# Patient Record
Sex: Female | Born: 1990 | Race: Black or African American | Hispanic: No | Marital: Married | Smoking: Never smoker
Health system: Southern US, Community
[De-identification: ages and names within clinical notes are randomized; demographics above are authoritative.]

## PROBLEM LIST (undated history)

## (undated) ENCOUNTER — Inpatient Hospital Stay (HOSPITAL_COMMUNITY): Payer: Self-pay

## (undated) DIAGNOSIS — K219 Gastro-esophageal reflux disease without esophagitis: Secondary | ICD-10-CM

## (undated) DIAGNOSIS — F419 Anxiety disorder, unspecified: Secondary | ICD-10-CM

## (undated) DIAGNOSIS — J45909 Unspecified asthma, uncomplicated: Secondary | ICD-10-CM

## (undated) DIAGNOSIS — F53 Postpartum depression: Secondary | ICD-10-CM

## (undated) HISTORY — PX: BREAST SURGERY: SHX581

---
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
## 2009-03-17 ENCOUNTER — Other Ambulatory Visit: Admission: RE | Admit: 2009-03-17 | Discharge: 2009-03-17 | Payer: Self-pay | Admitting: Obstetrics and Gynecology

## 2010-03-24 ENCOUNTER — Other Ambulatory Visit: Admission: RE | Admit: 2010-03-24 | Discharge: 2010-03-24 | Payer: Self-pay | Admitting: Obstetrics and Gynecology

## 2011-05-23 ENCOUNTER — Other Ambulatory Visit (HOSPITAL_COMMUNITY)
Admission: RE | Admit: 2011-05-23 | Discharge: 2011-05-23 | Disposition: A | Discharge status: Home / Self Care | Payer: 59 | Source: Ambulatory Visit | Attending: Obstetrics and Gynecology | Admitting: Obstetrics and Gynecology

## 2011-05-23 DIAGNOSIS — Z01419 Encounter for gynecological examination (general) (routine) without abnormal findings: Secondary | ICD-10-CM | POA: Insufficient documentation

## 2011-05-23 DIAGNOSIS — Z113 Encounter for screening for infections with a predominantly sexual mode of transmission: Secondary | ICD-10-CM | POA: Insufficient documentation

## 2012-07-30 ENCOUNTER — Other Ambulatory Visit (HOSPITAL_COMMUNITY)
Admission: RE | Admit: 2012-07-30 | Discharge: 2012-07-30 | Disposition: A | Discharge status: Home / Self Care | Payer: 59 | Source: Ambulatory Visit | Attending: Obstetrics and Gynecology | Admitting: Obstetrics and Gynecology

## 2012-07-30 DIAGNOSIS — Z01419 Encounter for gynecological examination (general) (routine) without abnormal findings: Secondary | ICD-10-CM | POA: Insufficient documentation

## 2014-08-10 ENCOUNTER — Encounter (HOSPITAL_COMMUNITY): Payer: Self-pay

## 2014-08-10 ENCOUNTER — Inpatient Hospital Stay (HOSPITAL_COMMUNITY)
Admission: AD | Admit: 2014-08-10 | Discharge: 2014-08-10 | Disposition: A | Discharge status: Home / Self Care | Payer: BC Managed Care – PPO | Source: Ambulatory Visit | Attending: Obstetrics and Gynecology | Admitting: Obstetrics and Gynecology

## 2014-08-10 DIAGNOSIS — K219 Gastro-esophageal reflux disease without esophagitis: Secondary | ICD-10-CM | POA: Insufficient documentation

## 2014-08-10 DIAGNOSIS — O210 Mild hyperemesis gravidarum: Secondary | ICD-10-CM | POA: Diagnosis not present

## 2014-08-10 DIAGNOSIS — O219 Vomiting of pregnancy, unspecified: Secondary | ICD-10-CM | POA: Diagnosis not present

## 2014-08-10 HISTORY — DX: Unspecified asthma, uncomplicated: J45.909

## 2014-08-10 HISTORY — DX: Anxiety disorder, unspecified: F41.9

## 2014-08-10 HISTORY — DX: Gastro-esophageal reflux disease without esophagitis: K21.9

## 2014-08-10 LAB — URINALYSIS, ROUTINE W REFLEX MICROSCOPIC
Glucose, UA: NEGATIVE mg/dL
Hgb urine dipstick: NEGATIVE
Ketones, ur: >80 mg/dL — AB
Leukocytes, UA: NEGATIVE
Nitrite: NEGATIVE
Protein, ur: NEGATIVE mg/dL
Specific Gravity, Urine: 1.015 (ref 1.005–1.030)
Urobilinogen, UA: 1 mg/dL (ref 0.0–1.0)
pH: 7 (ref 5.0–8.0)

## 2014-08-10 LAB — POCT PREGNANCY, URINE: Preg Test, Ur: POSITIVE — AB

## 2014-08-10 MED ORDER — PROMETHAZINE HCL 25 MG PO TABS: 12.5000 mg | ORAL_TABLET | Freq: Four times a day (QID) | ORAL | Status: DC | PRN

## 2014-08-10 MED ORDER — PROMETHAZINE HCL 25 MG/ML IJ SOLN
25.0000 mg | Freq: Once | INTRAVENOUS | Status: DC
  Filled 2014-08-10: qty 1

## 2014-08-10 NOTE — MAU Provider Note (Signed)
History     CSN: [id number]  Arrival date and time: 08/10/14 1658   First Provider Initiated Contact with Patient 08/10/14 2009      Chief Complaint  Patient presents with  . Possible Pregnancy  . Emesis   Possible Pregnancy Associated symptoms include vomiting.  Emesis     Deborah Juarez is a 23 y.o. G1P0 at [redacted]w[redacted]d who presents today with nausea and vomiting. She has been nauseous for about 5 days, but for the last two days she hasn't been able to "keep anything down". She denies any lower abdominal pain or vaginal bleeding. She has an appointment to start [facility] on Monday, and does not have any rx medications at this time for the nausea and vomiting.   Past Medical History  Diagnosis Date  . Asthma   . Anxiety   . GERD (gastroesophageal reflux disease)     Past Surgical History  Procedure Laterality Date  . Breast surgery      Family History  Problem Relation Age of Onset  . Heart disease Father   . Heart disease Maternal Grandfather   . Heart disease Paternal Grandfather   . Stroke Paternal Grandfather     History  Substance Use Topics  . Smoking status: Never Smoker   . Smokeless tobacco: Never Used  . Alcohol Use: 1.8 oz/week    3 Cans of beer per week    Allergies: No Known Allergies  Prescriptions prior to admission  Medication Sig Dispense Refill  . acetaminophen (TYLENOL) 500 MG tablet Take 500 mg by mouth every 6 (six) hours as needed for headache.      . calcium carbonate (TUMS - DOSED IN MG ELEMENTAL CALCIUM) 500 MG chewable tablet Chew 2 tablets by mouth as needed for indigestion or heartburn.      Marland Kitchen FLUoxetine (PROZAC) 20 MG capsule Take 20 mg by mouth daily.      . Prenatal Vit-Fe Fumarate-FA (PRENATAL MULTIVITAMIN) TABS tablet Take 1 tablet by mouth daily at 12 noon.        Review of Systems  Gastrointestinal: Positive for vomiting.   Physical Exam   Blood pressure 116/76, pulse 102, temperature 98.9 F (37.2 C), temperature source Oral,  resp. rate 16, height  (1.702 m), weight 62.234 kg (137 lb 3.2 oz), last menstrual period 06/15/2014, SpO2 99.00%.  Physical Exam  Nursing note and vitals reviewed. Constitutional: She is oriented to person, place, and time. She appears well-developed and well-nourished. No distress.  Cardiovascular: Normal rate.   Respiratory: Effort normal.  GI: Soft. There is no tenderness. There is no rebound.  Neurological: She is alert and oriented to person, place, and time.  Skin: Skin is warm and dry.  Psychiatric: She has a normal mood and affect.    MAU Course  Procedures Results for orders placed during the hospital encounter of 08/10/14 (from the past 24 hour(s))  URINALYSIS, ROUTINE W REFLEX MICROSCOPIC     Status: Abnormal   Collection Time    08/10/14  5:39 PM      Result Value Ref Range   Color, Urine AMBER (*) YELLOW   APPearance CLEAR  CLEAR   Specific Gravity, Urine 1.015  1.005 - 1.030   pH 7.0  5.0 - 8.0   Glucose, UA NEGATIVE  NEGATIVE mg/dL   Hgb urine dipstick NEGATIVE  NEGATIVE   Bilirubin Urine SMALL (*) NEGATIVE   Ketones, ur >80 (*) NEGATIVE mg/dL   Protein, ur NEGATIVE  NEGATIVE mg/dL  Urobilinogen, UA 1.0  0.0 - 1.0 mg/dL   Nitrite NEGATIVE  NEGATIVE   Leukocytes, UA NEGATIVE  NEGATIVE  POCT PREGNANCY, URINE     Status: Abnormal   Collection Time    08/10/14  5:45 PM      Result Value Ref Range   Preg Test, Ur POSITIVE (*) NEGATIVE   2225: Patient has had 1L D5LR and  phenergan. She reports feeling better. She has been able to eat some pistachios and ice chips. She has also been tolerating PO fluids.  2230: D/W Dr. Richardson Dopp, patient may have rx for dicelgis and phenergan PRN.  2235: Patient would like samples. Instructed to call the office in the morning to arrange for samples of Diclegis.   Assessment and Plan   1. Nausea/vomiting in pregnancy    First trimester precautions reviewed Return to MAU as needed  Follow-up Information   Call Jessee Avers., MD. (for samples in the morning )    Specialty:  Obstetrics and Gynecology   Contact information:   [address] [place] [address] [phone]        Tawnya Crook 08/10/2014, 8:10 PM

## 2014-08-10 NOTE — Discharge Instructions (Signed)

## 2014-08-10 NOTE — MAU Note (Signed)
Pt states she has been nauseated for about 5 days and has had a difficult time keeping anything down over the last couple of days.

## 2014-08-10 NOTE — MAU Note (Signed)
Patient states she has had a positive home pregnancy test. Has had nausea for about one week but the vomiting has been worse for the past 2 days. Denies bleeding or discharge. Has some epigastric pain with the vomiting.

## 2014-08-26 ENCOUNTER — Other Ambulatory Visit: Payer: Self-pay | Admitting: Obstetrics and Gynecology

## 2014-08-26 ENCOUNTER — Other Ambulatory Visit (HOSPITAL_COMMUNITY)
Admission: RE | Admit: 2014-08-26 | Discharge: 2014-08-26 | Disposition: A | Discharge status: Home / Self Care | Payer: BC Managed Care – PPO | Source: Ambulatory Visit | Attending: Obstetrics and Gynecology | Admitting: Obstetrics and Gynecology

## 2014-08-26 DIAGNOSIS — Z01419 Encounter for gynecological examination (general) (routine) without abnormal findings: Secondary | ICD-10-CM | POA: Diagnosis not present

## 2014-08-26 DIAGNOSIS — Z113 Encounter for screening for infections with a predominantly sexual mode of transmission: Secondary | ICD-10-CM | POA: Diagnosis present

## 2014-08-28 LAB — CYTOLOGY - PAP

## 2014-09-21 ENCOUNTER — Encounter (HOSPITAL_COMMUNITY): Payer: Self-pay

## 2015-03-03 ENCOUNTER — Inpatient Hospital Stay (HOSPITAL_COMMUNITY): Payer: Medicaid Other | Admitting: Certified Registered Nurse Anesthetist

## 2015-03-03 ENCOUNTER — Inpatient Hospital Stay (HOSPITAL_COMMUNITY)
Admission: AD | Admit: 2015-03-03 | Discharge: 2015-03-06 | DRG: 765 | Disposition: A | Discharge status: Home / Self Care | Payer: Medicaid Other | Source: Ambulatory Visit | Attending: Obstetrics & Gynecology | Admitting: Obstetrics & Gynecology

## 2015-03-03 ENCOUNTER — Encounter (HOSPITAL_COMMUNITY)
Admission: AD | Disposition: A | Discharge status: Home / Self Care | Payer: Self-pay | Source: Ambulatory Visit | Attending: Obstetrics & Gynecology

## 2015-03-03 ENCOUNTER — Encounter (HOSPITAL_COMMUNITY): Payer: Self-pay | Admitting: *Deleted

## 2015-03-03 DIAGNOSIS — Z3A36 36 weeks gestation of pregnancy: Secondary | ICD-10-CM | POA: Diagnosis present

## 2015-03-03 DIAGNOSIS — O321XX0 Maternal care for breech presentation, not applicable or unspecified: Secondary | ICD-10-CM | POA: Diagnosis present

## 2015-03-03 DIAGNOSIS — J45909 Unspecified asthma, uncomplicated: Secondary | ICD-10-CM | POA: Diagnosis not present

## 2015-03-03 DIAGNOSIS — Z3403 Encounter for supervision of normal first pregnancy, third trimester: Secondary | ICD-10-CM | POA: Diagnosis present

## 2015-03-03 DIAGNOSIS — Z98891 History of uterine scar from previous surgery: Secondary | ICD-10-CM

## 2015-03-03 DIAGNOSIS — F419 Anxiety disorder, unspecified: Secondary | ICD-10-CM | POA: Diagnosis not present

## 2015-03-03 LAB — CBC
HCT: 35.4 % — ABNORMAL LOW (ref 36.0–46.0)
Hemoglobin: 11.7 g/dL — ABNORMAL LOW (ref 12.0–15.0)
MCH: 28.1 pg (ref 26.0–34.0)
MCHC: 33.1 g/dL (ref 30.0–36.0)
MCV: 84.9 fL (ref 78.0–100.0)
Platelets: 215 10*3/uL (ref 150–400)
RBC: 4.17 MIL/uL (ref 3.87–5.11)
RDW: 13.1 % (ref 11.5–15.5)
WBC: 8.7 10*3/uL (ref 4.0–10.5)

## 2015-03-03 LAB — TYPE AND SCREEN
ABO/RH(D): B POS
Antibody Screen: NEGATIVE

## 2015-03-03 LAB — RPR: RPR Ser Ql: NONREACTIVE

## 2015-03-03 LAB — ABO/RH: ABO/RH(D): B POS

## 2015-03-03 SURGERY — Surgical Case
Anesthesia: Spinal

## 2015-03-03 MED ORDER — MORPHINE SULFATE 0.5 MG/ML IJ SOLN
INTRAMUSCULAR | Status: AC
  Filled 2015-03-03: qty 10

## 2015-03-03 MED ORDER — NALBUPHINE HCL 10 MG/ML IJ SOLN
5.0000 mg | Freq: Once | INTRAMUSCULAR | Status: AC | PRN
  Administered 2015-03-03: 5 mg via SUBCUTANEOUS

## 2015-03-03 MED ORDER — LACTATED RINGERS IV SOLN
INTRAVENOUS | Status: DC
  Administered 2015-03-03 (×3): via INTRAVENOUS

## 2015-03-03 MED ORDER — BUPIVACAINE IN DEXTROSE 0.75-8.25 % IT SOLN
INTRATHECAL | Status: DC | PRN
  Administered 2015-03-03: 1.7 mL via INTRATHECAL

## 2015-03-03 MED ORDER — MORPHINE SULFATE (PF) 0.5 MG/ML IJ SOLN
INTRAMUSCULAR | Status: DC | PRN
  Administered 2015-03-03: .15 mg via INTRATHECAL

## 2015-03-03 MED ORDER — ONDANSETRON HCL 4 MG/2ML IJ SOLN
INTRAMUSCULAR | Status: AC
  Filled 2015-03-03: qty 2

## 2015-03-03 MED ORDER — METOCLOPRAMIDE HCL 5 MG/ML IJ SOLN
INTRAMUSCULAR | Status: AC
  Administered 2015-03-03: 10 mg via INTRAVENOUS
  Filled 2015-03-03: qty 2

## 2015-03-03 MED ORDER — ONDANSETRON HCL 4 MG/2ML IJ SOLN
INTRAMUSCULAR | Status: DC | PRN
Start: 2015-03-03 — End: 2015-03-03
  Administered 2015-03-03: 4 mg via INTRAVENOUS

## 2015-03-03 MED ORDER — ONDANSETRON HCL 4 MG/2ML IJ SOLN
4.0000 mg | Freq: Three times a day (TID) | INTRAMUSCULAR | Status: DC | PRN
  Administered 2015-03-03: 4 mg via INTRAVENOUS
  Filled 2015-03-03: qty 2

## 2015-03-03 MED ORDER — MEPERIDINE HCL 25 MG/ML IJ SOLN: 6.2500 mg | INTRAMUSCULAR | Status: DC | PRN

## 2015-03-03 MED ORDER — PHENYLEPHRINE 8 MG IN D5W 100 ML (0.08MG/ML) PREMIX OPTIME
INJECTION | INTRAVENOUS | Status: AC
  Filled 2015-03-03: qty 100

## 2015-03-03 MED ORDER — LACTATED RINGERS IV SOLN
INTRAVENOUS | Status: DC | PRN
  Administered 2015-03-03: 09:00:00 via INTRAVENOUS

## 2015-03-03 MED ORDER — MENTHOL 3 MG MT LOZG
1.0000 | LOZENGE | OROMUCOSAL | Status: DC | PRN
Start: 2015-03-03 — End: 2015-03-06

## 2015-03-03 MED ORDER — FAMOTIDINE IN NACL 20-0.9 MG/50ML-% IV SOLN
20.0000 mg | Freq: Once | INTRAVENOUS | Status: AC
  Administered 2015-03-03: 20 mg via INTRAVENOUS
  Filled 2015-03-03: qty 50

## 2015-03-03 MED ORDER — OXYCODONE-ACETAMINOPHEN 5-325 MG PO TABS
1.0000 | ORAL_TABLET | ORAL | Status: DC | PRN
  Administered 2015-03-04 – 2015-03-06 (×5): 1 via ORAL
  Filled 2015-03-03 (×6): qty 1

## 2015-03-03 MED ORDER — NALBUPHINE HCL 10 MG/ML IJ SOLN: 5.0000 mg | INTRAMUSCULAR | Status: DC | PRN

## 2015-03-03 MED ORDER — ZOLPIDEM TARTRATE 5 MG PO TABS: 5.0000 mg | ORAL_TABLET | Freq: Every evening | ORAL | Status: DC | PRN

## 2015-03-03 MED ORDER — IBUPROFEN 600 MG PO TABS
600.0000 mg | ORAL_TABLET | Freq: Four times a day (QID) | ORAL | Status: DC
  Administered 2015-03-04 – 2015-03-06 (×11): 600 mg via ORAL
  Filled 2015-03-03 (×11): qty 1

## 2015-03-03 MED ORDER — CEFAZOLIN SODIUM-DEXTROSE 2-3 GM-% IV SOLR
2.0000 g | INTRAVENOUS | Status: AC
  Administered 2015-03-03: 2 g via INTRAVENOUS
  Filled 2015-03-03: qty 50

## 2015-03-03 MED ORDER — LANOLIN HYDROUS EX OINT
1.0000 | TOPICAL_OINTMENT | CUTANEOUS | Status: DC | PRN
Start: 2015-03-03 — End: 2015-03-06

## 2015-03-03 MED ORDER — DIPHENHYDRAMINE HCL 50 MG/ML IJ SOLN
12.5000 mg | INTRAMUSCULAR | Status: DC | PRN
Start: 2015-03-03 — End: 2015-03-06

## 2015-03-03 MED ORDER — SENNOSIDES-DOCUSATE SODIUM 8.6-50 MG PO TABS
2.0000 | ORAL_TABLET | ORAL | Status: DC
  Administered 2015-03-04 – 2015-03-05 (×3): 2 via ORAL
  Filled 2015-03-03 (×3): qty 2

## 2015-03-03 MED ORDER — SIMETHICONE 80 MG PO CHEW
80.0000 mg | CHEWABLE_TABLET | Freq: Three times a day (TID) | ORAL | Status: DC
  Administered 2015-03-04 – 2015-03-06 (×8): 80 mg via ORAL
  Filled 2015-03-03 (×9): qty 1

## 2015-03-03 MED ORDER — LACTATED RINGERS IV SOLN
INTRAVENOUS | Status: DC
  Administered 2015-03-03: 23:00:00 via INTRAVENOUS

## 2015-03-03 MED ORDER — NALOXONE HCL 1 MG/ML IJ SOLN
1.0000 ug/kg/h | INTRAVENOUS | Status: DC | PRN
  Filled 2015-03-03: qty 2

## 2015-03-03 MED ORDER — WITCH HAZEL-GLYCERIN EX PADS: 1.0000 "application " | MEDICATED_PAD | CUTANEOUS | Status: DC | PRN

## 2015-03-03 MED ORDER — FENTANYL CITRATE 0.05 MG/ML IJ SOLN
INTRAMUSCULAR | Status: DC | PRN
  Administered 2015-03-03: 25 ug via INTRATHECAL

## 2015-03-03 MED ORDER — SIMETHICONE 80 MG PO CHEW
80.0000 mg | CHEWABLE_TABLET | ORAL | Status: DC
  Administered 2015-03-04 – 2015-03-05 (×3): 80 mg via ORAL
  Filled 2015-03-03 (×3): qty 1

## 2015-03-03 MED ORDER — IBUPROFEN 600 MG PO TABS: 600.0000 mg | ORAL_TABLET | Freq: Four times a day (QID) | ORAL | Status: DC | PRN

## 2015-03-03 MED ORDER — DIPHENHYDRAMINE HCL 25 MG PO CAPS: 25.0000 mg | ORAL_CAPSULE | ORAL | Status: DC | PRN

## 2015-03-03 MED ORDER — DIPHENHYDRAMINE HCL 25 MG PO CAPS: 25.0000 mg | ORAL_CAPSULE | Freq: Four times a day (QID) | ORAL | Status: DC | PRN

## 2015-03-03 MED ORDER — MEPERIDINE HCL 25 MG/ML IJ SOLN
INTRAMUSCULAR | Status: DC | PRN
  Administered 2015-03-03: 12.5 mg via INTRAVENOUS

## 2015-03-03 MED ORDER — PHENYLEPHRINE HCL 10 MG/ML IJ SOLN
INTRAMUSCULAR | Status: DC | PRN
  Administered 2015-03-03: 80 ug via INTRAVENOUS

## 2015-03-03 MED ORDER — NALBUPHINE HCL 10 MG/ML IJ SOLN: 5.0000 mg | Freq: Once | INTRAMUSCULAR | Status: AC | PRN

## 2015-03-03 MED ORDER — PHENYLEPHRINE 40 MCG/ML (10ML) SYRINGE FOR IV PUSH (FOR BLOOD PRESSURE SUPPORT)
PREFILLED_SYRINGE | INTRAVENOUS | Status: AC
  Filled 2015-03-03: qty 10

## 2015-03-03 MED ORDER — KETOROLAC TROMETHAMINE 30 MG/ML IJ SOLN
30.0000 mg | Freq: Four times a day (QID) | INTRAMUSCULAR | Status: AC | PRN
  Administered 2015-03-03: 30 mg via INTRAMUSCULAR

## 2015-03-03 MED ORDER — DIBUCAINE 1 % RE OINT: 1.0000 "application " | TOPICAL_OINTMENT | RECTAL | Status: DC | PRN

## 2015-03-03 MED ORDER — OXYTOCIN 10 UNIT/ML IJ SOLN
INTRAMUSCULAR | Status: AC
  Filled 2015-03-03: qty 4

## 2015-03-03 MED ORDER — CITRIC ACID-SODIUM CITRATE 334-500 MG/5ML PO SOLN
30.0000 mL | Freq: Once | ORAL | Status: AC
  Administered 2015-03-03: 30 mL via ORAL
  Filled 2015-03-03: qty 15

## 2015-03-03 MED ORDER — SODIUM CHLORIDE 0.9 % IJ SOLN: 3.0000 mL | INTRAMUSCULAR | Status: DC | PRN

## 2015-03-03 MED ORDER — KETOROLAC TROMETHAMINE 30 MG/ML IJ SOLN: 30.0000 mg | Freq: Four times a day (QID) | INTRAMUSCULAR | Status: AC | PRN

## 2015-03-03 MED ORDER — PRENATAL MULTIVITAMIN CH
1.0000 | ORAL_TABLET | Freq: Every day | ORAL | Status: DC
  Administered 2015-03-04 – 2015-03-06 (×3): 1 via ORAL
  Filled 2015-03-03 (×3): qty 1

## 2015-03-03 MED ORDER — OXYTOCIN 40 UNITS IN LACTATED RINGERS INFUSION - SIMPLE MED: 62.5000 mL/h | INTRAVENOUS | Status: AC

## 2015-03-03 MED ORDER — FENTANYL CITRATE 0.05 MG/ML IJ SOLN
INTRAMUSCULAR | Status: AC
  Filled 2015-03-03: qty 2

## 2015-03-03 MED ORDER — PHENYLEPHRINE 8 MG IN D5W 100 ML (0.08MG/ML) PREMIX OPTIME
INJECTION | INTRAVENOUS | Status: DC | PRN
  Administered 2015-03-03: 60 ug/min via INTRAVENOUS

## 2015-03-03 MED ORDER — NALOXONE HCL 0.4 MG/ML IJ SOLN: 0.4000 mg | INTRAMUSCULAR | Status: DC | PRN

## 2015-03-03 MED ORDER — MEPERIDINE HCL 25 MG/ML IJ SOLN
INTRAMUSCULAR | Status: AC
  Filled 2015-03-03: qty 1

## 2015-03-03 MED ORDER — ACETAMINOPHEN 325 MG PO TABS: 650.0000 mg | ORAL_TABLET | ORAL | Status: DC | PRN

## 2015-03-03 MED ORDER — OXYCODONE-ACETAMINOPHEN 5-325 MG PO TABS: 2.0000 | ORAL_TABLET | ORAL | Status: DC | PRN

## 2015-03-03 MED ORDER — NALBUPHINE HCL 10 MG/ML IJ SOLN
INTRAMUSCULAR | Status: AC
  Administered 2015-03-03: 5 mg via SUBCUTANEOUS
  Filled 2015-03-03: qty 1

## 2015-03-03 MED ORDER — FENTANYL CITRATE 0.05 MG/ML IJ SOLN: 25.0000 ug | INTRAMUSCULAR | Status: DC | PRN

## 2015-03-03 MED ORDER — METOCLOPRAMIDE HCL 5 MG/ML IJ SOLN
10.0000 mg | Freq: Once | INTRAMUSCULAR | Status: AC
  Administered 2015-03-03: 10 mg via INTRAVENOUS

## 2015-03-03 MED ORDER — PROMETHAZINE HCL 25 MG/ML IJ SOLN
6.2500 mg | INTRAMUSCULAR | Status: DC | PRN
  Administered 2015-03-03: 6.25 mg via INTRAVENOUS
  Filled 2015-03-03: qty 1

## 2015-03-03 MED ORDER — SCOPOLAMINE 1 MG/3DAYS TD PT72
1.0000 | MEDICATED_PATCH | Freq: Once | TRANSDERMAL | Status: DC
  Filled 2015-03-03: qty 1

## 2015-03-03 MED ORDER — KETOROLAC TROMETHAMINE 30 MG/ML IJ SOLN
INTRAMUSCULAR | Status: AC
  Filled 2015-03-03: qty 1

## 2015-03-03 MED ORDER — TETANUS-DIPHTH-ACELL PERTUSSIS 5-2.5-18.5 LF-MCG/0.5 IM SUSP: 0.5000 mL | Freq: Once | INTRAMUSCULAR | Status: DC

## 2015-03-03 MED ORDER — LACTATED RINGERS IV BOLUS (SEPSIS)
1000.0000 mL | Freq: Once | INTRAVENOUS | Status: AC
  Administered 2015-03-03: 1000 mL via INTRAVENOUS

## 2015-03-03 MED ORDER — SIMETHICONE 80 MG PO CHEW: 80.0000 mg | CHEWABLE_TABLET | ORAL | Status: DC | PRN

## 2015-03-03 MED ORDER — OXYTOCIN 10 UNIT/ML IJ SOLN
40.0000 [IU] | INTRAVENOUS | Status: DC | PRN
  Administered 2015-03-03: 40 [IU] via INTRAVENOUS

## 2015-03-03 SURGICAL SUPPLY — 35 items
BENZOIN TINCTURE PRP APPL 2/3 (GAUZE/BANDAGES/DRESSINGS) ×3 IMPLANT
CLAMP CORD UMBIL (MISCELLANEOUS) IMPLANT
CLOSURE WOUND 1/2 X4 (GAUZE/BANDAGES/DRESSINGS) ×1
CLOTH BEACON ORANGE TIMEOUT ST (SAFETY) ×3 IMPLANT
DRAPE SHEET LG 3/4 BI-LAMINATE (DRAPES) IMPLANT
DRSG OPSITE POSTOP 4X10 (GAUZE/BANDAGES/DRESSINGS) ×3 IMPLANT
DURAPREP 26ML APPLICATOR (WOUND CARE) ×3 IMPLANT
ELECT REM PT RETURN 9FT ADLT (ELECTROSURGICAL) ×3
ELECTRODE REM PT RTRN 9FT ADLT (ELECTROSURGICAL) ×1 IMPLANT
EXTRACTOR VACUUM KIWI (MISCELLANEOUS) IMPLANT
GLOVE BIOGEL PI IND STRL 6.5 (GLOVE) ×1 IMPLANT
GLOVE BIOGEL PI INDICATOR 6.5 (GLOVE) ×2
GLOVE ECLIPSE 6.5 STRL STRAW (GLOVE) ×3 IMPLANT
GOWN STRL REUS W/TWL LRG LVL3 (GOWN DISPOSABLE) ×6 IMPLANT
HEMOSTAT SURGICEL 4X8 (HEMOSTASIS) ×3 IMPLANT
KIT ABG SYR 3ML LUER SLIP (SYRINGE) IMPLANT
LIQUID BAND (GAUZE/BANDAGES/DRESSINGS) IMPLANT
NEEDLE HYPO 25X5/8 SAFETYGLIDE (NEEDLE) IMPLANT
NS IRRIG 1000ML POUR BTL (IV SOLUTION) ×3 IMPLANT
PACK C SECTION WH (CUSTOM PROCEDURE TRAY) ×3 IMPLANT
PAD ABD 7.5X8 STRL (GAUZE/BANDAGES/DRESSINGS) ×3 IMPLANT
PAD OB MATERNITY 4.3X12.25 (PERSONAL CARE ITEMS) ×3 IMPLANT
RTRCTR C-SECT PINK 25CM LRG (MISCELLANEOUS) ×3 IMPLANT
STRIP CLOSURE SKIN 1/2X4 (GAUZE/BANDAGES/DRESSINGS) ×2 IMPLANT
SUT PLAIN 0 NONE (SUTURE) IMPLANT
SUT PLAIN 2 0 XLH (SUTURE) IMPLANT
SUT VIC AB 0 CT1 27 (SUTURE) ×4
SUT VIC AB 0 CT1 27XBRD ANBCTR (SUTURE) ×2 IMPLANT
SUT VIC AB 0 CTX 36 (SUTURE) ×8
SUT VIC AB 0 CTX36XBRD ANBCTRL (SUTURE) ×4 IMPLANT
SUT VIC AB 2-0 CT1 27 (SUTURE) ×2
SUT VIC AB 2-0 CT1 TAPERPNT 27 (SUTURE) ×1 IMPLANT
SUT VIC AB 4-0 KS 27 (SUTURE) ×3 IMPLANT
TOWEL OR 17X24 6PK STRL BLUE (TOWEL DISPOSABLE) ×3 IMPLANT
TRAY FOLEY CATH SILVER 14FR (SET/KITS/TRAYS/PACK) IMPLANT

## 2015-03-03 NOTE — MAU Note (Signed)
Dr. Dion BodyVarnado to call Dr. Charlotta Newtonzan who will either order u/s or come and scan patient.

## 2015-03-03 NOTE — Transfer of Care (Signed)
Immediate Anesthesia Transfer of Care Note  Patient: Deborah Juarez  Procedure(s) Performed: Procedure(s): CESAREAN SECTION (N/A)  Patient Location: PACU  Anesthesia Type:Spinal  Level of Consciousness: awake, alert , oriented and patient cooperative  Airway & Oxygen Therapy: Patient Spontanous Breathing  Post-op Assessment: Report given to RN and Post -op Vital signs reviewed and stable  Post vital signs: Reviewed and stable  Last Vitals:  Filed Vitals:   03/03/15 1015  BP: 103/70  Pulse: 108  Temp:   Resp: 22    Complications: No apparent anesthesia complications

## 2015-03-03 NOTE — Anesthesia Postprocedure Evaluation (Signed)
  Anesthesia Post-op Note  Patient: Deborah Juarez  Procedure(s) Performed: Procedure(s): CESAREAN SECTION (N/A)  Patient Location: Mother/Baby  Anesthesia Type:Spinal  Level of Consciousness: awake, alert , oriented and patient cooperative  Airway and Oxygen Therapy: Patient Spontanous Breathing  Post-op Pain: none  Post-op Assessment: Post-op Vital signs reviewed, Patient's Cardiovascular Status Stable, Respiratory Function Stable, Patent Airway, No signs of Nausea or vomiting, Adequate PO intake, Pain level controlled, No headache, No backache, No residual numbness and No residual motor weakness  Post-op Vital Signs: Reviewed and stable  Last Vitals:  Filed Vitals:   03/03/15 1245  BP: 137/92  Pulse: 82  Temp: 36.4 C  Resp: 20    Complications: No apparent anesthesia complications

## 2015-03-03 NOTE — MAU Note (Signed)
Pt states she has been having contractions since yesterday.

## 2015-03-03 NOTE — MAU Note (Signed)
Dr. Charlotta Newtonzan to come and scan patient.

## 2015-03-03 NOTE — Addendum Note (Signed)
Addendum  created 03/03/15 1351 by Suella Groveoderick C Tylyn Derwin, CRNA   Modules edited: Notes Section   Notes Section:  File: [id number]

## 2015-03-03 NOTE — Anesthesia Procedure Notes (Signed)
Spinal Patient location during procedure: OR Start time: 03/03/2015 8:45 AM Staffing Anesthesiologist: Mal AmabileFOSTER, Khori Rosevear Performed by: anesthesiologist  Preanesthetic Checklist Completed: patient identified, site marked, surgical consent, pre-op evaluation, timeout performed, IV checked, risks and benefits discussed and monitors and equipment checked Spinal Block Patient position: sitting Prep: site prepped and draped and DuraPrep Patient monitoring: heart rate, cardiac monitor, continuous pulse ox and blood pressure Approach: midline Location: L4-5 Injection technique: single-shot Needle Needle type: Sprotte  Needle gauge: 24 G Needle length: 9 cm Needle insertion depth: 5 cm Assessment Sensory level: T4 Additional Notes Patient tolerated procedure well. Adequate sensory level.

## 2015-03-03 NOTE — Anesthesia Postprocedure Evaluation (Signed)
  Anesthesia Post-op Note  Patient: Deborah Juarez  Procedure(s) Performed: Procedure(s): CESAREAN SECTION (N/A)  Patient Location: PACU  Anesthesia Type:Spinal  Level of Consciousness: awake, alert  and oriented  Airway and Oxygen Therapy: Patient Spontanous Breathing  Post-op Pain: none  Post-op Assessment: Post-op Vital signs reviewed, Patient's Cardiovascular Status Stable, Respiratory Function Stable, Patent Airway, No signs of Nausea or vomiting, Pain level controlled, No headache, No backache, No residual numbness and No residual motor weakness  Post-op Vital Signs: Reviewed and stable  Last Vitals:  Filed Vitals:   03/03/15 1100  BP: 149/115  Pulse: 68  Temp:   Resp: 24    Complications: No apparent anesthesia complications

## 2015-03-03 NOTE — Anesthesia Preprocedure Evaluation (Signed)
Anesthesia Evaluation  Patient identified by MRN, date of birth, ID band Patient awake    Reviewed: Allergy & Precautions, NPO status , Patient's Chart, lab work & pertinent test results  Airway Mallampati: II  TM Distance: >3 FB Neck ROM: Full    Dental no notable dental hx. (+) Teeth Intact   Pulmonary asthma ,  breath sounds clear to auscultation  Pulmonary exam normal       Cardiovascular negative cardio ROS  Rhythm:Regular Rate:Normal     Neuro/Psych Anxiety negative neurological ROS     GI/Hepatic Neg liver ROS, GERD-  Medicated and Controlled,  Endo/Other  negative endocrine ROS  Renal/GU negative Renal ROS  negative genitourinary   Musculoskeletal negative musculoskeletal ROS (+)   Abdominal Normal abdominal exam  (+)   Peds  Hematology negative hematology ROS (+)   Anesthesia Other Findings   Reproductive/Obstetrics (+) Pregnancy Breech presentation 37 weeks in labor                             Anesthesia Physical Anesthesia Plan  ASA: II and emergent  Anesthesia Plan: Spinal   Post-op Pain Management:    Induction:   Airway Management Planned: Natural Airway  Additional Equipment:   Intra-op Plan:   Post-operative Plan:   Informed Consent: I have reviewed the patients History and Physical, chart, labs and discussed the procedure including the risks, benefits and alternatives for the proposed anesthesia with the patient or authorized representative who has indicated his/her understanding and acceptance.     Plan Discussed with: Anesthesiologist, CRNA and Surgeon  Anesthesia Plan Comments:         Anesthesia Quick Evaluation

## 2015-03-03 NOTE — Op Note (Signed)
PreOp Diagnosis: Intrauterine pregnancy @ 9150w4d, Fetal malpresentation, Latent labor PostOp Diagnosis: same Procedure: Primary LTCS Surgeon: Dr. Myna HidalgoJennifer Kysean Juarez Anesthesia: spinal Complications: none EBL: 600cc UOP: 500cc Fluids: 2900  INDICATIONS: 23yo G1P0@ 5050w4d who presented to MAU in latent labor.  On examination, fetal parts were noted on the cervical check with dilation of 2cm and bedside ultrasound confirmed transverse presentation.  Due to fetal malpresentation and labor, the patient was taken for a primary C-section.  Findings: Breech presentation, normal uterus, tubes and ovaries bilaterally  PROCEDURE:  Informed consent was obtained from the patient with risks, benefits, complications, treatment options, and expected outcomes discussed with the patient.  The patient concurred with the proposed plan, giving informed consent with form signed.   The patient was taken to Operating Room, and identified with the procedure verified as C-Section Delivery with Time Out. With induction of anesthesia, the patient was prepped and draped in the usual sterile fashion. A Pfannenstiel incision was made and carried down through the subcutaneous tissue to the fascia. The fascia was incised in the midline and extended transversely. The superior aspect of the fascial incision was grasped with Kochers elevated and the underlying muscle dissected off. The inferior aspect of the facial incision was in similar fashion, grasped elevated and rectus muscles dissected off. The peritoneum was identified and entered. Peritoneal incision was extended longitudinally. The Alexis retractor was placed.  The utero-vesical peritoneal reflection was identified and incised transversely with the [employer] scissors, the incision extended laterally, the bladder flap created digitally.A low transverse uterine incision was made and the infants head delivered atraumatically. After the umbilical cord was clamped and cut cord blood was  obtained for evaluation.   The placenta was removed intact and appeared normal. The uterine outline, tubes and ovaries appeared normal. The uterine incision was closed with running locked sutures of 0 Vicryl and a second layer of the same stitch was used in an imbricating fashion.  Additional 0-vicryl interrupted stitches were placed for adequate hemostasis.  Surigcel was placed over the hysterotomy.  Excellent hemostasis was obtained.  The pericolic gutters were then cleared of all clots and debris.  The peritoneum was closed with 2-0 vicryl in a running fashion.  The fascia was then reapproximated with running sutures of 0 Vicryl. The skin was closed with 4-0 vicryl in a subcuticular fashion.  Instrument, sponge, and needle counts were correct prior the abdominal closure and at the conclusion of the case. The patient was taken to recovery in stable condition.  Myna HidalgoJennifer Graycie Halley, DO [phone] (pager) [phone] (office)

## 2015-03-03 NOTE — H&P (Signed)
Deborah Juarez is a 24 y.o. female 731P0@ 8316w4d who presents for latent labor.  Pt reports regular contractions that started lat evening yesterday.  No LOF, no vaginal bleeding, +fetal movement.  Pregnancy uncomplicated.    Maternal Medical History:  Reason for admission: Contractions.   Contractions: Onset was 6-12 hours ago.   Frequency: regular.   Perceived severity is moderate.    Fetal activity: Perceived fetal activity is normal.      OB History    Gravida Para Term Preterm AB TAB SAB Ectopic Multiple Living   1              Past Medical History  Diagnosis Date  . Asthma   . Anxiety   . GERD (gastroesophageal reflux disease)    Past Surgical History  Procedure Laterality Date  . Breast surgery     Family History: family history includes Heart disease in her father, maternal grandfather, and paternal grandfather; Stroke in her paternal grandfather. Social History:  reports that she has never smoked. She has never used smokeless tobacco. She reports that she does not drink alcohol or use illicit drugs.   Prenatal Transfer Tool  Maternal Diabetes: No Genetic Screening: Declined Maternal Ultrasounds/Referrals: Normal Fetal Ultrasounds or other Referrals:  None Maternal Substance Abuse:  No Significant Maternal Medications: none Significant Maternal Lab Results:  Lab values include: Group B Strep negative Other Comments:  None  Review of Systems  Constitutional: Negative.   HENT: Negative.   Eyes: Negative.   Skin: Negative.     Dilation: 2 Effacement (%): 70 Exam by:: Denise Collison RN Blood pressure 119/88, pulse 84, temperature 98 F (36.7 C), temperature source Oral, resp. rate 16, height 5' 6.75" (1.695 m), weight 77.111 kg (170 lb), last menstrual period 06/15/2014, SpO2 100 %. Exam Physical Exam  Gen: NAD CV: RRR Abd: soft, non-tender Ext: 1+ non-pitting edema, no calf tenderness bilaterally BSUS: Transverse lie  Prenatal labs: ABO, Rh:  B  positive Antibody:  negative Rubella:  immune RPR:   NR HBsAg:   negative HIV:   negative GBS:   negative  Assessment/Plan: 23yo G1P0@ 6416w4d who presents in latent labor with fetal malpresentation. -NPO -IV: LR @ 125cc/hr -SCDs to OR -Ancef to OR Risk benefits and alternatives of cesarean section were discussed with the patient including but not limited to infection, bleeding, damage to bowel , bladder and baby with the need for further surgery. Pt voiced understanding and desires to proceed.   Myna HidalgoZAN, Keshona Kartes, M 03/03/2015, 7:25 AM

## 2015-03-04 ENCOUNTER — Encounter (HOSPITAL_COMMUNITY): Payer: Self-pay | Admitting: Obstetrics & Gynecology

## 2015-03-04 LAB — CBC
HEMATOCRIT: 27.5 % — AB (ref 36.0–46.0)
Hemoglobin: 9.1 g/dL — ABNORMAL LOW (ref 12.0–15.0)
MCH: 28.1 pg (ref 26.0–34.0)
MCHC: 33.1 g/dL (ref 30.0–36.0)
MCV: 84.9 fL (ref 78.0–100.0)
Platelets: 180 10*3/uL (ref 150–400)
RBC: 3.24 MIL/uL — ABNORMAL LOW (ref 3.87–5.11)
RDW: 12.9 % (ref 11.5–15.5)
WBC: 10.8 10*3/uL — ABNORMAL HIGH (ref 4.0–10.5)

## 2015-03-04 NOTE — Lactation Note (Signed)
This note was copied from the chart of Deborah Holy Juarez. Lactation Consultation Note  Mom is putting baby to breast and pumping every 3 hours.  No milk obtained yet and I reassured mom this is normal.  Encouraged to continue pumping to assist with establishing milk supply.  Parents state they can't get the baby to take the supplement.  I worked with the baby for 30 minutes and he only took 7 mls.  Baby did very little sucking.  Baby was straining to stool during feeding and did then pass meconium stool and voided.  Baby starting to cue after diaper change and mom will put baby back to breast.  Encouraged mom to call for assist.  Patient Name: Deborah Juarez ZOXWR'UToday's Date: 03/04/2015 Reason for consult: Follow-up assessment;Infant < 6lbs;Late preterm infant   Maternal Data    Feeding Feeding Type: Breast Fed Nipple Type: Slow - flow Length of feed: 5 min  LATCH Score/Interventions                      Lactation Tools Discussed/Used     Consult Status Consult Status: Follow-up Date: 03/05/15 Follow-up type: In-patient    Huston FoleyMOULDEN, Ainslee Sou S 03/04/2015, 3:19 PM

## 2015-03-04 NOTE — Progress Notes (Signed)
Ur chart review completed.  

## 2015-03-04 NOTE — Clinical Social Work Psychosocial (Signed)
Clinical Social Work Department BRIEF PSYCHOSOCIAL ASSESSMENT 03/04/2015  Patient:  Deborah Juarez,Deborah Juarez     Account Number:  [id number]     Admit date:  03/03/2015  Clinical Social Worker:  Juelz Whittenberg, LCSWA  Date/Time:  03/04/2015 01:34 PM  Referred by:  Physician  Date Referred:  03/04/2015 Referred for  Other - See comment   Other Referral:   referred for history of anxiety/depression   Interview type:  Patient Other interview type:   FOB also at bedside    PSYCHOSOCIAL DATA Living Status:  FAMILY Admitted from facility:   Level of care:   Primary support name:  spouse Primary support relationship to patient:   Degree of support available:   good    CURRENT CONCERNS Current Concerns  None Noted   Other Concerns:    SOCIAL WORK ASSESSMENT / PLAN CSW met with MOB and FOB at bedside to discuss her history of anxiety/depression. MOB/patient reports she had significant depression and anxiety in the past but found when she changed jobs she became much better. 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 She reports taking prozac most recently but stopped that about 1.5 years ago. She and her spouse/FOB both acknowledged and articulated conversations they have already begun to have about this- they appear to have a good awareness of what to look for. CSW reminded them of signs/symptoms towatch for related to PPD and anxiety as well as her increased risk with history.  Both MOB and FOB were very receptive to conversation and appear to have a good awareness of what to watch for.They are excited to be new parents and are prepared wtih all supplies and baby items at home. "we just had our baby shower last weekend".  MOB reports massage school has been a positive change for her as well as taught her new ways to relax and cope with stress/anxiety.   Assessment/plan status:  No Further Intervention Required Other assessment/ plan:   Information/referral to community resources:     PATIENT'S/FAMILY'S RESPONSE TO PLAN OF CARE: CSW encouraged both MOB and FOB to be aware of signs or concerns related to increase stress, anxiety and depression/PPD. CSW also encouraged MOB to seek RX if needed via her PCP or Dr Kaur (Psychiatrist she has seen in past) if needs arise.    Juwuan Sedita, MSW, LCSWA     

## 2015-03-04 NOTE — Lactation Note (Signed)
This note was copied from the chart of Deborah Juarez. Lactation Consultation Note LPI 36 4/7 weeks. LPI information sheet given and reviewed. Baby has no interest in BF or bottle feeding at this time. Abd. Distended baby gaggy.  Jittery blood sugars good after birth. Instructed mom on monitoring. Reported to RN about jitteriness.  Hand expression taught, 2ml collected gave to baby via syring. Stressed importance of I&O. Mom encouraged to feed baby 8-12 times/24 hours and with feeding cues. Mom encouraged to waken baby for feeds. Mom reports + breast changes w/pregnancy. Educated about LPI newborn behavior. Referred to Baby and Me Book in Breastfeeding section Pg. 22-23 for position options and Proper latch demonstration. Mom encouraged to do skin-to-skin. Mom shown how to use DEBP & how to disassemble, clean, & reassemble parts. Mom knows to pump q3h for 15-20 min.  [facility]/LC brochure given w/resources, support groups and LC services.  Patient Name: Deborah Juarez ZOXWR'UToday's Date: 03/04/2015 Reason for consult: Initial assessment;Difficult latch   Maternal Data Has patient been taught Hand Expression?: Yes Does the patient have breastfeeding experience prior to this delivery?: No  Feeding Feeding Type: Breast Milk Nipple Type: Slow - flow Length of feed: 0 min  LATCH Score/Interventions Latch: Too sleepy or reluctant, no latch achieved, no sucking elicited. Intervention(s): Skin to skin;Teach feeding cues;Waking techniques Intervention(s): Adjust position;Assist with latch;Breast massage;Breast compression  Audible Swallowing: None Intervention(s): Skin to skin;Hand expression Intervention(s): Hand expression;Alternate breast massage  Type of Nipple: Everted at rest and after stimulation  Comfort (Breast/Nipple): Soft / non-tender     Hold (Positioning): Assistance needed to correctly position infant at breast and maintain latch. Intervention(s): Breastfeeding basics  reviewed;Support Pillows;Position options;Skin to skin  LATCH Score: 5  Lactation Tools Discussed/Used Tools: Pump Breast pump type: Double-Electric Breast Pump Pump Review: Setup, frequency, and cleaning;Milk Storage Initiated by:: RN Date initiated:: 03/03/15   Consult Status Consult Status: Follow-up Date: 03/04/15 (in pm) Follow-up type: In-patient    Cullan Launer, Diamond NickelLAURA G 03/04/2015, 5:03 AM

## 2015-03-04 NOTE — Progress Notes (Signed)
Subjective: Postop Day 1: Cesarean Delivery No complaints.  Pain controlled.  Lochia normal.  Breast feeding yes.  Desires outpatient circumcision.  Objective: Temp:  [97.3 F (36.3 C)-98.7 F (37.1 C)] 97.8 F (36.6 C) (04/14 1005) Pulse Rate:  [64-82] 74 (04/14 1005) Resp:  [18-20] 20 (04/14 1005) BP: (120-134)/(68-90) 120/88 mmHg (04/14 1005) SpO2:  [98 %-100 %] 98 % (04/14 1005)  Physical Exam: Gen: NAD, baby latched well Lochia: Not visualized Uterine Fundus: firm, appropriately tender Incision:  Pressure dressing clean DVT Evaluation:  Edema present, no calf tenderness bilaterally    Recent Labs  03/03/15 0745 03/04/15 0540  HGB 11.7* 9.1*  HCT 35.4* 27.5*    Assessment/Plan: Status post C-section due to breech presentation, labor-doing well postoperatively. Routine post op care. Lactation support. Encouraged ambulation in halls TID. Outpatient circumcision. Dr. Charlotta Newtonzan will see pt tomorrow morning.    Deborah Juarez, Deborah Juarez 03/04/2015, 1:03 PM

## 2015-03-05 DIAGNOSIS — F419 Anxiety disorder, unspecified: Secondary | ICD-10-CM | POA: Diagnosis not present

## 2015-03-05 DIAGNOSIS — J45909 Unspecified asthma, uncomplicated: Secondary | ICD-10-CM | POA: Diagnosis not present

## 2015-03-05 NOTE — Discharge Summary (Signed)
Cesarean Section Delivery Discharge Summary  Deborah Juarez  DOB:    [birth year] MRN:    [id number] CSN:    [id number]  Date of admission:                  03/03/15  Date of discharge:                   03/06/15 t Procedures this admission:  Primary LTCS due to breech/transverse lie, latent labor  Date of Delivery: 03/03/15  Newborn Data:  Live born female  Birth Weight: 5 lb 10 oz (2551 g) APGAR: 3, 9  Home with mother.  Circumcision Plan: Outpatient  History of Present Illness:  Ms. Deborah Juarez is a 24 y.o. female, G1P0101, who presents at 6253w4d weeks gestation. The patient has been [facility] at [facility] by Dr. Charlotta Newtonzan.   Her pregnancy has been complicated by:  Patient Active Problem List   Diagnosis Date Noted  . Asthma 03/05/2015  . Anxiety 03/05/2015  . Status post primary low transverse cesarean section--malpresentation 03/03/2015     Hospital Course--Unscheduled Cesarean:  Admitted 03/03/15 in latent labor with fetus in transverse position.  She was consented for cesarean, with Dr. Charlotta Newtonzan performing a primary LTCS under spinal anesthesia, with delivery of a viable female, with weight and Apgars as listed below. Infant was in good condition and remained at the patient's bedside.  The patient was taken to recovery in good condition.  Patient planned to breast feed.  On post-op day 1, patient was doing well, tolerating a regular diet, with Hgb of 9.1, down from 11.7, with no evidence of syncope or dizziness.  Throughout her stay, her physical exam was WNL, her incision was CDI, and her vital signs remained stable.  By post-op day 3, she was up ad lib, tolerating a regular diet, with good pain control with po med.  She was deemed to have received the full benefit of her hospital stay, and was discharged home in stable condition.  Contraceptive choice--plans IUD.    Double layer closer noted on the uterus.  SW saw the patient for hx of anxiety/depression, with no issues  identified.  Feeding:  breast  Contraception:  IUD  Hemoglobin Results:  CBC Latest Ref Rng 03/04/2015 03/03/2015  WBC 4.0 - 10.5 K/uL 10.8(H) 8.7  Hemoglobin 12.0 - 15.0 g/dL 7.8(G9.1(L) 11.7(L)  Hematocrit 36.0 - 46.0 % 27.5(L) 35.4(L)  Platelets 150 - 400 K/uL 180 215     Discharge Physical Exam:   General: alert Lochia: appropriate Uterine Fundus: firm Abdomen:  + bowel sounds Incision: Honeycomb dressing CDI DVT Evaluation: No evidence of DVT seen on physical exam. Negative Homan's sign.  Intrapartum Procedures: cesarean: low cervical, transverse Postpartum Procedures: none Complications-Operative and Postpartum: none  Discharge Diagnoses: IUP at 36 5/7 weeks, latent labor, malpresentation, anemia without hemodynamic instability  Discharge Information:  Activity:           pelvic rest Diet:                routine Medications: Ibuprofen, Iron and Percocet Condition:      stable Instructions:  Routine pp  Discharge to: home  Follow-up Information    [facility]. Schedule an appointment as soon as possible for a visit in 2 weeks.   Specialty:  Obstetrics and Gynecology   Why:  Call for any questions or concerns.  Contact [facility] to schedule 2 week appt.   Contact information:  [address] [place] [address] [phone]        Nigel Bridgeman CNM 03/06/2015 8:55 AM

## 2015-03-05 NOTE — Progress Notes (Signed)
Subjective: Postop Day 2: Cesarean Delivery No complaints.  Pain controlled.  Lochia normal.  Breast feeding yes.  Desires outpatient circumcision.  Objective: Temp:  [97.8 F (36.6 C)-98.8 F (37.1 C)] 98.8 F (37.1 C) (04/15 0602) Pulse Rate:  [74-95] 76 (04/15 0602) Resp:  [20] 20 (04/15 0602) BP: (106-126)/(62-88) 106/62 mmHg (04/15 0602) SpO2:  [98 %] 98 % (04/14 1005)  Physical Exam: Gen: NAD, baby latched well CV: RRR Lungs: CTAB Abd: soft, non-tender, uterus firm, below umbilicus Incision:  Honeycomb in place, incision C/D/I DVT Evaluation:  Minimal 1+ non-pitting edema R>L, no calf tenderness bilaterally    Recent Labs  03/03/15 0745 03/04/15 0540  HGB 11.7* 9.1*  HCT 35.4* 27.5*    Assessment/Plan:  23yo G1P0101 s/p primary C-section due to breech presentation, labor-POD #2 Lactation support. Encouraged ambulation in halls TID. Outpatient circumcision. Continue with routine post op care Plan for discharge home tomorrow.  Myna HidalgoJennifer Alitza Cowman, DO [phone] (pager) [phone] (office)

## 2015-03-05 NOTE — Lactation Note (Signed)
This note was copied from the chart of Deborah Juarez. Lactation Consultation Note BF baby in football position. States baby is doing much better. Mom showed me her milk  Was coming in, noted transitional milk. Mom squeezed her breast and squirted milk. She was so excited! Baby had good latch and and was gulping at the breast. Encouraged mom that she didn't need to supplement w/formula now that she had all of that milk. Patient Name: Deborah [name] EAVWU'JToday's Date: 03/05/2015 Reason for consult: Follow-up assessment   Maternal Data    Feeding Feeding Type: Breast Fed Length of feed: 15 min (still BF)  LATCH Score/Interventions Latch: Grasps breast easily, tongue down, lips flanged, rhythmical sucking. Intervention(s): Skin to skin;Teach feeding cues;Waking techniques Intervention(s): Breast massage;Breast compression  Audible Swallowing: Spontaneous and intermittent Intervention(s): Hand expression;Alternate breast massage  Type of Nipple: Everted at rest and after stimulation  Comfort (Breast/Nipple): Soft / non-tender     Hold (Positioning): Assistance needed to correctly position infant at breast and maintain latch. Intervention(s): Breastfeeding basics reviewed;Support Pillows;Position options;Skin to skin  LATCH Score: 9  Lactation Tools Discussed/Used Breast pump type: Double-Electric Breast Pump   Consult Status Consult Status: Follow-up Date: 03/06/15 Follow-up type: In-patient    Charyl DancerCARVER, Ovie Cornelio G 03/05/2015, 2:37 PM

## 2015-03-06 MED ORDER — IBUPROFEN 600 MG PO TABS: 600.0000 mg | ORAL_TABLET | Freq: Four times a day (QID) | ORAL | Status: DC | PRN

## 2015-03-06 MED ORDER — FERROUS SULFATE 325 (65 FE) MG PO TABS: 325.0000 mg | ORAL_TABLET | Freq: Every day | ORAL | Status: DC

## 2015-03-06 MED ORDER — OXYCODONE-ACETAMINOPHEN 5-325 MG PO TABS: 1.0000 | ORAL_TABLET | ORAL | Status: DC | PRN

## 2015-03-06 NOTE — Lactation Note (Signed)
This note was copied from the chart of Deborah Ninfa Juarez. Lactation Consultation Note  Patient Name: Deborah Juarez KGMWN'UToday's Date: 03/06/2015 Reason for consult: Follow-up assessment    With this mom and now 37 week CGa baby, at 9% weight loss. Mom's milk is in, but she has not been supplementing the baby. I observed her pumping this morning, and decreased her to 24 flanges, for a better fit. I told mom to pump in standard setting now, and nhopefully with these changes she will express more milk. Mom does have a DEP at home. Mom was getting ready to eat breakfast, so I told her to call for me when ready for teaching, or I would check in on her later. I told her with the baby at 9% weight loss, he may not go home today. I also told her to supplement the baby with any EBM she expresses today.    Maternal Data    Feeding    LATCH Score/Interventions                      Lactation Tools Discussed/Used Tools: Flanges Flange Size: 24 (decreased mom to 24 flanges)   Consult Status Consult Status: Follow-up Date: 03/06/15 Follow-up type: In-patient    Deborah Juarez, Deborah Juarez Anne 03/06/2015, 8:49 AM

## 2015-03-06 NOTE — Discharge Instructions (Signed)

## 2015-03-06 NOTE — Lactation Note (Signed)
This note was copied from the chart of Deborah Juarez. Lactation Consultation Note  Patient Name: Deborah [name] ZOXWR'UToday's Date: 03/06/2015 Reason for consult: Follow-up assessment with this mom and baby. I observed mom latching baby - baby latched easily and deeply with strong suckles and visible swallows. Mom was able to pump 2-3 ml's of EBM, which she will bottle feed to the baby after breast feeding.    Maternal Data    Feeding Feeding Type: Breast Fed Length of feed: 25 min  LATCH Score/Interventions Latch: Grasps breast easily, tongue down, lips flanged, rhythmical sucking. Intervention(s): Skin to skin;Teach feeding cues;Waking techniques  Audible Swallowing: A few with stimulation  Type of Nipple: Everted at rest and after stimulation  Comfort (Breast/Nipple): Soft / non-tender     Hold (Positioning): No assistance needed to correctly position infant at breast. Intervention(s): Breastfeeding basics reviewed;Support Pillows;Position options;Skin to skin  LATCH Score: 9  Lactation Tools Discussed/Used Tools: Flanges Flange Size: 24 (decreased mom to 24 flanges)   Consult Status Consult Status: Complete Date: 03/06/15 Follow-up type: Call as needed    Alfred LevinsLee, Marionna Gonia Anne 03/06/2015, 9:25 AM

## 2015-03-07 ENCOUNTER — Ambulatory Visit: Payer: Self-pay

## 2015-03-07 NOTE — Lactation Note (Signed)
This note was copied from the chart of Deborah Secret Juarez. Lactation Consultation Note   Follow up[ consult wt, baby transferred 15 mls at breast, and fed 10 mls by bottle after breast feeding. Mom and baby being discharged to home. Baby has gained another ounce since last evening weight. Mom knows to cal lactation for questions/concerns /o/p appointment, and was encouraged to go to the Lactation support group  Patient Name: Deborah Juarez NWGNF'AToday's Date: 03/07/2015     Maternal Data    Feeding    LATCH Score/Interventions                      Lactation Tools Discussed/Used     Consult Status      Deborah Juarez, Deborah Juarez 03/07/2015, 4:54 PM

## 2015-03-07 NOTE — Lactation Note (Signed)
This note was copied from the chart of Deborah Juarez. Lactation Consultation Note  Patient Name: Deborah [name] XBMWU'XToday's Date: 03/07/2015 Reason for consult: Follow-up assessment   With this mom of a LPI now 214 days old, and weight 5 lbs 2.2 oz, at 8.7% weight loss, but gained 10 grams from yesterday. Mom has not been pumping with every feeding. i explained to mom that due to his gestation and size, he alone could not support her milk supply, so pumping every 3 hours until she stops dripping will increase her supply and provide more EBm for the baby.  The plan is to reweigh the baby before the next feeding, and to do a pre and post weight, to see how much he can transfer at the breast. Mom will call before the next feeding.   Maternal Data    Feeding Feeding Type: Breast Fed  LATCH Score/Interventions Latch: Grasps breast easily, tongue down, lips flanged, rhythmical sucking. Intervention(s): Skin to skin;Teach feeding cues;Waking techniques Intervention(s): Assist with latch  Audible Swallowing: A few with stimulation Intervention(s): Hand expression  Type of Nipple: Everted at rest and after stimulation  Comfort (Breast/Nipple): Soft / non-tender     Hold (Positioning): Assistance needed to correctly position infant at breast and maintain latch. Intervention(s): Breastfeeding basics reviewed;Support Pillows;Position options;Skin to skin  LATCH Score: 8  Lactation Tools Discussed/Used Pump Review: Setup, frequency, and cleaning   Consult Status Consult Status: Follow-up Date: 03/07/15 Follow-up type: In-patient    Alfred LevinsLee, Shameca Landen Anne 03/07/2015, 9:24 AM

## 2015-09-08 ENCOUNTER — Other Ambulatory Visit: Payer: Self-pay | Admitting: Obstetrics and Gynecology

## 2015-09-08 ENCOUNTER — Other Ambulatory Visit (HOSPITAL_COMMUNITY)
Admission: RE | Admit: 2015-09-08 | Discharge: 2015-09-08 | Disposition: A | Discharge status: Home / Self Care | Payer: Medicaid Other | Source: Ambulatory Visit | Attending: Obstetrics and Gynecology | Admitting: Obstetrics and Gynecology

## 2015-09-08 DIAGNOSIS — Z01419 Encounter for gynecological examination (general) (routine) without abnormal findings: Secondary | ICD-10-CM | POA: Insufficient documentation

## 2015-09-09 LAB — CYTOLOGY - PAP

## 2016-02-11 ENCOUNTER — Encounter (HOSPITAL_COMMUNITY): Payer: Self-pay | Admitting: Emergency Medicine

## 2016-02-11 ENCOUNTER — Emergency Department (INDEPENDENT_AMBULATORY_CARE_PROVIDER_SITE_OTHER)
Admission: EM | Admit: 2016-02-11 | Discharge: 2016-02-11 | Disposition: A | Discharge status: Home / Self Care | Payer: Self-pay | Source: Home / Self Care | Attending: Family Medicine | Admitting: Family Medicine

## 2016-02-11 DIAGNOSIS — J069 Acute upper respiratory infection, unspecified: Secondary | ICD-10-CM

## 2016-02-11 DIAGNOSIS — J301 Allergic rhinitis due to pollen: Secondary | ICD-10-CM

## 2016-02-11 MED ORDER — CETIRIZINE HCL 10 MG PO TABS: 10.0000 mg | ORAL_TABLET | Freq: Every day | ORAL | Status: DC

## 2016-02-11 NOTE — ED Provider Notes (Signed)
CSN: [id number]     Arrival date & time 02/11/16  1436 History   First MD Initiated Contact with Patient 02/11/16 1651     Chief Complaint  Patient presents with  . Influenza   (Consider location/radiation/quality/duration/timing/severity/associated sxs/prior Treatment) Patient is a 25 y.o. female presenting with flu symptoms. The history is provided by the patient. No language interpreter was used.  Influenza Presenting symptoms: cough, rhinorrhea and sore throat   Presenting symptoms: no diarrhea, no fatigue, no fever, no nausea and no vomiting   Associated symptoms: no chills    Patient presents with complaint of cough since 03/21, recently started with watery rhinorrhea and sore throat. Husband ill with similar sxs, nephew with recent flu diagnosis.  She had Tmax at home 50F. Cough with light yellow sputum, watery rhinorrhea.  No nausea/vomiting, no diarrhea.  LMP started 02/04/16, not lactating.  Past Medical History  Diagnosis Date  . Asthma   . Anxiety   . GERD (gastroesophageal reflux disease)    Past Surgical History  Procedure Laterality Date  . Breast surgery    . Cesarean section N/A 03/03/2015    Procedure: CESAREAN SECTION;  Surgeon: Myna Hidalgo, DO;  Location: [facility];  Service: Obstetrics;  Laterality: N/A;   Family History  Problem Relation Age of Onset  . Heart disease Father   . Heart disease Maternal Grandfather   . Heart disease Paternal Grandfather   . Stroke Paternal Grandfather    Social History  Substance Use Topics  . Smoking status: Never Smoker   . Smokeless tobacco: Never Used  . Alcohol Use: No   OB History    Gravida Para Term Preterm AB TAB SAB Ectopic Multiple Living   0 1     Review of Systems  Constitutional: Negative for fever, chills, diaphoresis and fatigue.  HENT: Positive for rhinorrhea and sore throat.   Respiratory: Positive for cough.   Gastrointestinal: Negative for nausea, vomiting, abdominal pain, diarrhea and  constipation.  Genitourinary: Negative for dysuria.    Allergies  Review of patient's allergies indicates no known allergies.  Home Medications   Prior to Admission medications   Medication Sig Start Date End Date Taking? Authorizing Provider  ALPRAZolam Prudy Feeler) 0.5 MG tablet Take 0.5 mg by mouth at bedtime as needed for anxiety.   Yes Historical Provider, MD  acetaminophen (TYLENOL) 500 MG tablet Take 500 mg by mouth every 6 (six) hours as needed for headache.    Historical Provider, MD  calcium carbonate (TUMS - DOSED IN MG ELEMENTAL CALCIUM) 500 MG chewable tablet Chew 2 tablets by mouth daily as needed for indigestion or heartburn.     Historical Provider, MD  cetirizine (ZYRTEC) 10 MG tablet Take 1 tablet (10 mg total) by mouth daily. 02/11/16   Barbaraann Barthel, MD  Doxylamine-Pyridoxine 10-10 MG TBEC Take 1-2 tablets by mouth 3 (three) times daily as needed (nausea).    Historical Provider, MD  ferrous sulfate 325 (65 FE) MG tablet Take 1 tablet (325 mg total) by mouth daily with breakfast. 03/06/15   Nigel Bridgeman, CNM  ibuprofen (ADVIL,MOTRIN) 600 MG tablet Take 1 tablet (600 mg total) by mouth every 6 (six) hours as needed. 03/06/15   Nigel Bridgeman, CNM  oxyCODONE-acetaminophen (PERCOCET/ROXICET) 5-325 MG per tablet Take 1 tablet by mouth every 4 (four) hours as needed (for pain scale 4-7). 03/06/15   Nigel Bridgeman, CNM  Prenatal Vit-Fe Fumarate-FA (PRENATAL MULTIVITAMIN) TABS tablet Take 1  tablet by mouth daily at 12 noon.    Historical Provider, MD   Meds Ordered and Administered this Visit  Medications - No data to display  BP 113/82 mmHg  Pulse 89  Temp(Src) 99.1 F (37.3 C) (Oral)  Resp 14  SpO2 100%  LMP 02/10/2016 No data found.   Physical Exam  Constitutional: She appears well-developed and well-nourished. No distress.  HENT:  Head: Normocephalic and atraumatic.  Left Ear: External ear normal.  Mouth/Throat: Oropharynx is clear and moist. No oropharyngeal exudate.   Boggy nasal mucosa, with thin watery secretion.  No frontal or maxillary sinus tenderness.   Eyes: Conjunctivae are normal. Right eye exhibits no discharge. Left eye exhibits no discharge.  Neck: Normal range of motion. Neck supple. No thyromegaly present.  Neck supple. Cobblestoning oropharynx. Watery nasal discharge.   Cardiovascular: Normal rate, regular rhythm and normal heart sounds.   No murmur heard. Pulmonary/Chest: Effort normal and breath sounds normal. No respiratory distress. She has no wheezes. She has no rales. She exhibits no tenderness.  Abdominal: Soft. She exhibits no distension. There is no tenderness.  Lymphadenopathy:    She has no cervical adenopathy.  Skin: She is not diaphoretic.    ED Course  Procedures (including critical care time)  Labs Review Labs Reviewed - No data to display  Imaging Review No results found.   Visual Acuity Review  Right Eye Distance:   Left Eye Distance:   Bilateral Distance:    Right Eye Near:   Left Eye Near:    Bilateral Near:         MDM   1. Upper respiratory infection   2. Allergic rhinitis due to pollen    Patient with sxs most likely attributable to minor URI, versus onset of seasonal allergies which she has had in the past. She has [facility] benefit [facility] and still has at home.  Is not breastfeeding nor pregnant.  She has primary doctor at [facility], for follow up if not improving.   Paula ComptonJames Ikechukwu Cerny, MD    Barbaraann BarthelJames O Marlowe Cinquemani, MD 02/11/16 [phone]

## 2016-02-11 NOTE — ED Notes (Signed)
Patient c/o flu like symptoms including cough, congestion, sob, chills, and fatigue onset Tuesday. Patient reports a family member has recently been sick also. Patient is in NAD. Denies fever.

## 2016-02-11 NOTE — Discharge Instructions (Signed)
It is a pleasure to see you today.  I believe your symptoms may be due to a mild upper respiratory illness and/or seasonal allergies.   I recommend returning to use of FLONASE nasal spray, 2 sprays/nostril once daily in the morning> Gargle with water after using.   You may benefit from use of CETIRIZINE 10mg  tablets, 1 tablet by mouth daily, for allergic symptoms.   [facility] if worsening.

## 2018-06-26 ENCOUNTER — Other Ambulatory Visit: Payer: Self-pay | Admitting: Family Medicine

## 2018-06-26 DIAGNOSIS — M5410 Radiculopathy, site unspecified: Secondary | ICD-10-CM

## 2018-07-02 ENCOUNTER — Ambulatory Visit
Admission: RE | Admit: 2018-07-02 | Discharge: 2018-07-02 | Disposition: A | Discharge status: Home / Self Care | Payer: BLUE CROSS/BLUE SHIELD | Source: Ambulatory Visit | Attending: Family Medicine | Admitting: Family Medicine

## 2018-07-02 DIAGNOSIS — M5410 Radiculopathy, site unspecified: Secondary | ICD-10-CM

## 2019-04-21 ENCOUNTER — Inpatient Hospital Stay (HOSPITAL_COMMUNITY)
Admission: AD | Admit: 2019-04-21 | Discharge: 2019-04-21 | Disposition: A | Discharge status: Home / Self Care | Payer: BLUE CROSS/BLUE SHIELD | Attending: Obstetrics and Gynecology | Admitting: Obstetrics and Gynecology

## 2019-04-21 ENCOUNTER — Other Ambulatory Visit: Payer: Self-pay

## 2019-04-21 ENCOUNTER — Encounter (HOSPITAL_COMMUNITY): Payer: Self-pay | Admitting: *Deleted

## 2019-04-21 DIAGNOSIS — O26899 Other specified pregnancy related conditions, unspecified trimester: Secondary | ICD-10-CM

## 2019-04-21 DIAGNOSIS — O9989 Other specified diseases and conditions complicating pregnancy, childbirth and the puerperium: Secondary | ICD-10-CM | POA: Insufficient documentation

## 2019-04-21 DIAGNOSIS — Z3A01 Less than 8 weeks gestation of pregnancy: Secondary | ICD-10-CM | POA: Insufficient documentation

## 2019-04-21 DIAGNOSIS — R102 Pelvic and perineal pain: Secondary | ICD-10-CM | POA: Insufficient documentation

## 2019-04-21 DIAGNOSIS — Z803 Family history of malignant neoplasm of breast: Secondary | ICD-10-CM | POA: Insufficient documentation

## 2019-04-21 DIAGNOSIS — O009 Unspecified ectopic pregnancy without intrauterine pregnancy: Secondary | ICD-10-CM | POA: Insufficient documentation

## 2019-04-21 DIAGNOSIS — O26851 Spotting complicating pregnancy, first trimester: Secondary | ICD-10-CM | POA: Insufficient documentation

## 2019-04-21 DIAGNOSIS — Z833 Family history of diabetes mellitus: Secondary | ICD-10-CM | POA: Insufficient documentation

## 2019-04-21 LAB — CBC
HCT: 37.7 % (ref 36.0–46.0)
Hemoglobin: 12.1 g/dL (ref 12.0–15.0)
MCH: 29 pg (ref 26.0–34.0)
MCHC: 32.1 g/dL (ref 30.0–36.0)
MCV: 90.4 fL (ref 80.0–100.0)
Platelets: 227 10*3/uL (ref 150–400)
RBC: 4.17 MIL/uL (ref 3.87–5.11)
RDW: 12.4 % (ref 11.5–15.5)
WBC: 5.7 10*3/uL (ref 4.0–10.5)
nRBC: 0 % (ref 0.0–0.2)

## 2019-04-21 LAB — HCG, QUANTITATIVE, PREGNANCY: hCG, Beta Chain, Quant, S: 4624 m[IU]/mL — ABNORMAL HIGH (ref <5)

## 2019-04-21 LAB — URINALYSIS, ROUTINE W REFLEX MICROSCOPIC
Bilirubin Urine: NEGATIVE
Glucose, UA: NEGATIVE mg/dL
Ketones, ur: NEGATIVE mg/dL
Leukocytes,Ua: NEGATIVE
Nitrite: NEGATIVE
Protein, ur: 30 mg/dL — AB
RBC / HPF: >50 RBC/hpf — ABNORMAL HIGH (ref 0–5)
Specific Gravity, Urine: 1.024 (ref 1.005–1.030)
pH: 5 (ref 5.0–8.0)

## 2019-04-21 LAB — COMPREHENSIVE METABOLIC PANEL
ALT: 12 U/L (ref 0–44)
AST: 20 U/L (ref 15–41)
Albumin: 4.6 g/dL (ref 3.5–5.0)
Alkaline Phosphatase: 43 U/L (ref 38–126)
Anion gap: 10 (ref 5–15)
BUN: 6 mg/dL (ref 6–20)
CO2: 25 mmol/L (ref 22–32)
Calcium: 9.5 mg/dL (ref 8.9–10.3)
Chloride: 103 mmol/L (ref 98–111)
Creatinine, Ser: 0.85 mg/dL (ref 0.44–1.00)
GFR calc Af Amer: >60 mL/min (ref >60)
GFR calc non Af Amer: >60 mL/min (ref >60)
Glucose, Bld: 100 mg/dL — ABNORMAL HIGH (ref 70–99)
Potassium: 3.6 mmol/L (ref 3.5–5.1)
Sodium: 138 mmol/L (ref 135–145)
Total Bilirubin: 0.6 mg/dL (ref 0.3–1.2)
Total Protein: 7.3 g/dL (ref 6.5–8.1)

## 2019-04-21 MED ORDER — METHOTREXATE FOR ECTOPIC PREGNANCY
50.0000 mg/m2 | Freq: Once | INTRAMUSCULAR | Status: AC
  Administered 2019-04-21: 85 mg via INTRAMUSCULAR
  Filled 2019-04-21: qty 1

## 2019-04-21 NOTE — H&P (Signed)
Chief Complaint(s):   spotting and cramping   HPI:  General 28 y/o G2 p1,0,0,1 presents complaining of spotting and pelvic pain Estimated gestation 24wks 3d with an EDD is 12/11/2019. u/s today showed no endometrial mass The endometrium measured 2.78mm, no blood flow noted. Right ovary appeared normal. Left ovary appeared normal. In the left adnexal there is hypoechoic mass measuring 2.7 x 2.1 x 1.6cm. it is avascular. Small amount of fluid seen in the culdesac. Reported by the ultrasonagpher there is a small amount of pain when palpated upon u/s. and abdominal cramping.  Pt. states mild cramping started 03/31/2019 and she was experiencing spotting around that time. She states that this stopped. The spotting started again on 04/14/2019 with mild cramping. She states last night the pain was severe, which she rates as a 7/10. She describes that the pain comes in waves.  Pt. describes the spotting as a light menses. Discussed with pt. that this is most likely an ectopic pregnancy. Upon pelvic examination tenderness was noted. Current Medication: Taking Prenatal 1 Plus 1. Not-Taking Skyla IUD 13.5 MG IUD Insert within 7 days of menses onset Intrauterine replace every 3 years. Methocarbamol 500 MG Tablet 1-2 tablets as needed for muscle pain Orally at bedtime. Meloxicam 15 MG Tablet 1/2 to 1 tablet Orally Once a day. Medication List reviewed and reconciled with the patient. Medical History:  migraine headaches - not currently     ADD     anxiety/panic attacks, history of counseling in College     buldging disc     Pregnancy: yes      Allergies/Intolerance:  Vyvanse: Side Effects - "too strong"     Strattera: Side Effects - sleepy, nausea   Gyn History:  Sexual activity yes- same partner. LMP 03/07/2019 . Last pap smear date 09/08/15-neg. Last mammogram date N/A. Denies Abnormal pap smear no. Denies STD none. Menarche age 9.   OB History:  Number of pregnancies 1. Pregnancy # 1 live  birth, C-section delivery, boy, "Marley".   Surgical History:  R breast mass removed - benign 04/2010     c section 02/2015   Hospitalization:  childbirth 02/2015   Family History:  Father: alive 39 yrs, heart attack, diagnosed with Coronary artery disease, Lung Cancer    Mother: alive 83 yrs, anxiety    Maternal Grand Father: diabetes, Diabetes    Maternal Grand Mother: diabetes, hypertension    Paternal aunt: Breast cancer    2 brother(s) , 1 sister(s) . 1 son(s) .    No family history of colon, ovarian or cancer.  Social History: General no EXPOSURE TO PASSIVE SMOKE.  Alcohol: no.  Children: 1, son (s) Alric Quan 06/01/2015 10:56:51 AM >4/1 01/2015.  Caffeine: yes, coffee, occasionally, green tea, soda, occasionally.  EDUCATION: yes, [employer] grad.  Tobacco use cigarettes: Never smoked, Tobacco history last updated 04/21/2019.  Marital Status: married.  no Recreational drug use.  OCCUPATION: employed, Government social research officer at [employer].  Engaged to be married 5/15.  ROS: CONSTITUTIONAL No" options="no,yes" propid="91" itemid="193425" categoryid="10464" encounterid="[id number]"Chills No. No" options="no,yes" propid="91" itemid="172899" categoryid="10464" encounterid="[id number]"Fatigue No. No" options="no,yes" propid="91" itemid="10467" categoryid="10464" encounterid="[id number]"Fever No. No" options="no,yes" propid="91" itemid="193426" categoryid="10464" encounterid="[id number]"Night sweats No. No" options="no,yes" propid="91" itemid="444261" categoryid="10464" encounterid="[id number]"Recent travel outside [place] No. No" options="no,yes" propid="91" itemid="193427" categoryid="10464" encounterid="[id number]"Sweats No. No" options="no,yes" propid="91" itemid="194825" categoryid="10464" encounterid="[id number]"Weight change No.  OPHTHALMOLOGY no" options="no,yes" propid="91" itemid="12520" categoryid="12516" encounterid="[id number]"Blurring of vision no. no" options="no,yes" propid="91"  itemid="193469" categoryid="12516" encounterid="[id number]"Change in vision no. no" options="no,yes" propid="91" itemid="194379" categoryid="12516" encounterid="[id number]"Double vision no.  ENT  no" options="no,yes" propid="91" itemid="193612" categoryid="10481" encounterid="[id number]"Dizziness no. Nose bleeds no. Sore throat no. Teeth pain no.  ALLERGY no" options="no,yes" propid="91" itemid="202589" categoryid="138152" encounterid="[id number]"Hives no.  CARDIOLOGY no" options="no,yes" propid="91" itemid="193603" categoryid="10488" encounterid="[id number]"Chest pain no. no" options="no,yes" propid="91" itemid="199089" categoryid="10488" encounterid="[id number]"High blood pressure no. no" options="no,yes" propid="91" itemid="202598" categoryid="10488" encounterid="[id number]"Irregular heart beat no. no" options="no,yes" propid="91" itemid="10491" categoryid="10488" encounterid="[id number]"Leg edema no. no" options="no,yes" propid="91" itemid="10490" categoryid="10488" encounterid="[id number]"Palpitations no.  RESPIRATORY no" options="no" propid="91" itemid="270013" categoryid="138132" encounterid="[id number]"Shortness of breath no. no" options="no,yes" propid="91" itemid="172745" categoryid="138132" encounterid="[id number]"Cough no. no" options="no,yes" propid="91" itemid="193621" categoryid="138132" encounterid="[id number]"Wheezing no.  UROLOGY no" options="no,yes" propid="91" itemid="194377" categoryid="138166" encounterid="[id number]"Pain with urination no. no" options="no,yes" propid="91" itemid="193493" categoryid="138166" encounterid="[id number]"Urinary urgency no. no" options="no,yes" propid="91" itemid="193492" categoryid="138166" encounterid="[id number]"Urinary frequency no. no" options="no,yes" propid="91" itemid="138171" categoryid="138166" encounterid="[id number]"Urinary incontinence no. No" options="no,yes" propid="91" itemid="138167" categoryid="138166" encounterid="[id number]"Difficulty urinating No. No" options="no,yes"  propid="91" itemid="138168" categoryid="138166" encounterid="[id number]"Blood in urine No.  GASTROENTEROLOGY no" options="no,yes" propid="91" itemid="10496" categoryid="10494" encounterid="[id number]"Abdominal pain no. no" options="no,yes" propid="91" itemid="193447" categoryid="10494" encounterid="[id number]"Appetite change no. no" options="no,yes" propid="91" itemid="193448" categoryid="10494" encounterid="[id number]"Bloating/belching no. no" options="no,yes" propid="91" itemid="10503" categoryid="10494" encounterid="[id number]"Blood in stool or on toilet paper no. no" options="no,yes" propid="91" itemid="199106" categoryid="10494" encounterid="[id number]"Change in bowel movements no. no" options="no,yes" propid="91" itemid="10501" categoryid="10494" encounterid="[id number]"Constipation no. no" options="no,yes" propid="91" itemid="10502" categoryid="10494" encounterid="[id number]"Diarrhea no. no" options="no,yes" propid="91" itemid="199104" categoryid="10494" encounterid="[id number]"Difficulty swallowing no. no" options="no,yes" propid="91" itemid="10499" categoryid="10494" encounterid="[id number]"Nausea no.  FEMALE REPRODUCTIVE no" options="no,yes" propid="91" itemid="453725" categoryid="10525" encounterid="[id number]"Vulvar pain no. no" options="no,yes" propid="91" itemid="453726" categoryid="10525" encounterid="[id number]"Vulvar rash no. yes" options="no, yes" propid="91" itemid="444315" categoryid="10525" encounterid="[id number]"Abnormal vaginal bleeding yes. no" options="no,yes" propid="91" itemid="186083" categoryid="10525" encounterid="[id number]"Breast pain no. no" options="no,yes" propid="91" itemid="186084" categoryid="10525" encounterid="[id number]"Nipple discharge no. no" options="no,yes" propid="91" itemid="275823" categoryid="10525" encounterid="[id number]"Pain with intercourse no. yes, left side" options="no,yes" propid="91" itemid="186082" categoryid="10525" encounterid="[id number]"Pelvic pain yes, left side. no"  options="no,yes" propid="91" itemid="278230" categoryid="10525" encounterid="[id number]"Unusual vaginal discharge no. no" options="no,yes" propid="91" itemid="278942" categoryid="10525" encounterid="[id number]"Vaginal itching no.  MUSCULOSKELETAL no" options="no,yes" propid="91" itemid="193461" categoryid="10514" encounterid="[id number]"Muscle aches no.  NEUROLOGY no" options="no,yes" propid="91" itemid="12513" categoryid="12512" encounterid="[id number]"Headache no. no" options="no,yes" propid="91" itemid="12514" categoryid="12512" encounterid="[id number]"Tingling/numbness no. no" options="no,yes" propid="91" itemid="193468" categoryid="12512" encounterid="[id number]"Weakness no.  PSYCHOLOGY no" options="" propid="91" itemid="275919" categoryid="10520" encounterid="[id number]"Depression no. no" options="no,yes" propid="91" itemid="172748" categoryid="10520" encounterid="[id number]"Anxiety no. no" options="no,yes" propid="91" itemid="199158" categoryid="10520" encounterid="[id number]"Nervousness no. no" options="no,yes" propid="91" itemid="12502" categoryid="10520" encounterid="[id number]"Sleep disturbances no. no " options="no,yes" propid="91" itemid="72718" categoryid="10520" encounterid="[id number]"Suicidal ideation no .  ENDOCRINOLOGY no" options="no,yes" propid="91" itemid="194628" categoryid="12508" encounterid="[id number]"Excessive thirst no. no" options="no,yes" propid="91" itemid="196285" categoryid="12508" encounterid="[id number]"Excessive urination no. no" options="no, yes" propid="91" itemid="444314" categoryid="12508" encounterid="[id number]"Hair loss no. no" options="" propid="91" itemid="447284" categoryid="12508" encounterid="[id number]"Heat or cold intolerance no.  HEMATOLOGY/LYMPH no" options="no,yes" propid="91" itemid="199152" categoryid="138157" encounterid="[id number]"Abnormal bleeding no. no" options="no,yes" propid="91" itemid="170653" categoryid="138157" encounterid="[id number]"Easy bruising no. no" options="no,yes"  propid="91" itemid="138158" categoryid="138157" encounterid="[id number]"Swollen glands no.  DERMATOLOGY no" options="no,yes" propid="91" itemid="199126" categoryid="12503" encounterid="[id number]"New/changing skin lesion no. no" options="no,yes" propid="91" itemid="12504" categoryid="12503" encounterid="[id number]"Rash no. no" options="" propid="91" itemid="444313" categoryid="12503" encounterid="[id number]"Sores no.   Negative except as stated in HPI.  Objective: Vitals: Wt 140, Temp 96.8  Past Results: Examination:  General Examination alert, oriented, NAD " categoryPropId="10089" examid="193638"CONSTITUTIONAL: alert, oriented, NAD .  moist, warm" categoryPropId="10109" examid="193638"SKIN: moist, warm.  Conjunctiva clear" categoryPropId="21468" examid="193638"EYES: Conjunctiva clear.  good I:E efffort noted" categoryPropId="87" examid="193638"LUNGS: good I:E efffort noted.  soft, non-tender/non-distended, bowel sounds present " categoryPropId="88" examid="193638"ABDOMEN: soft, non-tender/non-distended, bowel sounds present .  normal external genitalia,labia - unremarkable,vagina - pink moist mucosa, no lesions or abnormal discharge.. small amount of blood in the vaginal vault,cervix - no discharge or lesions or CMT,adnexa - left adnexal tendernss and fullness,uterus - nontender and normal size on palpation " categoryPropId="13414" examid="193638"FEMALE GENITOURINARY: normal external genitalia,labia - unremarkable,vagina - pink moist mucosa, no lesions  or abnormal discharge.. small amount of blood in the vaginal vault,cervix - no discharge or lesions or CMT,adnexa - left adnexal tendernss and fullness,uterus - nontender and normal size on palpation .  affect normal, good eye contact" categoryPropId="16316" examid="193638"PSYCH: affect normal, good eye contact.  Physical Examination:    Assessment: Assessment:  Spotting in first trimester - O26.851 (Primary)     Abdominal cramping affecting  pregnancy, antepartum - O26.899     Pelvic pain - R10.2     Ectopic pregnancy - O00.90     Plan: Treatment: Spotting in first trimester Imaging:US PREG UTERUS TRANSVAG Pelvic pain Notes: This is most likely caused by an ectopic pregnany. Pt. [facility] for further evaluation and treatment. Ectopic pregnancy Notes: Pt. advised [facility]. She is advised that quantitative hcg and complete metabolic panel labs will be drawn upon admission. Discussed treatment being either Methotrexate or laparoscopic surgery to remove mass. Plan to wait for lab results to determine futher management. Pt. advised no eating or drinking until plan of care is determined. [facility] her quantitative hcg levels were 4,624. Discussed options availble being methotrexate shot or laparoscopic surgery for removal of mass with possible left salpingectomy Pt. has opted for treatment with the Methotrexate shot. She was given instructions to return on day 4 for quantitative HCG labs and again on day 7 for quantitative HCG labs. She has been given precautions if she is experiencing severe pelvic pain to return to the ER immediately. She has B postivie blood type and wont need rhogam shot. Her starting hemoglobin levels are 12.0.

## 2019-04-21 NOTE — Progress Notes (Signed)
Orders entered per Dr. Dawayne Patricia request. RN to call Dr. Richardson Dopp with results.

## 2019-04-21 NOTE — MAU Note (Addendum)
Was having pain on left side (stabbing pressure, hurts when she sits down), went in for [place], told she has an ectopic. Started bleeding on the 25th.  Pressure and pain when she urinates, kind of like a UTI

## 2019-04-21 NOTE — Discharge Instructions (Signed)
Methotrexate injection What is this medicine? METHOTREXATE (METH oh TREX ate) is a chemotherapy drug used to treat cancer including breast cancer, leukemia, and lymphoma. This medicine can also be used to treat psoriasis and certain kinds of arthritis. This medicine may be used for other purposes; ask your health care provider or pharmacist if you have questions. What should I tell my health care provider before I take this medicine? They need to know if you have any of these conditions: -fluid in the stomach area or lungs -if you often drink alcohol -infection or immune system problems -kidney disease -liver disease -low blood counts, like low white cell, platelet, or red cell counts -lung disease -radiation therapy -stomach ulcers -ulcerative colitis -an unusual or allergic reaction to methotrexate, other medicines, foods, dyes, or preservatives -pregnant or trying to get pregnant -breast-feeding How should I use this medicine? This medicine is for infusion into a vein or for injection into muscle or into the spinal fluid (whichever applies). It is usually given by a health care professional in a hospital or clinic setting. In rare cases, you might get this medicine at home. You will be taught how to give this medicine. Use exactly as directed. Take your medicine at regular intervals. Do not take your medicine more often than directed. If this medicine is used for arthritis or psoriasis, it should be taken weekly, NOT daily. It is important that you put your used needles and syringes in a special sharps container. Do not put them in a trash can. If you do not have a sharps container, call your pharmacist or healthcare provider to get one. Talk to your pediatrician regarding the use of this medicine in children. While this drug may be prescribed for children as young as 2 years for selected conditions, precautions do apply. Overdosage: If you think you have taken too much of this medicine  contact a poison control center or emergency room at once. NOTE: This medicine is only for you. Do not share this medicine with others. What if I miss a dose? It is important not to miss your dose. Call your doctor or health care professional if you are unable to keep an appointment. If you give yourself the medicine and you miss a dose, talk with your doctor or health care professional. Do not take double or extra doses. What may interact with this medicine? This medicine may interact with the following medications: -acitretin -aspirin or aspirin-like medicines including salicylates -azathioprine -certain antibiotics like chloramphenicol, penicillin, tetracycline -certain medicines for stomach problems like esomeprazole, omeprazole, pantoprazole -cyclosporine -gold -hydroxychloroquine -live virus vaccines -mercaptopurine -NSAIDs, medicines for pain and inflammation, like ibuprofen or naproxen -other cytotoxic agents -penicillamine -phenylbutazone -phenytoin -probenacid -retinoids such as isotretinoin and tretinoin -steroid medicines like prednisone or cortisone -sulfonamides like sulfasalazine and trimethoprim/sulfamethoxazole -theophylline This list may not describe all possible interactions. Give your health care provider a list of all the medicines, herbs, non-prescription drugs, or dietary supplements you use. Also tell them if you smoke, drink alcohol, or use illegal drugs. Some items may interact with your medicine. What should I watch for while using this medicine? Avoid alcoholic drinks. In some cases, you may be given additional medicines to help with side effects. Follow all directions for their use. This medicine can make you more sensitive to the sun. Keep out of the sun. If you cannot avoid being in the sun, wear protective clothing and use sunscreen. Do not use sun lamps or tanning beds/booths. You may get drowsy  or dizzy. Do not drive, use machinery, or do anything that  needs mental alertness until you know how this medicine affects you. Do not stand or sit up quickly, especially if you are an older patient. This reduces the risk of dizzy or fainting spells. You may need blood work done while you are taking this medicine. Call your doctor or health care professional for advice if you get a fever, chills or sore throat, or other symptoms of a cold or flu. Do not treat yourself. This drug decreases your body's ability to fight infections. Try to avoid being around people who are sick. This medicine may increase your risk to bruise or bleed. Call your doctor or health care professional if you notice any unusual bleeding. Check with your doctor or health care professional if you get an attack of severe diarrhea, nausea and vomiting, or if you sweat a lot. The loss of too much body fluid can make it dangerous for you to take this medicine. Talk to your doctor about your risk of cancer. You may be more at risk for certain types of cancers if you take this medicine. Both men and women must use effective birth control with this medicine. Do not become pregnant while taking this medicine or until at least 1 normal menstrual cycle has occurred after stopping it. Women should inform their doctor if they wish to become pregnant or think they might be pregnant. Men should not father a child while taking this medicine and for 3 months after stopping it. There is a potential for serious side effects to an unborn child. Talk to your health care professional or pharmacist for more information. Do not breast-feed an infant while taking this medicine. What side effects may I notice from receiving this medicine? Side effects that you should report to your doctor or health care professional as soon as possible: -allergic reactions like skin rash, itching or hives, swelling of the face, lips, or tongue -back pain -breathing problems or shortness of breath -confusion -diarrhea -dry,  nonproductive cough -low blood counts - this medicine may decrease the number of white blood cells, red blood cells and platelets. You may be at increased risk of infections and bleeding -mouth sores -redness, blistering, peeling or loosening of the skin, including inside the mouth -seizures -severe headaches -signs of infection - fever or chills, cough, sore throat, pain or difficulty passing urine -signs and symptoms of bleeding such as bloody or black, tarry stools; red or dark-brown urine; spitting up blood or brown material that looks like coffee grounds; red spots on the skin; unusual bruising or bleeding from the eye, gums, or nose -signs and symptoms of kidney injury like trouble passing urine or change in the amount of urine -signs and symptoms of liver injury like dark yellow or brown urine; general ill feeling or flu-like symptoms; light-colored stools; loss of appetite; nausea; right upper belly pain; unusually weak or tired; yellowing of the eyes or skin -stiff neck -vomiting Side effects that usually do not require medical attention (report to your doctor or health care professional if they continue or are bothersome): -dizziness -hair loss -headache -stomach pain -upset stomach This list may not describe all possible side effects. Call your doctor for medical advice about side effects. You may report side effects to FDA at 1-800-FDA-1088. Where should I keep my medicine? If you are using this medicine at home, you will be instructed on how to store this medicine. Throw away any unused medicine after  the expiration date on the label. NOTE: This sheet is a summary. It may not cover all possible information. If you have questions about this medicine, talk to your doctor, pharmacist, or health care provider.  2019 Elsevier/Gold Standard (2017-06-28 13:31:42) Ectopic Pregnancy  An ectopic pregnancy happens when a fertilized egg grows outside the womb (uterus). The fertilized egg  cannot stay alive outside of the womb. This problem often happens in a fallopian tube. It is often caused by damage to the tube. If this problem is found early, you may be treated with medicine that stops the egg from growing. If your tube tears or bursts open (ruptures), you will bleed inside. Often, there is very bad pain in the lower belly. This is an emergency. You will need surgery. Get help right away. Follow these instructions at home: After being treated with medicine or surgery:  Rest and limit your activity for as long as told by your doctor.  Until your doctor says that it is safe: ? Do not lift anything that is heavier than 10 lb (4.5 kg) or the limit that your doctor tells you. ? Avoid exercise and any movement that takes a lot of effort.  To prevent problems when pooping (constipation): ? Eat a healthy diet. This includes:  Fruits.  Vegetables.  Whole grains. ? Drink 6-8 glasses of water a day. Contact a doctor if: Get help right away if:  You have sudden and very bad pain in your belly.  You have very bad pain in your shoulders or neck.  You have pain that gets worse and is not helped by medicine.  You have: ? A fever or chills. ? Vaginal bleeding. ? Redness or swelling at the site of a surgical cut (incision).  You feel sick to your stomach (nauseous) or you throw up (vomit).  You feel dizzy or weak.  You feel light-headed or you pass out (faint). Summary  An ectopic pregnancy happens when a fertilized egg grows outside the womb (uterus).  If this problem is found early, you may be treated with medicine that stops the egg from growing.  If your tube tears or bursts open (ruptures), you will need surgery. This is an emergency. Get help right away. This information is not intended to replace advice given to you by your health care provider. Make sure you discuss any questions you have with your health care provider. Document Released: 02/02/2009 Document  Revised: 11/30/2016 Document Reviewed: 11/30/2016 Elsevier Interactive Patient Education  2019 [employer].

## 2020-02-12 ENCOUNTER — Ambulatory Visit: Payer: Medicaid Other | Attending: Internal Medicine

## 2020-02-12 DIAGNOSIS — Z23 Encounter for immunization: Secondary | ICD-10-CM

## 2020-02-12 NOTE — Progress Notes (Signed)
   Covid-19 Vaccination Clinic  Name:  Deborah Juarez    MRN: [id number] DOB: 1991/08/10  02/12/2020  Ms. Deborah Juarez was observed post Covid-19 immunization for 15 minutes without incident. She was provided with Vaccine Information Sheet and instruction to access the V-Safe system.   Ms. Deborah Juarez was instructed to call 911 with any severe reactions post vaccine: Marland Kitchen Difficulty breathing  . Swelling of face and throat  . A fast heartbeat  . A bad rash all over body  . Dizziness and weakness   Immunizations Administered    Name Date Dose VIS Date Route   Pfizer COVID-19 Vaccine 02/12/2020  9:38 AM 0.3 mL 10/31/2019 Intramuscular   Manufacturer: [employer], [employer]   Lot: [id number]   NDC: [address]-9

## 2020-03-09 ENCOUNTER — Ambulatory Visit: Payer: Medicaid Other | Attending: Internal Medicine

## 2020-03-09 DIAGNOSIS — Z23 Encounter for immunization: Secondary | ICD-10-CM

## 2020-03-09 NOTE — Progress Notes (Signed)
   Covid-19 Vaccination Clinic  Name:  Deborah Juarez    MRN: [id number] DOB: [birth year]  03/09/2020  [name] was observed post Covid-19 immunization for 15 minutes without incident. She was provided with Vaccine Information Sheet and instruction to access the V-Safe system.   [name] was instructed to call 911 with any severe reactions post vaccine: Marland Kitchen Difficulty breathing  . Swelling of face and throat  . A fast heartbeat  . A bad rash all over body  . Dizziness and weakness   Immunizations Administered    Name Date Dose VIS Date Route   Pfizer COVID-19 Vaccine 03/09/2020  9:24 AM 0.3 mL 01/14/2019 Intramuscular   Manufacturer: [employer], [employer]   Lot: [id number]   NDC: [address]-7

## 2021-01-12 ENCOUNTER — Other Ambulatory Visit: Payer: Self-pay

## 2021-01-12 ENCOUNTER — Encounter (HOSPITAL_COMMUNITY): Payer: Self-pay

## 2021-01-12 ENCOUNTER — Emergency Department (HOSPITAL_COMMUNITY)
Admission: EM | Admit: 2021-01-12 | Discharge: 2021-01-13 | Disposition: A | Discharge status: Home / Self Care | Payer: Medicaid Other | Attending: Emergency Medicine | Admitting: Emergency Medicine

## 2021-01-12 DIAGNOSIS — F1020 Alcohol dependence, uncomplicated: Secondary | ICD-10-CM

## 2021-01-12 DIAGNOSIS — F332 Major depressive disorder, recurrent severe without psychotic features: Secondary | ICD-10-CM

## 2021-01-12 DIAGNOSIS — F322 Major depressive disorder, single episode, severe without psychotic features: Secondary | ICD-10-CM | POA: Insufficient documentation

## 2021-01-12 DIAGNOSIS — J45909 Unspecified asthma, uncomplicated: Secondary | ICD-10-CM | POA: Insufficient documentation

## 2021-01-12 LAB — CBG MONITORING, ED
Glucose-Capillary: 87 mg/dL (ref 70–99)
Glucose-Capillary: 91 mg/dL (ref 70–99)

## 2021-01-12 MED ORDER — SODIUM CHLORIDE 0.9 % IV BOLUS
1000.0000 mL | Freq: Once | INTRAVENOUS | Status: AC
  Administered 2021-01-13: 1000 mL via INTRAVENOUS

## 2021-01-12 NOTE — ED Triage Notes (Signed)
Pt bib [facility] from home. Pt woke up in a sweat and was not making sense. EMS states cbg on their arrival was 77. Pt was given 64mL of D10.  Pt did have an episode of nausea and vomiting after attempting to eat. Pt states she has not felt like eating for past few days and has felt depressed for past two weeks.  Pt denies SI/HI at this time.

## 2021-01-12 NOTE — ED Provider Notes (Signed)
[facility] EMERGENCY DEPARTMENT Provider Note  CSN: [id number] Arrival date & time: 01/12/21 2250  Chief Complaint(s) Hypoglycemia  HPI Deborah Juarez is a 30 y.o. female here for hypoglycemia. Patient was at home, taking a nap. She awoke and felt warm, sweaty, clammy, and lightheaded. She laid on the cold floor which made her feel better while waiting for her husband to return home. When her husband found her, she was reportedly not making sense while speaking. EMS was called and noted her hypoglycemia of 51. She was given D10 en route. She is not a diabetic and does not take any meds. She denies any recent fevers, chills, infections, chest pain, shortness of breath, abd pain, N/V. She did report diarrhea with anxiety. She also admitted to being depressed over the passed few weeks and that she has not been eating for the last few days. She attributes this to the anniversary of the death of a family member from COVID, at which time she and her family were diagnosed with COVID. She also reported that following that period she had an ectopic pregnancy. She admitted to daily ETOH. Drinks approx 1 bottle of wine daily. She denies other illicit drug use. She denies SI/HI or AVH. Reports that her and her husband have discussed seeking therapy now that they have insurance.  HPI  Past Medical History Past Medical History:  Diagnosis Date  . Anxiety   . Asthma   . GERD (gastroesophageal reflux disease)    Patient Active Problem List   Diagnosis Date Noted  . Asthma 03/05/2015  . Anxiety 03/05/2015  . Status post primary low transverse cesarean section--malpresentation 03/03/2015   Home Medication(s) Prior to Admission medications   Medication Sig Start Date End Date Taking? Authorizing Provider  acetaminophen (TYLENOL) 500 MG tablet Take 500 mg by mouth every 6 (six) hours as needed for headache.   Yes [provider]                                                                                                                                     Past Surgical History Past Surgical History:  Procedure Laterality Date  . BREAST SURGERY    . CESAREAN SECTION N/A 03/03/2015   Procedure: CESAREAN SECTION;  Surgeon: Myna Hidalgo, DO;  Location: [facility];  Service: Obstetrics;  Laterality: N/A;   Family History Family History  Problem Relation Age of Onset  . Heart disease Father   . Heart disease Maternal Grandfather   . Heart disease Paternal Grandfather   . Stroke Paternal Grandfather     Social History Social History   Tobacco Use  . Smoking status: Never Smoker  . Smokeless tobacco: Never Used  Substance Use Topics  . Alcohol use: No    Alcohol/week: 3.0 standard drinks    Types: 3 Cans of beer per week  . Drug use: No   Allergies Patient has no known  allergies.  Review of Systems Review of Systems All other systems are reviewed and are negative for acute change except as noted in the HPI  Physical Exam Vital Signs  I have reviewed the triage vital signs BP 128/86   Pulse 89   Temp 98.3 F (36.8 C) (Oral)   Resp 15   Ht 5\' 7"  (1.702 m)   Wt 59 kg   SpO2 100%   BMI 20.36 kg/m   Physical Exam Vitals reviewed.  Constitutional:      General: She is not in acute distress.    Appearance: She is well-developed and well-nourished. She is not diaphoretic.  HENT:     Head: Normocephalic and atraumatic.     Right Ear: External ear normal.     Left Ear: External ear normal.     Nose: Nose normal.  Eyes:     General: No scleral icterus.    Extraocular Movements: EOM normal.     Conjunctiva/sclera: Conjunctivae normal.  Neck:     Trachea: Phonation normal.  Cardiovascular:     Rate and Rhythm: Normal rate and regular rhythm.  Pulmonary:     Effort: Pulmonary effort is normal. No respiratory distress.     Breath sounds: No stridor.  Abdominal:     General: There is no distension.     Tenderness: There is no abdominal  tenderness.  Musculoskeletal:        General: No edema. Normal range of motion.     Cervical back: Normal range of motion.     Right lower leg: No edema.     Left lower leg: No edema.  Neurological:     Mental Status: She is alert and oriented to person, place, and time.  Psychiatric:        Mood and Affect: Mood and affect normal.        Behavior: Behavior normal.     ED Results and Treatments Labs (all labs ordered are listed, but only abnormal results are displayed) Labs Reviewed  CBC WITH DIFFERENTIAL/PLATELET - Abnormal; Notable for the following components:      Result Value   WBC 11.1 (*)    Neutro Abs 8.8 (*)    All other components within normal limits  COMPREHENSIVE METABOLIC PANEL - Abnormal; Notable for the following components:   Potassium 3.4 (*)    CO2 16 (*)    AST 80 (*)    ALT 55 (*)    Anion gap 21 (*)    All other components within normal limits  URINALYSIS, ROUTINE W REFLEX MICROSCOPIC - Abnormal; Notable for the following components:   APPearance HAZY (*)    Ketones, ur 80 (*)    All other components within normal limits  CBG MONITORING, ED - Abnormal; Notable for the following components:   Glucose-Capillary 111 (*)    All other components within normal limits  CBG MONITORING, ED - Abnormal; Notable for the following components:   Glucose-Capillary 147 (*)    All other components within normal limits  LIPASE, BLOOD  CBG MONITORING, ED  CBG MONITORING, ED  I-STAT BETA HCG BLOOD, ED ([facility], [facility], [facility] ONLY)  EKG  EKG Interpretation  Date/Time:    Ventricular Rate:    PR Interval:    QRS Duration:   QT Interval:    QTC Calculation:   R Axis:     Text Interpretation:        Radiology No results found.  Pertinent labs & imaging results that were available during my care of the patient were reviewed by me and considered in my  medical decision making (see chart for details).  Medications Ordered in ED Medications  sodium chloride 0.9 % bolus 1,000 mL (0 mLs Intravenous Stopped 01/13/21 0226)  ondansetron (ZOFRAN) injection 4 mg (4 mg Intravenous Given 01/13/21 0048)                                                                                                                                    Procedures Procedures  (including critical care time)  Medical Decision Making / ED Course I have reviewed the nursing notes for this encounter and the patient's prior records (if available in EHR or on provided paperwork).   Deborah Juarez was evaluated in Emergency Department on 01/13/2021 for the symptoms described in the history of present illness. She was evaluated in the context of the global COVID-19 pandemic, which necessitated consideration that the patient might be at risk for infection with the SARS-CoV-2 virus that causes COVID-19. Institutional protocols and algorithms that pertain to the evaluation of patients at risk for COVID-19 are in a state of rapid change based on information released by regulatory bodies including the CDC and federal and state organizations. These policies and algorithms were followed during the patient's care in the ED.  Hypoglycemia in the setting of decreased food intake and increased ETOH use from depression.  CBG here 91. Appears to be depressed and agreed to speak with [facility]. She exhibits forward thinking and desire to seek help. Does not appear to be a threat to herself at this time. Will let [facility] eval.  Will obtain screening labs, provide food and juice, and IVF. Will reassess.  It took some encouragement the patient has been able to eat.  Labs are notable for evidence of likely starvation ketoacidosis.  No significant electrolyte derangements or renal sufficiency.  Rest of the labs reassuring.  Behavioral health evaluated the patient and reportedly recommended inpatient therapy.   On review of their note no disposition was noted.  I tried contacting them but was unable to.  Regardless patient did not want to be admitted for treatment.  [facility]. She is able to contract for safety and is forward thinking.  She did state that if she feels worse that she would return.  I informed her that behavioral health urgent care is always open.      Final Clinical Impression(s) / ED Diagnoses Final diagnoses:  Severe episode of recurrent major depressive disorder, without psychotic features (HCC)  Alcohol use  disorder, moderate, in controlled environment [facility])   The patient appears reasonably screened and/or stabilized for discharge and I doubt any other medical condition or other [facility] requiring further screening, evaluation, or treatment in the ED at this time prior to discharge. Safe for discharge with strict return precautions.  Disposition: Discharge  Condition: Good  I have discussed the results, Dx and Tx plan with the patient/family who expressed understanding and agree(s) with the plan. Discharge instructions discussed at length. The patient/family was given strict return precautions who verbalized understanding of the instructions. No further questions at time of discharge.    ED Discharge Orders    None      Follow Up: [facility] Address: [address] [place], [place] [address] Hours:  Open 24 hours Phone: [phone] Go to  if symptoms do not improve or  worsen      This chart was dictated using voice recognition software.  Despite best efforts to proofread,  errors can occur which can change the documentation meaning.   Nira Conn, MD 01/13/21 [phone]

## 2021-01-13 ENCOUNTER — Inpatient Hospital Stay (HOSPITAL_COMMUNITY): Admission: AD | Admit: 2021-01-13 | Payer: No Payment, Other | Source: Ambulatory Visit | Admitting: Psychiatry

## 2021-01-13 LAB — COMPREHENSIVE METABOLIC PANEL
ALT: 55 U/L — ABNORMAL HIGH (ref 0–44)
AST: 80 U/L — ABNORMAL HIGH (ref 15–41)
Albumin: 4.3 g/dL (ref 3.5–5.0)
Alkaline Phosphatase: 50 U/L (ref 38–126)
Anion gap: 21 — ABNORMAL HIGH (ref 5–15)
BUN: 7 mg/dL (ref 6–20)
CO2: 16 mmol/L — ABNORMAL LOW (ref 22–32)
Calcium: 9 mg/dL (ref 8.9–10.3)
Chloride: 101 mmol/L (ref 98–111)
Creatinine, Ser: 0.88 mg/dL (ref 0.44–1.00)
GFR, Estimated: >60 mL/min (ref >60)
Glucose, Bld: 99 mg/dL (ref 70–99)
Potassium: 3.4 mmol/L — ABNORMAL LOW (ref 3.5–5.1)
Sodium: 138 mmol/L (ref 135–145)
Total Bilirubin: 0.7 mg/dL (ref 0.3–1.2)
Total Protein: 7.4 g/dL (ref 6.5–8.1)

## 2021-01-13 LAB — CBC WITH DIFFERENTIAL/PLATELET
Abs Immature Granulocytes: 0.06 10*3/uL (ref 0.00–0.07)
Basophils Absolute: 0 10*3/uL (ref 0.0–0.1)
Basophils Relative: 0 %
Eosinophils Absolute: 0 10*3/uL (ref 0.0–0.5)
Eosinophils Relative: 0 %
HCT: 40.4 % (ref 36.0–46.0)
Hemoglobin: 12.3 g/dL (ref 12.0–15.0)
Immature Granulocytes: 1 %
Lymphocytes Relative: 14 %
Lymphs Abs: 1.6 10*3/uL (ref 0.7–4.0)
MCH: 30.2 pg (ref 26.0–34.0)
MCHC: 30.4 g/dL (ref 30.0–36.0)
MCV: 99.3 fL (ref 80.0–100.0)
Monocytes Absolute: 0.7 10*3/uL (ref 0.1–1.0)
Monocytes Relative: 6 %
Neutro Abs: 8.8 10*3/uL — ABNORMAL HIGH (ref 1.7–7.7)
Neutrophils Relative %: 79 %
Platelets: 264 10*3/uL (ref 150–400)
RBC: 4.07 MIL/uL (ref 3.87–5.11)
RDW: 13.4 % (ref 11.5–15.5)
WBC: 11.1 10*3/uL — ABNORMAL HIGH (ref 4.0–10.5)
nRBC: 0 % (ref 0.0–0.2)

## 2021-01-13 LAB — URINALYSIS, ROUTINE W REFLEX MICROSCOPIC
Bilirubin Urine: NEGATIVE
Glucose, UA: NEGATIVE mg/dL
Hgb urine dipstick: NEGATIVE
Ketones, ur: 80 mg/dL — AB
Leukocytes,Ua: NEGATIVE
Nitrite: NEGATIVE
Protein, ur: NEGATIVE mg/dL
Specific Gravity, Urine: 1.016 (ref 1.005–1.030)
pH: 5 (ref 5.0–8.0)

## 2021-01-13 LAB — CBG MONITORING, ED
Glucose-Capillary: 111 mg/dL — ABNORMAL HIGH (ref 70–99)
Glucose-Capillary: 147 mg/dL — ABNORMAL HIGH (ref 70–99)

## 2021-01-13 LAB — LIPASE, BLOOD: Lipase: 43 U/L (ref 11–51)

## 2021-01-13 LAB — I-STAT BETA HCG BLOOD, ED (MC, WL, AP ONLY): I-stat hCG, quantitative: <5 m[IU]/mL (ref <5)

## 2021-01-13 MED ORDER — ONDANSETRON HCL 4 MG/2ML IJ SOLN
4.0000 mg | Freq: Once | INTRAMUSCULAR | Status: AC
  Administered 2021-01-13: 4 mg via INTRAVENOUS
  Filled 2021-01-13: qty 2
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
## 2021-01-13 NOTE — ED Notes (Signed)
Pt resting, denies any needs or concerns at this time.

## 2021-01-13 NOTE — ED Notes (Signed)
Pt talking via TTS.

## 2021-01-13 NOTE — ED Notes (Signed)
Pt was able to eat some crackers and applesauce.

## 2021-01-13 NOTE — ED Notes (Signed)
Speaking with [facility] to arrange TTS for patient.

## 2021-04-12 ENCOUNTER — Other Ambulatory Visit: Payer: Self-pay

## 2021-04-12 ENCOUNTER — Emergency Department (HOSPITAL_COMMUNITY)
Admission: EM | Admit: 2021-04-12 | Discharge: 2021-04-12 | Disposition: A | Discharge status: Home / Self Care | Payer: No Typology Code available for payment source | Attending: Emergency Medicine | Admitting: Emergency Medicine

## 2021-04-12 ENCOUNTER — Encounter (HOSPITAL_COMMUNITY): Payer: Self-pay

## 2021-04-12 ENCOUNTER — Emergency Department (HOSPITAL_COMMUNITY): Payer: No Typology Code available for payment source

## 2021-04-12 DIAGNOSIS — F419 Anxiety disorder, unspecified: Secondary | ICD-10-CM | POA: Insufficient documentation

## 2021-04-12 DIAGNOSIS — J45909 Unspecified asthma, uncomplicated: Secondary | ICD-10-CM | POA: Diagnosis not present

## 2021-04-12 DIAGNOSIS — R079 Chest pain, unspecified: Secondary | ICD-10-CM | POA: Diagnosis present

## 2021-04-12 LAB — CBC
HCT: 37.7 % (ref 36.0–46.0)
Hemoglobin: 12 g/dL (ref 12.0–15.0)
MCH: 31 pg (ref 26.0–34.0)
MCHC: 31.8 g/dL (ref 30.0–36.0)
MCV: 97.4 fL (ref 80.0–100.0)
Platelets: 222 10*3/uL (ref 150–400)
RBC: 3.87 MIL/uL (ref 3.87–5.11)
RDW: 12.9 % (ref 11.5–15.5)
WBC: 5.5 10*3/uL (ref 4.0–10.5)
nRBC: 0 % (ref 0.0–0.2)

## 2021-04-12 LAB — BASIC METABOLIC PANEL
Anion gap: 8 (ref 5–15)
BUN: 7 mg/dL (ref 6–20)
CO2: 28 mmol/L (ref 22–32)
Calcium: 9.2 mg/dL (ref 8.9–10.3)
Chloride: 103 mmol/L (ref 98–111)
Creatinine, Ser: 0.85 mg/dL (ref 0.44–1.00)
GFR, Estimated: >60 mL/min (ref >60)
Glucose, Bld: 85 mg/dL (ref 70–99)
Potassium: 4.2 mmol/L (ref 3.5–5.1)
Sodium: 139 mmol/L (ref 135–145)

## 2021-04-12 LAB — I-STAT BETA HCG BLOOD, ED (MC, WL, AP ONLY): I-stat hCG, quantitative: <5 m[IU]/mL (ref <5)

## 2021-04-12 LAB — TROPONIN I (HIGH SENSITIVITY): Troponin I (High Sensitivity): <2 ng/L (ref <18)

## 2021-04-12 MED ORDER — HYDROXYZINE HCL 25 MG PO TABS: 25.0000 mg | ORAL_TABLET | Freq: Three times a day (TID) | ORAL | 0 refills | Status: DC | PRN

## 2021-04-12 NOTE — ED Triage Notes (Signed)
Patient reports that she began having intermittent chest pain since last night. Patient reports that she has been under a lot of stress, but this pain feels different than pain that she normally has with her anxiety.

## 2021-04-12 NOTE — ED Provider Notes (Signed)
[facility]-EMERGENCY DEPT Provider Note   CSN: [id number] Arrival date & time: 04/12/21  1004     History Chief Complaint  Patient presents with  . Chest Pain    Deborah Juarez is a 30 y.o. female.  HPI Patient with chest pain.  Comes and goes.  Last few seconds.  Sharp in the mid chest.  States she has a history of anxiety but she is anxious but states this feels different.  No shortness of breath.  No cough.  States she is under a lot of stress.  No swelling her legs.  Father did have a heart attack but was greater than 67 years old.  Patient does not smoke.  No known cardiac history.  No recent travel.    Past Medical History:  Diagnosis Date  . Anxiety   . Asthma   . GERD (gastroesophageal reflux disease)     Patient Active Problem List   Diagnosis Date Noted  . Asthma 03/05/2015  . Anxiety 03/05/2015  . Status post primary low transverse cesarean section--malpresentation 03/03/2015    Past Surgical History:  Procedure Laterality Date  . BREAST SURGERY    . CESAREAN SECTION N/A 03/03/2015   Procedure: CESAREAN SECTION;  Surgeon: Myna Hidalgo, DO;  Location: [facility];  Service: Obstetrics;  Laterality: N/A;     OB History    Gravida  2   Para  1   Term      Preterm  1   AB      Living  1     SAB      IAB      Ectopic      Multiple  0   Live Births  1           Family History  Problem Relation Age of Onset  . Heart disease Father   . Heart disease Maternal Grandfather   . Heart disease Paternal Grandfather   . Stroke Paternal Grandfather     Social History   Tobacco Use  . Smoking status: Never Smoker  . Smokeless tobacco: Never Used  Vaping Use  . Vaping Use: Never used  Substance Use Topics  . Alcohol use: No    Alcohol/week: 3.0 standard drinks    Types: 3 Cans of beer per week  . Drug use: No    Home Medications Prior to Admission medications   Medication Sig Start Date End Date Taking? Authorizing  Provider  hydrOXYzine (ATARAX/VISTARIL) 25 MG tablet Take 1 tablet (25 mg total) by mouth every 8 (eight) hours as needed. 04/12/21  Yes Benjiman Core, MD  acetaminophen (TYLENOL) 500 MG tablet Take 500 mg by mouth every 6 (six) hours as needed for headache.    [provider]    Allergies    Patient has no known allergies.  Review of Systems   Review of Systems  Constitutional: Negative for appetite change and fever.  HENT: Negative for congestion.   Respiratory: Negative for shortness of breath.   Cardiovascular: Positive for chest pain. Negative for palpitations.  Gastrointestinal: Negative for abdominal pain.  Genitourinary: Negative for flank pain.  Musculoskeletal: Negative for back pain.  Skin: Negative for rash.  Neurological: Negative for weakness.  Psychiatric/Behavioral: The patient is nervous/anxious.     Physical Exam Updated Vital Signs BP 116/86   Pulse 81   Temp 98 F (36.7 C) (Oral)   Resp 18   Ht 5\' 7"  (1.702 m)   Wt 57.6 kg  LMP 04/04/2021   SpO2 100%   BMI 19.89 kg/m   Physical Exam Vitals and nursing note reviewed.  HENT:     Head: Normocephalic.  Cardiovascular:     Rate and Rhythm: Normal rate and regular rhythm.     Heart sounds: No murmur heard.   Pulmonary:     Breath sounds: No wheezing, rhonchi or rales.  Chest:     Chest wall: No tenderness.  Abdominal:     Tenderness: There is no abdominal tenderness.  Musculoskeletal:     Right lower leg: No edema.     Left lower leg: No edema.  Skin:    General: Skin is warm.     Capillary Refill: Capillary refill takes less than 2 seconds.  Neurological:     Mental Status: She is alert and oriented to person, place, and time.     ED Results / Procedures / Treatments   Labs (all labs ordered are listed, but only abnormal results are displayed) Labs Reviewed  BASIC METABOLIC PANEL  CBC  I-STAT BETA HCG BLOOD, ED ([facility], [facility], [facility] ONLY)  TROPONIN I (HIGH SENSITIVITY)     EKG EKG Interpretation  Date/Time:  Tuesday Apr 12 2021 10:59:51 EDT Ventricular Rate:  86 PR Interval:  135 QRS Duration: 83 QT Interval:  369 QTC Calculation: 442 R Axis:   97 Text Interpretation: Sinus rhythm Borderline right axis deviation Borderline T abnormalities, anterior leads Baseline wander in lead(s) II aVR 12 Lead; Mason-Likar Confirmed by Benjiman Core [id number]) on 04/12/2021 11:03:47 AM   Radiology DG Chest 2 View  Result Date: 04/12/2021 CLINICAL DATA:  Chest pain EXAM: CHEST - 2 VIEW COMPARISON:  None. FINDINGS: The heart size and mediastinal contours are within normal limits. Both lungs are clear. The visualized skeletal structures are unremarkable. IMPRESSION: No active cardiopulmonary disease. Electronically Signed   By: Marnee Spring M.D.   On: 04/12/2021 11:05    Procedures Procedures   Medications Ordered in ED Medications - No data to display  ED Course  I have reviewed the triage vital signs and the nursing notes.  Pertinent labs & imaging results that were available during my care of the patient were reviewed by me and considered in my medical decision making (see chart for details).    MDM Rules/Calculators/A&P                          Patient presents with chest pain.  Comes and goes.  Anterior chest.  Last few seconds and goes away.  Patient is worried that it may be related to anxiety.  States she is under a lot of stress at this time.  EKG reassuring.  Troponin negative.  Chest x-ray reassuring.  Do not think that we need further work-up this time.  Doubt pulmonary embolism.  I think there likely is a anxiety component.  Discharge home with some Atarax.  Outpatient follow-up as needed peer Final Clinical Impression(s) / ED Diagnoses Final diagnoses:  Nonspecific chest pain  Anxiety    Rx / DC Orders ED Discharge Orders         Ordered    hydrOXYzine (ATARAX/VISTARIL) 25 MG tablet  Every 8 hours PRN        04/12/21 1341            Benjiman Core, MD 04/12/21 1654

## 2021-04-12 NOTE — ED Triage Notes (Signed)
Patient

## 2021-11-20 NOTE — L&D Delivery Note (Signed)
Delivery Note At 1:21 PM a viable female was delivered via Vaginal, Spontaneous (Presentation:   Occiput Anterior).  APGAR: 9, 9; weight 6 lb 6.3 oz (2900 g).   Placenta status: Spontaneous, Intact.  Cord:   with the following complications: None.  Cord pH: NA  Anesthesia: Epidural Episiotomy: None Lacerations: 2nd degree;Perineal Suture Repair: 3.0 vicryl Est. Blood Loss (mL): 275  Mom to postpartum.  Baby to Couplet care / Skin to Skin.  Christophe Louis 09/05/2022, 5:30 PM

## 2022-04-06 LAB — OB RESULTS CONSOLE HIV ANTIBODY (ROUTINE TESTING): HIV: NONREACTIVE

## 2022-04-06 LAB — OB RESULTS CONSOLE RPR: RPR: NONREACTIVE

## 2022-04-06 LAB — OB RESULTS CONSOLE RUBELLA ANTIBODY, IGM: Rubella: IMMUNE

## 2022-04-06 LAB — OB RESULTS CONSOLE HEPATITIS B SURFACE ANTIGEN: Hepatitis B Surface Ag: NEGATIVE

## 2022-04-12 ENCOUNTER — Other Ambulatory Visit: Payer: Self-pay | Admitting: Obstetrics and Gynecology

## 2022-04-12 ENCOUNTER — Other Ambulatory Visit (HOSPITAL_COMMUNITY)
Admission: RE | Admit: 2022-04-12 | Discharge: 2022-04-12 | Disposition: A | Discharge status: Home / Self Care | Payer: Medicaid Other | Source: Ambulatory Visit | Attending: Obstetrics and Gynecology | Admitting: Obstetrics and Gynecology

## 2022-04-12 DIAGNOSIS — Z3490 Encounter for supervision of normal pregnancy, unspecified, unspecified trimester: Secondary | ICD-10-CM | POA: Diagnosis present

## 2022-04-12 LAB — OB RESULTS CONSOLE GC/CHLAMYDIA
Chlamydia: NEGATIVE
Neisseria Gonorrhea: NEGATIVE

## 2022-04-14 LAB — CYTOLOGY - PAP
Comment: NEGATIVE
Diagnosis: NEGATIVE
High risk HPV: NEGATIVE

## 2022-04-18 ENCOUNTER — Encounter (HOSPITAL_COMMUNITY): Payer: Self-pay | Admitting: Obstetrics and Gynecology

## 2022-04-18 ENCOUNTER — Inpatient Hospital Stay (HOSPITAL_COMMUNITY)
Admission: AD | Admit: 2022-04-18 | Discharge: 2022-04-19 | Disposition: A | Discharge status: Home / Self Care | Payer: No Typology Code available for payment source | Attending: Obstetrics and Gynecology | Admitting: Obstetrics and Gynecology

## 2022-04-18 DIAGNOSIS — Z3A19 19 weeks gestation of pregnancy: Secondary | ICD-10-CM | POA: Insufficient documentation

## 2022-04-18 DIAGNOSIS — O219 Vomiting of pregnancy, unspecified: Secondary | ICD-10-CM | POA: Diagnosis present

## 2022-04-18 DIAGNOSIS — E876 Hypokalemia: Secondary | ICD-10-CM | POA: Diagnosis not present

## 2022-04-18 DIAGNOSIS — O99012 Anemia complicating pregnancy, second trimester: Secondary | ICD-10-CM | POA: Diagnosis not present

## 2022-04-18 DIAGNOSIS — R197 Diarrhea, unspecified: Secondary | ICD-10-CM | POA: Diagnosis not present

## 2022-04-18 DIAGNOSIS — O99282 Endocrine, nutritional and metabolic diseases complicating pregnancy, second trimester: Secondary | ICD-10-CM | POA: Insufficient documentation

## 2022-04-18 DIAGNOSIS — O26892 Other specified pregnancy related conditions, second trimester: Secondary | ICD-10-CM | POA: Insufficient documentation

## 2022-04-18 LAB — BASIC METABOLIC PANEL
Anion gap: 9 (ref 5–15)
BUN: <5 mg/dL — ABNORMAL LOW (ref 6–20)
CO2: 23 mmol/L (ref 22–32)
Calcium: 8.8 mg/dL — ABNORMAL LOW (ref 8.9–10.3)
Chloride: 103 mmol/L (ref 98–111)
Creatinine, Ser: 0.58 mg/dL (ref 0.44–1.00)
GFR, Estimated: >60 mL/min (ref >60)
Glucose, Bld: 89 mg/dL (ref 70–99)
Potassium: 3.2 mmol/L — ABNORMAL LOW (ref 3.5–5.1)
Sodium: 135 mmol/L (ref 135–145)

## 2022-04-18 LAB — CBC WITH DIFFERENTIAL/PLATELET
Abs Immature Granulocytes: 0.03 10*3/uL (ref 0.00–0.07)
Basophils Absolute: 0 10*3/uL (ref 0.0–0.1)
Basophils Relative: 0 %
Eosinophils Absolute: 0 10*3/uL (ref 0.0–0.5)
Eosinophils Relative: 0 %
HCT: 28.3 % — ABNORMAL LOW (ref 36.0–46.0)
Hemoglobin: 9.4 g/dL — ABNORMAL LOW (ref 12.0–15.0)
Immature Granulocytes: 0 %
Lymphocytes Relative: 29 %
Lymphs Abs: 2 10*3/uL (ref 0.7–4.0)
MCH: 29.7 pg (ref 26.0–34.0)
MCHC: 33.2 g/dL (ref 30.0–36.0)
MCV: 89.3 fL (ref 80.0–100.0)
Monocytes Absolute: 0.6 10*3/uL (ref 0.1–1.0)
Monocytes Relative: 9 %
Neutro Abs: 4.2 10*3/uL (ref 1.7–7.7)
Neutrophils Relative %: 62 %
Platelets: 204 10*3/uL (ref 150–400)
RBC: 3.17 MIL/uL — ABNORMAL LOW (ref 3.87–5.11)
RDW: 12.5 % (ref 11.5–15.5)
WBC: 6.8 10*3/uL (ref 4.0–10.5)
nRBC: 0 % (ref 0.0–0.2)

## 2022-04-18 LAB — URINALYSIS, ROUTINE W REFLEX MICROSCOPIC
Bilirubin Urine: NEGATIVE
Glucose, UA: NEGATIVE mg/dL
Hgb urine dipstick: NEGATIVE
Ketones, ur: 80 mg/dL — AB
Leukocytes,Ua: NEGATIVE
Nitrite: NEGATIVE
Protein, ur: 100 mg/dL — AB
Specific Gravity, Urine: 1.026 (ref 1.005–1.030)
pH: 7 (ref 5.0–8.0)

## 2022-04-18 MED ORDER — SODIUM CHLORIDE 0.9 % IV SOLN
8.0000 mg | Freq: Once | INTRAVENOUS | Status: AC
  Administered 2022-04-18: 8 mg via INTRAVENOUS
  Filled 2022-04-18: qty 4

## 2022-04-18 MED ORDER — FAMOTIDINE IN NACL 20-0.9 MG/50ML-% IV SOLN
20.0000 mg | Freq: Once | INTRAVENOUS | Status: AC
  Administered 2022-04-18: 20 mg via INTRAVENOUS
  Filled 2022-04-18: qty 50

## 2022-04-18 MED ORDER — METOCLOPRAMIDE HCL 5 MG/ML IJ SOLN: 10.0000 mg | Freq: Once | INTRAMUSCULAR | Status: DC

## 2022-04-18 MED ORDER — ONDANSETRON 4 MG PO TBDP: 4.0000 mg | ORAL_TABLET | Freq: Four times a day (QID) | ORAL | 0 refills | Status: DC | PRN

## 2022-04-18 MED ORDER — LACTATED RINGERS IV BOLUS
1000.0000 mL | Freq: Once | INTRAVENOUS | Status: AC
  Administered 2022-04-18: 1000 mL via INTRAVENOUS

## 2022-04-18 NOTE — MAU Note (Signed)
.  Deborah Juarez is a 31 y.o. at [redacted]w[redacted]d here in MAU reporting n/v since 0200. Has had n/v this pregnancy but last few days have been worse and today unable to keep down anything. Denis VB or  LOF. No pain. No nausea meds at home. Had 3 loose stools today but not watery  Onset of complaint: last few days Pain score: 0 Vitals:   04/18/22 2127 04/18/22 2129  BP:  98/66  Pulse: 98   Resp: 16   Temp: 98.5 F (36.9 C)   SpO2: 100%      FHT:140 Lab orders placed from triage: u/a

## 2022-04-18 NOTE — MAU Provider Note (Signed)
History     CSN: [id number]  Arrival date and time: 04/18/22 2118   Event Date/Time   First Provider Initiated Contact with Patient 04/18/22 2156      Chief Complaint  Patient presents with   Emesis During Pregnancy   Deborah Juarez is a 31 y.o. [id number] at [redacted]w[redacted]d who receives care at [facility]. She reports her last appt was on Tuesday Apr 11, 2022.  She presents today for Emesis During Pregnancy.  She states she has been experiencing her symptoms since 0200 and has had > 6 incidents.  She reports that she has attempted to eat, but vomits shortly after.  She also reports she has been unable to keep down water. She endorses fetal movement and denies abdominal cramping or vaginal concerns.  She denies issues with urination or constipation, but reports loose stools that started around the same time.  She denies sick individuals within the home and does not work outside the home.   OB History     Gravida  3   Para  1   Term      Preterm  1   AB  1   Living  1      SAB      IAB      Ectopic  1   Multiple  0   Live Births  1           Past Medical History:  Diagnosis Date   Anxiety    Asthma    GERD (gastroesophageal reflux disease)     Past Surgical History:  Procedure Laterality Date   BREAST SURGERY     CESAREAN SECTION N/A 03/03/2015   Procedure: CESAREAN SECTION;  Surgeon: Janyth Pupa, DO;  Location: [facility];  Service: Obstetrics;  Laterality: N/A;    Family History  Problem Relation Age of Onset   Heart disease Father    Heart disease Maternal Grandfather    Heart disease Paternal Grandfather    Stroke Paternal Grandfather     Social History   Tobacco Use   Smoking status: Never   Smokeless tobacco: Never  Vaping Use   Vaping Use: Never used  Substance Use Topics   Alcohol use: No    Alcohol/week: 3.0 standard drinks    Types: 3 Cans of beer per week   Drug use: No    Allergies: No Known Allergies  Medications Prior to Admission   Medication Sig Dispense Refill Last Dose   acetaminophen (TYLENOL) 500 MG tablet Take 500 mg by mouth every 6 (six) hours as needed for headache.      hydrOXYzine (ATARAX/VISTARIL) 25 MG tablet Take 1 tablet (25 mg total) by mouth every 8 (eight) hours as needed. 8 tablet 0     Review of Systems  Constitutional:  Positive for chills (After vomiting). Negative for fever.  Gastrointestinal:  Positive for diarrhea (Loose Stools), nausea and vomiting. Negative for abdominal pain and constipation.  Genitourinary:  Negative for difficulty urinating, dysuria, vaginal bleeding and vaginal discharge.  Neurological:  Positive for dizziness and headaches (Temporal-tight. Occurs after vomiting and currently subtle. 2/10.). Negative for light-headedness.  Physical Exam   Blood pressure 98/66, pulse 98, temperature 98.5 F (36.9 C), resp. rate 16, height 5\' 7"  (1.702 m), weight 62.6 kg, SpO2 100 %, unknown if currently breastfeeding.  Physical Exam Vitals reviewed.  Constitutional:      Appearance: Normal appearance.  HENT:     Head: Normocephalic and atraumatic.  Eyes:  Conjunctiva/sclera: Conjunctivae normal.  Cardiovascular:     Rate and Rhythm: Normal rate.  Pulmonary:     Effort: Pulmonary effort is normal. No respiratory distress.  Neurological:     Mental Status: She is alert and oriented to person, place, and time.  Psychiatric:        Mood and Affect: Mood normal.        Behavior: Behavior normal.    MAU Course  Procedures Results for orders placed or performed during the hospital encounter of 04/18/22 (from the past 24 hour(s))  Urinalysis, Routine w reflex microscopic Urine, Clean Catch     Status: Abnormal   Collection Time: 04/18/22  9:40 PM  Result Value Ref Range   Color, Urine YELLOW YELLOW   APPearance HAZY (A) CLEAR   Specific Gravity, Urine 1.026 1.005 - 1.030   pH 7.0 5.0 - 8.0   Glucose, UA NEGATIVE NEGATIVE mg/dL   Hgb urine dipstick NEGATIVE NEGATIVE    Bilirubin Urine NEGATIVE NEGATIVE   Ketones, ur 80 (A) NEGATIVE mg/dL   Protein, ur 100 (A) NEGATIVE mg/dL   Nitrite NEGATIVE NEGATIVE   Leukocytes,Ua NEGATIVE NEGATIVE   RBC / HPF 0-5 0 - 5 RBC/hpf   WBC, UA 0-5 0 - 5 WBC/hpf   Bacteria, UA RARE (A) NONE SEEN   Squamous Epithelial / LPF 6-10 0 - 5   Mucus PRESENT   CBC with Differential/Platelet     Status: Abnormal   Collection Time: 04/18/22 10:36 PM  Result Value Ref Range   WBC 6.8 4.0 - 10.5 K/uL   RBC 3.17 (L) 3.87 - 5.11 MIL/uL   Hemoglobin 9.4 (L) 12.0 - 15.0 g/dL   HCT 28.3 (L) 36.0 - 46.0 %   MCV 89.3 80.0 - 100.0 fL   MCH 29.7 26.0 - 34.0 pg   MCHC 33.2 30.0 - 36.0 g/dL   RDW 12.5 11.5 - 15.5 %   Platelets 204 150 - 400 K/uL   nRBC 0.0 0.0 - 0.2 %   Neutrophils Relative % 62 %   Neutro Abs 4.2 1.7 - 7.7 K/uL   Lymphocytes Relative 29 %   Lymphs Abs 2.0 0.7 - 4.0 K/uL   Monocytes Relative 9 %   Monocytes Absolute 0.6 0.1 - 1.0 K/uL   Eosinophils Relative 0 %   Eosinophils Absolute 0.0 0.0 - 0.5 K/uL   Basophils Relative 0 %   Basophils Absolute 0.0 0.0 - 0.1 K/uL   Immature Granulocytes 0 %   Abs Immature Granulocytes 0.03 0.00 - 0.07 K/uL  Basic metabolic panel     Status: Abnormal   Collection Time: 04/18/22 10:36 PM  Result Value Ref Range   Sodium 135 135 - 145 mmol/L   Potassium 3.2 (L) 3.5 - 5.1 mmol/L   Chloride 103 98 - 111 mmol/L   CO2 23 22 - 32 mmol/L   Glucose, Bld 89 70 - 99 mg/dL   BUN <5 (L) 6 - 20 mg/dL   Creatinine, Ser 0.58 0.44 - 1.00 mg/dL   Calcium 8.8 (L) 8.9 - 10.3 mg/dL   GFR, Estimated >60 >60 mL/min   Anion gap 9 5 - 15    MDM Start IV LR Bolus Antiemetic PPI Prescription Assessment and Plan  31 year old, [id number]  SIUP at 19.2 weeks Nausea/Vomiting Diarrhea  -Reviewed POC with patient. -Collect labs as ordered. -Start IV with LR Bolus -Give Zofran IV. -Give Pepcid. -No questions or concerns. -Will observe and reassess for readiness of PO challenge.  Maryann Conners 04/18/2022, 9:56 PM   Reassessment (11:58 PM)  -Nurse reports patient able to tolerate PO challenge and requests discharge. -Labs notable for anemia and mild hypokalemia.  -Iron supplement sent to pharmacy on file. -Rx for Zofran also sent to pharmacy on file.  -Information for potassium rich food provided in AVS. -Patient to follow up as scheduled. -Encouraged to call primary office or return to MAU if symptoms worsen or with the onset of new symptoms. -Discharged to home in improved condition.  Maryann Conners MSN, CNM Advanced Practice Provider, [facility] for [employer]

## 2022-04-19 MED ORDER — FERROUS SULFATE 325 (65 FE) MG PO TBEC: 325.0000 mg | DELAYED_RELEASE_TABLET | ORAL | 1 refills | Status: DC

## 2022-05-13 ENCOUNTER — Inpatient Hospital Stay (HOSPITAL_COMMUNITY)
Admission: AD | Admit: 2022-05-13 | Discharge: 2022-05-13 | Disposition: A | Discharge status: Home / Self Care | Payer: No Typology Code available for payment source | Attending: Obstetrics and Gynecology | Admitting: Obstetrics and Gynecology

## 2022-05-13 ENCOUNTER — Encounter (HOSPITAL_COMMUNITY): Payer: Self-pay | Admitting: Obstetrics and Gynecology

## 2022-05-13 DIAGNOSIS — J310 Chronic rhinitis: Secondary | ICD-10-CM | POA: Diagnosis not present

## 2022-05-13 DIAGNOSIS — O99612 Diseases of the digestive system complicating pregnancy, second trimester: Secondary | ICD-10-CM | POA: Insufficient documentation

## 2022-05-13 DIAGNOSIS — Z3A22 22 weeks gestation of pregnancy: Secondary | ICD-10-CM | POA: Insufficient documentation

## 2022-05-13 DIAGNOSIS — O99512 Diseases of the respiratory system complicating pregnancy, second trimester: Secondary | ICD-10-CM | POA: Insufficient documentation

## 2022-05-13 DIAGNOSIS — O99012 Anemia complicating pregnancy, second trimester: Secondary | ICD-10-CM | POA: Diagnosis not present

## 2022-05-13 DIAGNOSIS — J45909 Unspecified asthma, uncomplicated: Secondary | ICD-10-CM | POA: Diagnosis not present

## 2022-05-13 DIAGNOSIS — J309 Allergic rhinitis, unspecified: Secondary | ICD-10-CM | POA: Diagnosis not present

## 2022-05-13 DIAGNOSIS — R0982 Postnasal drip: Secondary | ICD-10-CM | POA: Insufficient documentation

## 2022-05-13 DIAGNOSIS — O26892 Other specified pregnancy related conditions, second trimester: Secondary | ICD-10-CM | POA: Diagnosis present

## 2022-05-13 LAB — URINALYSIS, ROUTINE W REFLEX MICROSCOPIC
Bilirubin Urine: NEGATIVE
Glucose, UA: NEGATIVE mg/dL
Hgb urine dipstick: NEGATIVE
Ketones, ur: 5 mg/dL — AB
Leukocytes,Ua: NEGATIVE
Nitrite: NEGATIVE
Protein, ur: NEGATIVE mg/dL
Specific Gravity, Urine: 1.006 (ref 1.005–1.030)
pH: 7 (ref 5.0–8.0)

## 2022-05-13 LAB — GLUCOSE, CAPILLARY: Glucose-Capillary: 115 mg/dL — ABNORMAL HIGH (ref 70–99)

## 2022-05-13 MED ORDER — PSEUDOEPHEDRINE HCL 30 MG PO TABS: 60.0000 mg | ORAL_TABLET | Freq: Four times a day (QID) | ORAL | 0 refills | Status: DC | PRN

## 2022-05-13 NOTE — MAU Provider Note (Signed)
History     CSN: [id number]  Arrival date and time: 05/13/22 2114   Event Date/Time   First Provider Initiated Contact with Patient 05/13/22 2236      Chief Complaint  Patient presents with   Dizziness   Deborah Juarez is a 31 y.o. [id number] at [redacted]w[redacted]d who receives care at [facility].  She had an appt on Tuesday.   She presents today for Dizziness.  She states she was laying down, watching tv, and eating strawberries.  She states she started to feel dizzy and some SOB.  She also reports cough that has been present for a week.  She states she has not had any other dizziness or SOB prior to this incident and is not currently experiencing these symptoms.  She denies being around sick individuals and endorses having seasonal allergies.  She takes Flonase stating she has more of a "sinus problem." She endorses fetal movement and denies vaginal bleeding, discharge, or leaking. She reports eating a veggie dog and fries around 5pm. She is not taking her iron supplement.     OB History     Gravida  3   Para  1   Term      Preterm  1   AB  1   Living  1      SAB      IAB      Ectopic  1   Multiple  0   Live Births  1           Past Medical History:  Diagnosis Date   Anxiety    Asthma    GERD (gastroesophageal reflux disease)     Past Surgical History:  Procedure Laterality Date   BREAST SURGERY     CESAREAN SECTION N/A 03/03/2015   Procedure: CESAREAN SECTION;  Surgeon: Myna Hidalgo, DO;  Location: [facility];  Service: Obstetrics;  Laterality: N/A;    Family History  Problem Relation Age of Onset   Heart disease Father    Heart disease Maternal Grandfather    Heart disease Paternal Grandfather    Stroke Paternal Grandfather     Social History   Tobacco Use   Smoking status: Never   Smokeless tobacco: Never  Vaping Use   Vaping Use: Never used  Substance Use Topics   Alcohol use: No    Alcohol/week: 3.0 standard drinks of alcohol    Types: 3 Cans of  beer per week   Drug use: No    Allergies: No Known Allergies  Medications Prior to Admission  Medication Sig Dispense Refill Last Dose   ondansetron (ZOFRAN-ODT) 4 MG disintegrating tablet Take 1-2 tablets (4-8 mg total) by mouth every 6 (six) hours as needed for nausea or vomiting. 20 tablet 0 05/13/2022 at 1530   Prenatal Vit-Fe Fumarate-FA (PRENATAL VITAMIN PO) Take 1 tablet by mouth daily.   05/13/2022   acetaminophen (TYLENOL) 500 MG tablet Take 500 mg by mouth every 6 (six) hours as needed for headache.      ferrous sulfate 325 (65 FE) MG EC tablet Take 1 tablet (325 mg total) by mouth every other day. 45 tablet 1     Review of Systems  Constitutional:  Negative for chills and fever.  HENT:  Positive for postnasal drip and sneezing. Negative for congestion, sinus pressure, sinus pain and sore throat.   Respiratory:  Positive for cough and shortness of breath (One incident, none currently).   Gastrointestinal:  Negative for abdominal pain, nausea and vomiting.  Genitourinary:  Negative for difficulty urinating, dysuria, vaginal bleeding and vaginal discharge.  Neurological:  Positive for dizziness (None currently), light-headedness (None currently) and headaches (3/10-Frontal "More like a sinus HA.").   Physical Exam   Blood pressure 107/65, pulse 78, temperature 97.9 F (36.6 C), temperature source Oral, resp. rate 16, height 5\' 7"  (1.702 m), weight 64.7 kg, SpO2 100 %, unknown if currently breastfeeding.  Physical Exam Vitals reviewed.  Constitutional:      Appearance: Normal appearance.  HENT:     Head: Normocephalic and atraumatic.     Right Ear: Hearing and tympanic membrane normal.     Left Ear: Hearing and tympanic membrane normal.     Nose: Mucosal edema and congestion present.     Right Turbinates: Swollen and pale.     Left Turbinates: Swollen and pale.     Right Sinus: Frontal sinus tenderness present.     Left Sinus: Frontal sinus tenderness present.      Mouth/Throat:     Mouth: Mucous membranes are moist.     Tonsils: No tonsillar exudate or tonsillar abscesses.  Eyes:     Conjunctiva/sclera: Conjunctivae normal.  Cardiovascular:     Rate and Rhythm: Normal rate and regular rhythm.     Heart sounds: Normal heart sounds.  Pulmonary:     Effort: Pulmonary effort is normal. No respiratory distress.     Breath sounds: Normal breath sounds. No wheezing.  Chest:     Chest wall: No tenderness.  Abdominal:     General: Bowel sounds are normal.     Palpations: Abdomen is soft.     Tenderness: There is no abdominal tenderness.  Musculoskeletal:        General: Normal range of motion.     Cervical back: Normal range of motion.  Skin:    General: Skin is warm and dry.  Neurological:     General: No focal deficit present.     Mental Status: She is alert and oriented to person, place, and time.  Psychiatric:        Mood and Affect: Mood normal.        Behavior: Behavior normal.     MAU Course  Procedures Results for orders placed or performed during the hospital encounter of 05/13/22 (from the past 24 hour(s))  Urinalysis, Routine w reflex microscopic Urine, Clean Catch     Status: Abnormal   Collection Time: 05/13/22  9:43 PM  Result Value Ref Range   Color, Urine YELLOW YELLOW   APPearance HAZY (A) CLEAR   Specific Gravity, Urine 1.006 1.005 - 1.030   pH 7.0 5.0 - 8.0   Glucose, UA NEGATIVE NEGATIVE mg/dL   Hgb urine dipstick NEGATIVE NEGATIVE   Bilirubin Urine NEGATIVE NEGATIVE   Ketones, ur 5 (A) NEGATIVE mg/dL   Protein, ur NEGATIVE NEGATIVE mg/dL   Nitrite NEGATIVE NEGATIVE   Leukocytes,Ua NEGATIVE NEGATIVE  Glucose, capillary     Status: Abnormal   Collection Time: 05/13/22  9:55 PM  Result Value Ref Range   Glucose-Capillary 115 (H) 70 - 99 mg/dL    MDM CBG Exam Assessment and Plan  31 year old G3P0111 SIUP at 22.6 weeks Dizziness Allergic Rhinitis with Postnasal drip Anemia  -Reviewed possible causes for  bout of dizziness including anemia, hypoglycemia, hypotension. -Informed that provider reassured that symptoms were isolated incident and have not occurred again or previously. -Discussed need to take iron supplement as prescribed. -Reviewed rhinitis symptoms and treatment.  Discussed continued usage of  flonase, but also suggested initiation of antihistamine like claritin or zyrtec.  Also discussed usage of sudafed for nasal congestion. -Patient verbalizes understanding and requests prescription for sudafed. -Rx sent to pharmacy on file.  -Discussed continued monitoring and reporting of ongoing or new symptoms. -Reassured that no further evaluation necessary today. -Discharged to home in stable condition.   Cherre Robins 05/13/2022, 10:36 PM

## 2022-08-17 LAB — OB RESULTS CONSOLE GBS: GBS: NEGATIVE

## 2022-08-25 ENCOUNTER — Encounter (HOSPITAL_COMMUNITY): Payer: Self-pay | Admitting: Obstetrics & Gynecology

## 2022-08-25 ENCOUNTER — Inpatient Hospital Stay (HOSPITAL_COMMUNITY)
Admission: AD | Admit: 2022-08-25 | Discharge: 2022-08-26 | Disposition: A | Discharge status: Home / Self Care | Payer: No Typology Code available for payment source | Attending: Obstetrics & Gynecology | Admitting: Obstetrics & Gynecology

## 2022-08-25 DIAGNOSIS — Z3689 Encounter for other specified antenatal screening: Secondary | ICD-10-CM

## 2022-08-25 DIAGNOSIS — O368190 Decreased fetal movements, unspecified trimester, not applicable or unspecified: Secondary | ICD-10-CM | POA: Diagnosis not present

## 2022-08-25 DIAGNOSIS — Z3A37 37 weeks gestation of pregnancy: Secondary | ICD-10-CM | POA: Diagnosis not present

## 2022-08-25 DIAGNOSIS — O368130 Decreased fetal movements, third trimester, not applicable or unspecified: Secondary | ICD-10-CM | POA: Diagnosis present

## 2022-08-25 DIAGNOSIS — O471 False labor at or after 37 completed weeks of gestation: Secondary | ICD-10-CM | POA: Diagnosis present

## 2022-08-25 MED ORDER — PSEUDOEPHEDRINE HCL 30 MG PO TABS
60.0000 mg | ORAL_TABLET | Freq: Once | ORAL | Status: AC
  Administered 2022-08-25: 60 mg via ORAL
  Filled 2022-08-25: qty 2

## 2022-08-25 NOTE — MAU Note (Signed)
.  Deborah Juarez is a 31 y.o. at [redacted]w[redacted]d here in MAU reporting: irregular ctx starting around 1200 that are now 10 minutes apart (3/10). She is also having a HA that is between and below her eyes that she reports feels like a sinus headache (3/10). Reports DFM since 1830 or 1900, she reports that movement is less than usual. She has felt movements approximately five times in the last two hours. Denies VB or LOF. She had a previous C/S for breech presentation and is planning on  a TOLAC. LMP: N/A Onset of complaint: today at 1200 Pain score: 3/10 Vitals:   08/25/22 2227  BP: 109/73  Pulse: (!) 106  Resp: 16  Temp: 98.2 F (36.8 C)  SpO2: 100%     FHT:141 Lab orders placed from triage:  MAU labor

## 2022-08-25 NOTE — MAU Provider Note (Signed)
History     CSN: [id number]  Arrival date and time: 08/25/22 2215   Event Date/Time   First Provider Initiated Contact with Patient 08/25/22 2314      Chief Complaint  Patient presents with   Contractions   Decreased Fetal Movement   Deborah Juarez is a 31 y.o. [id number] at [redacted]w[redacted]d who receives care at [facility].  She presents today for Contractions and Decreased Fetal Movement.  She reports she has not felt movement since 7 PM.  She states she attempted to move baby by eating, showering, and drinking.  She states she is now experiencing movement.  Patient also reports contractions throughout the day that became closer around 1 PM.  She states the contractions are now every 10 to 15 minutes.  She denies vaginal bleeding or loss of fluid.  Request cervical exam today and states that her cervix was closed last week.    OB History     Gravida  3   Para  1   Term      Preterm  1   AB  1   Living  1      SAB      IAB      Ectopic  1   Multiple  0   Live Births  1           Past Medical History:  Diagnosis Date   Anxiety    Asthma    GERD (gastroesophageal reflux disease)     Past Surgical History:  Procedure Laterality Date   BREAST SURGERY     CESAREAN SECTION N/A 03/03/2015   Procedure: CESAREAN SECTION;  Surgeon: Myna Hidalgo, DO;  Location: [facility];  Service: Obstetrics;  Laterality: N/A;    Family History  Problem Relation Age of Onset   Heart disease Father    Heart disease Maternal Grandfather    Heart disease Paternal Grandfather    Stroke Paternal Grandfather     Social History   Tobacco Use   Smoking status: Never   Smokeless tobacco: Never  Vaping Use   Vaping Use: Never used  Substance Use Topics   Alcohol use: No    Alcohol/week: 3.0 standard drinks of alcohol    Types: 3 Cans of beer per week   Drug use: No    Allergies: No Known Allergies  Medications Prior to Admission  Medication Sig Dispense Refill Last Dose    ferrous sulfate 325 (65 FE) MG EC tablet Take 1 tablet (325 mg total) by mouth every other day. 45 tablet 1 08/25/2022   ondansetron (ZOFRAN-ODT) 4 MG disintegrating tablet Take 1-2 tablets (4-8 mg total) by mouth every 6 (six) hours as needed for nausea or vomiting. 20 tablet 0 08/25/2022 at 1300   Prenatal Vit-Fe Fumarate-FA (PRENATAL VITAMIN PO) Take 1 tablet by mouth daily.   08/25/2022   acetaminophen (TYLENOL) 500 MG tablet Take 500 mg by mouth every 6 (six) hours as needed for headache.      pseudoephedrine (SUDAFED) 30 MG tablet Take 2 tablets (60 mg total) by mouth every 6 (six) hours as needed for congestion. 30 tablet 0     Review of Systems  Genitourinary:  Negative for difficulty urinating, vaginal bleeding and vaginal discharge.   Physical Exam   Blood pressure 109/73, pulse (!) 106, temperature 98.2 F (36.8 C), temperature source Oral, resp. rate 16, height 5\' 7"  (1.702 m), weight 67.9 kg, SpO2 100 %, unknown if currently breastfeeding.  Physical Exam Vitals reviewed.  Constitutional:      Appearance: Normal appearance.  HENT:     Head: Normocephalic and atraumatic.  Eyes:     Conjunctiva/sclera: Conjunctivae normal.  Cardiovascular:     Rate and Rhythm: Normal rate.  Abdominal:     Tenderness: There is no abdominal tenderness.     Comments: Gravid, Appears AGA  Musculoskeletal:        General: Normal range of motion.     Cervical back: Normal range of motion.  Skin:    General: Skin is warm and dry.  Neurological:     Mental Status: She is alert and oriented to person, place, and time.  Psychiatric:        Mood and Affect: Mood normal.        Behavior: Behavior normal.     Fetal Assessment 125 bpm, Mod Var, -Decels, +Accels Toco: Irregular  MAU Course  No results found for this or any previous visit (from the past 24 hour(s)). No results found.  MDM PE Labs: None EFM  Assessment and Plan  31 year old G3P0111  SIUP at 37.5 weeks Cat I  FT DFM  -Reassured with fetal movement. -Discussed continued monitoring. -NST reactive -Nurse to perform cervical exam.  If unchanged plan for discharge. -Precautions reviewed  Maryann Conners MSN, CNM 08/25/2022, 11:21 PM

## 2022-09-04 ENCOUNTER — Encounter (HOSPITAL_COMMUNITY): Payer: Self-pay | Admitting: Obstetrics and Gynecology

## 2022-09-04 ENCOUNTER — Inpatient Hospital Stay (HOSPITAL_COMMUNITY)
Admission: AD | Admit: 2022-09-04 | Discharge: 2022-09-04 | Disposition: A | Discharge status: Home / Self Care | Payer: No Typology Code available for payment source | Attending: Obstetrics and Gynecology | Admitting: Obstetrics and Gynecology

## 2022-09-04 DIAGNOSIS — Z3A39 39 weeks gestation of pregnancy: Secondary | ICD-10-CM | POA: Diagnosis not present

## 2022-09-04 DIAGNOSIS — Z3689 Encounter for other specified antenatal screening: Secondary | ICD-10-CM

## 2022-09-04 DIAGNOSIS — O4693 Antepartum hemorrhage, unspecified, third trimester: Secondary | ICD-10-CM | POA: Insufficient documentation

## 2022-09-04 DIAGNOSIS — O471 False labor at or after 37 completed weeks of gestation: Secondary | ICD-10-CM | POA: Insufficient documentation

## 2022-09-04 DIAGNOSIS — O26893 Other specified pregnancy related conditions, third trimester: Secondary | ICD-10-CM | POA: Insufficient documentation

## 2022-09-04 DIAGNOSIS — R030 Elevated blood-pressure reading, without diagnosis of hypertension: Secondary | ICD-10-CM | POA: Insufficient documentation

## 2022-09-04 DIAGNOSIS — Z98891 History of uterine scar from previous surgery: Secondary | ICD-10-CM | POA: Diagnosis not present

## 2022-09-04 DIAGNOSIS — O368190 Decreased fetal movements, unspecified trimester, not applicable or unspecified: Secondary | ICD-10-CM | POA: Insufficient documentation

## 2022-09-04 LAB — CBC
HCT: 33.1 % — ABNORMAL LOW (ref 36.0–46.0)
Hemoglobin: 11 g/dL — ABNORMAL LOW (ref 12.0–15.0)
MCH: 28.4 pg (ref 26.0–34.0)
MCHC: 33.2 g/dL (ref 30.0–36.0)
MCV: 85.5 fL (ref 80.0–100.0)
Platelets: 219 10*3/uL (ref 150–400)
RBC: 3.87 MIL/uL (ref 3.87–5.11)
RDW: 12.4 % (ref 11.5–15.5)
WBC: 8.4 10*3/uL (ref 4.0–10.5)
nRBC: 0 % (ref 0.0–0.2)

## 2022-09-04 LAB — COMPREHENSIVE METABOLIC PANEL
ALT: 9 U/L (ref 0–44)
AST: 16 U/L (ref 15–41)
Albumin: 2.8 g/dL — ABNORMAL LOW (ref 3.5–5.0)
Alkaline Phosphatase: 244 U/L — ABNORMAL HIGH (ref 38–126)
Anion gap: 5 (ref 5–15)
BUN: 5 mg/dL — ABNORMAL LOW (ref 6–20)
CO2: 25 mmol/L (ref 22–32)
Calcium: 8.6 mg/dL — ABNORMAL LOW (ref 8.9–10.3)
Chloride: 104 mmol/L (ref 98–111)
Creatinine, Ser: 0.75 mg/dL (ref 0.44–1.00)
GFR, Estimated: >60 mL/min (ref >60)
Glucose, Bld: 95 mg/dL (ref 70–99)
Potassium: 3.3 mmol/L — ABNORMAL LOW (ref 3.5–5.1)
Sodium: 134 mmol/L — ABNORMAL LOW (ref 135–145)
Total Bilirubin: 0.5 mg/dL (ref 0.3–1.2)
Total Protein: 6.6 g/dL (ref 6.5–8.1)

## 2022-09-04 LAB — POCT FERN TEST: POCT Fern Test: NEGATIVE

## 2022-09-04 NOTE — MAU Provider Note (Addendum)
History     CSN: [id number]  Arrival date and time: 09/04/22 1300   Event Date/Time   First Provider Initiated Contact with Patient 09/04/22 1435      Chief Complaint  Patient presents with   Contractions   Vaginal Bleeding   Vaginal Bleeding  Patient reports minimal vaginal bleeding. Reports that there is some blood when she wipes with toilet paper.   Otherwise says that she has been having contractions since this morning.  She reports less fetal movement but feels there is still at least 5 per hour.   OB History     Gravida  3   Para  1   Term      Preterm  1   AB  1   Living  1      SAB      IAB      Ectopic  1   Multiple  0   Live Births  1           Past Medical History:  Diagnosis Date   Anxiety    Asthma    GERD (gastroesophageal reflux disease)     Past Surgical History:  Procedure Laterality Date   BREAST SURGERY     CESAREAN SECTION N/A 03/03/2015   Procedure: CESAREAN SECTION;  Surgeon: Janyth Pupa, DO;  Location: [facility];  Service: Obstetrics;  Laterality: N/A;    Family History  Problem Relation Age of Onset   Cancer Father        Pacreatic Ca   Heart disease Father    Heart disease Maternal Grandfather    Heart disease Paternal Grandfather    Stroke Paternal Grandfather     Social History   Tobacco Use   Smoking status: Never   Smokeless tobacco: Never  Vaping Use   Vaping Use: Never used  Substance Use Topics   Alcohol use: Not Currently    Alcohol/week: 3.0 standard drinks of alcohol    Types: 3 Cans of beer per week   Drug use: No    Allergies: No Known Allergies  Medications Prior to Admission  Medication Sig Dispense Refill Last Dose   ferrous sulfate 325 (65 FE) MG EC tablet Take 1 tablet (325 mg total) by mouth every other day. 45 tablet 1 09/04/2022   ondansetron (ZOFRAN-ODT) 4 MG disintegrating tablet Take 1-2 tablets (4-8 mg total) by mouth every 6 (six) hours as needed for nausea or vomiting. 20  tablet 0 09/04/2022   Prenatal Vit-Fe Fumarate-FA (PRENATAL VITAMIN PO) Take 1 tablet by mouth daily.   09/04/2022   pseudoephedrine (SUDAFED) 30 MG tablet Take 2 tablets (60 mg total) by mouth every 6 (six) hours as needed for congestion. 30 tablet 0 Past Month   acetaminophen (TYLENOL) 500 MG tablet Take 500 mg by mouth every 6 (six) hours as needed for headache.       Review of Systems  Genitourinary:  Positive for vaginal bleeding.   Physical Exam   Blood pressure 114/76, pulse 81, temperature 99.1 F (37.3 C), temperature source Oral, resp. rate 17, height 5\' 7"  (1.702 m), weight 69.1 kg, SpO2 100 %, unknown if currently breastfeeding.  Physical Exam  MAU Course  Procedures NST reactive  Fern test negative  CBC, CMP wnl    Assessment and Plan  Vaginal bleeding  - minimal vaginal bleeding is un concerning. Most likely in the setting of cervical change.  - Return precautions provided   Decreased fetal movement  Patient still reports  adequate movement and NST is reactive  - Return precautions provided   Early labor  Patient is having contractions, but is still in latent labor. Stayed at one centimeter dilation in the MAU. Minimal change in effacement.  - Return precautions given   Elevated Blood Pressure  No h/o PreE. Elevated pressures in MAU. Pre E labs CBC, CMP wnl. No SF symptomatically.  - Continue to monitor BP at home  - F/u with OB  - Return precautions given   Deborah Juarez 09/04/2022, 2:47 PM   Attestation of Supervision of Student:  I confirm that I have verified the information documented in the  resident 's note and that I have also personally reperformed the history, physical exam and all medical decision making activities.  I have verified that all services and findings are accurately documented in this student's note; and I agree with management and plan as outlined in the documentation. I have also made any necessary editorial changes.   Laury Deep, [place] [facility] [employer], [employer] 09/04/2022 6:50 PM

## 2022-09-04 NOTE — Discharge Instructions (Signed)
2/3-1-1 Rule Go to MAU for painful contractions every 2-3 minutes, lasting 1 minute each for 1.5 hours.  

## 2022-09-04 NOTE — MAU Note (Signed)
...  Deborah Juarez is a 31 y.o. at [redacted]w[redacted]d here in MAU reporting: CTX since 0500 this morning as well as light vaginal bleeding that began around 0600 this morning. She reports at 0600 there was a "streak" of blood in the toilet and when she wiped she noted light pink blood. She reports it occurred again around noon. She reports her CTX are now 5-7 minutes apart. DFM since 0500 this morning. She reports she is still meeting her 5 movements an hour but reports it is still less. She also reports around 0500 this morning she had one trickle of fluids and none since.  Onset of complaint: 0500 this morning Pain score: 3/10 lower abdomen  FHT: 140 initial external Lab orders placed from triage: MAU Labor Eval

## 2022-09-05 ENCOUNTER — Encounter (HOSPITAL_COMMUNITY): Payer: Self-pay | Admitting: Physical Medicine and Rehabilitation

## 2022-09-05 ENCOUNTER — Inpatient Hospital Stay (HOSPITAL_COMMUNITY): Payer: No Typology Code available for payment source | Admitting: Anesthesiology

## 2022-09-05 ENCOUNTER — Inpatient Hospital Stay (HOSPITAL_COMMUNITY)
Admission: AD | Admit: 2022-09-05 | Discharge: 2022-09-06 | DRG: 807 | Disposition: A | Discharge status: Home / Self Care | Payer: No Typology Code available for payment source | Attending: Obstetrics and Gynecology | Admitting: Obstetrics and Gynecology

## 2022-09-05 DIAGNOSIS — O134 Gestational [pregnancy-induced] hypertension without significant proteinuria, complicating childbirth: Secondary | ICD-10-CM | POA: Diagnosis present

## 2022-09-05 DIAGNOSIS — Z3A39 39 weeks gestation of pregnancy: Secondary | ICD-10-CM

## 2022-09-05 DIAGNOSIS — R030 Elevated blood-pressure reading, without diagnosis of hypertension: Secondary | ICD-10-CM

## 2022-09-05 DIAGNOSIS — O26893 Other specified pregnancy related conditions, third trimester: Secondary | ICD-10-CM | POA: Diagnosis present

## 2022-09-05 DIAGNOSIS — Z98891 History of uterine scar from previous surgery: Secondary | ICD-10-CM | POA: Diagnosis not present

## 2022-09-05 DIAGNOSIS — O34219 Maternal care for unspecified type scar from previous cesarean delivery: Principal | ICD-10-CM | POA: Diagnosis present

## 2022-09-05 LAB — COMPREHENSIVE METABOLIC PANEL
ALT: 10 U/L (ref 0–44)
AST: 20 U/L (ref 15–41)
Albumin: 3 g/dL — ABNORMAL LOW (ref 3.5–5.0)
Alkaline Phosphatase: 283 U/L — ABNORMAL HIGH (ref 38–126)
Anion gap: 16 — ABNORMAL HIGH (ref 5–15)
BUN: 8 mg/dL (ref 6–20)
CO2: 19 mmol/L — ABNORMAL LOW (ref 22–32)
Calcium: 9.1 mg/dL (ref 8.9–10.3)
Chloride: 100 mmol/L (ref 98–111)
Creatinine, Ser: 0.74 mg/dL (ref 0.44–1.00)
GFR, Estimated: >60 mL/min (ref >60)
Glucose, Bld: 88 mg/dL (ref 70–99)
Potassium: 4.5 mmol/L (ref 3.5–5.1)
Sodium: 135 mmol/L (ref 135–145)
Total Bilirubin: 0.3 mg/dL (ref 0.3–1.2)
Total Protein: 7 g/dL (ref 6.5–8.1)

## 2022-09-05 LAB — CBC
HCT: 41.6 % (ref 36.0–46.0)
Hemoglobin: 13.6 g/dL (ref 12.0–15.0)
MCH: 29.4 pg (ref 26.0–34.0)
MCHC: 32.7 g/dL (ref 30.0–36.0)
MCV: 90 fL (ref 80.0–100.0)
Platelets: 219 10*3/uL (ref 150–400)
RBC: 4.62 MIL/uL (ref 3.87–5.11)
RDW: 12.5 % (ref 11.5–15.5)
WBC: 9.6 10*3/uL (ref 4.0–10.5)
nRBC: 0 % (ref 0.0–0.2)

## 2022-09-05 LAB — PROTEIN / CREATININE RATIO, URINE
Creatinine, Urine: 167 mg/dL
Protein Creatinine Ratio: 0.11 mg/mg{Cre} (ref 0.00–0.15)
Total Protein, Urine: 18 mg/dL

## 2022-09-05 LAB — TYPE AND SCREEN
ABO/RH(D): B POS
Antibody Screen: NEGATIVE

## 2022-09-05 MED ORDER — BUTORPHANOL TARTRATE 2 MG/ML IJ SOLN: 1.0000 mg | Freq: Once | INTRAMUSCULAR | Status: DC

## 2022-09-05 MED ORDER — LIDOCAINE-EPINEPHRINE (PF) 1.5 %-1:200000 IJ SOLN
INTRAMUSCULAR | Status: DC | PRN
  Administered 2022-09-05: 3 mL via EPIDURAL
  Administered 2022-09-05: 2 mL via EPIDURAL

## 2022-09-05 MED ORDER — ACETAMINOPHEN 325 MG PO TABS
650.0000 mg | ORAL_TABLET | ORAL | Status: DC | PRN
  Administered 2022-09-06: 650 mg via ORAL
  Filled 2022-09-05: qty 2

## 2022-09-05 MED ORDER — FLEET ENEMA 7-19 GM/118ML RE ENEM: 1.0000 | ENEMA | RECTAL | Status: DC | PRN

## 2022-09-05 MED ORDER — COCONUT OIL OIL: 1.0000 | TOPICAL_OIL | Status: DC | PRN

## 2022-09-05 MED ORDER — ACETAMINOPHEN 325 MG PO TABS
650.0000 mg | ORAL_TABLET | ORAL | Status: DC | PRN
  Administered 2022-09-05: 650 mg via ORAL
  Filled 2022-09-05: qty 2

## 2022-09-05 MED ORDER — SENNOSIDES-DOCUSATE SODIUM 8.6-50 MG PO TABS
2.0000 | ORAL_TABLET | Freq: Every day | ORAL | Status: DC
  Administered 2022-09-06: 2 via ORAL
  Filled 2022-09-05: qty 2

## 2022-09-05 MED ORDER — WITCH HAZEL-GLYCERIN EX PADS: 1.0000 | MEDICATED_PAD | CUTANEOUS | Status: DC | PRN

## 2022-09-05 MED ORDER — METHYLERGONOVINE MALEATE 0.2 MG/ML IJ SOLN: 0.2000 mg | INTRAMUSCULAR | Status: DC | PRN

## 2022-09-05 MED ORDER — FENTANYL CITRATE (PF) 100 MCG/2ML IJ SOLN: 50.0000 ug | INTRAMUSCULAR | Status: DC | PRN

## 2022-09-05 MED ORDER — ONDANSETRON HCL 4 MG/2ML IJ SOLN: 4.0000 mg | INTRAMUSCULAR | Status: DC | PRN

## 2022-09-05 MED ORDER — PHENYLEPHRINE 80 MCG/ML (10ML) SYRINGE FOR IV PUSH (FOR BLOOD PRESSURE SUPPORT): 80.0000 ug | PREFILLED_SYRINGE | INTRAVENOUS | Status: DC | PRN

## 2022-09-05 MED ORDER — NIFEDIPINE ER OSMOTIC RELEASE 30 MG PO TB24
30.0000 mg | ORAL_TABLET | Freq: Every day | ORAL | Status: DC
  Administered 2022-09-05 – 2022-09-06 (×2): 30 mg via ORAL
  Filled 2022-09-05 (×2): qty 1

## 2022-09-05 MED ORDER — LIDOCAINE HCL (PF) 1 % IJ SOLN: 30.0000 mL | INTRAMUSCULAR | Status: DC | PRN

## 2022-09-05 MED ORDER — ONDANSETRON 4 MG PO TBDP: 4.0000 mg | ORAL_TABLET | Freq: Four times a day (QID) | ORAL | Status: DC | PRN

## 2022-09-05 MED ORDER — SIMETHICONE 80 MG PO CHEW: 80.0000 mg | CHEWABLE_TABLET | ORAL | Status: DC | PRN

## 2022-09-05 MED ORDER — OXYTOCIN BOLUS FROM INFUSION
333.0000 mL | Freq: Once | INTRAVENOUS | Status: AC
  Administered 2022-09-05: 333 mL via INTRAVENOUS

## 2022-09-05 MED ORDER — METHYLERGONOVINE MALEATE 0.2 MG PO TABS: 0.2000 mg | ORAL_TABLET | ORAL | Status: DC | PRN

## 2022-09-05 MED ORDER — PANTOPRAZOLE SODIUM 40 MG PO TBEC
40.0000 mg | DELAYED_RELEASE_TABLET | Freq: Every day | ORAL | Status: DC
  Administered 2022-09-05 – 2022-09-06 (×2): 40 mg via ORAL
  Filled 2022-09-05 (×2): qty 1

## 2022-09-05 MED ORDER — SOD CITRATE-CITRIC ACID 500-334 MG/5ML PO SOLN: 30.0000 mL | ORAL | Status: DC | PRN

## 2022-09-05 MED ORDER — DIPHENHYDRAMINE HCL 50 MG/ML IJ SOLN: 12.5000 mg | INTRAMUSCULAR | Status: DC | PRN

## 2022-09-05 MED ORDER — BENZOCAINE-MENTHOL 20-0.5 % EX AERO
1.0000 | INHALATION_SPRAY | CUTANEOUS | Status: DC | PRN
  Administered 2022-09-05: 1 via TOPICAL
  Filled 2022-09-05: qty 56

## 2022-09-05 MED ORDER — LACTATED RINGERS IV SOLN
500.0000 mL | Freq: Once | INTRAVENOUS | Status: AC
  Administered 2022-09-05: 500 mL via INTRAVENOUS

## 2022-09-05 MED ORDER — DIBUCAINE (PERIANAL) 1 % EX OINT: 1.0000 | TOPICAL_OINTMENT | CUTANEOUS | Status: DC | PRN

## 2022-09-05 MED ORDER — FENTANYL-BUPIVACAINE-NACL 0.5-0.125-0.9 MG/250ML-% EP SOLN
12.0000 mL/h | EPIDURAL | Status: DC | PRN
  Administered 2022-09-05: 12 mL/h via EPIDURAL
  Filled 2022-09-05: qty 250

## 2022-09-05 MED ORDER — LACTATED RINGERS IV SOLN: 500.0000 mL | INTRAVENOUS | Status: DC | PRN

## 2022-09-05 MED ORDER — ZOLPIDEM TARTRATE 5 MG PO TABS: 5.0000 mg | ORAL_TABLET | Freq: Every evening | ORAL | Status: DC | PRN

## 2022-09-05 MED ORDER — OXYTOCIN-SODIUM CHLORIDE 30-0.9 UT/500ML-% IV SOLN
2.5000 [IU]/h | INTRAVENOUS | Status: DC
  Administered 2022-09-05: 2.5 [IU]/h via INTRAVENOUS
  Filled 2022-09-05: qty 500

## 2022-09-05 MED ORDER — ONDANSETRON HCL 4 MG PO TABS: 4.0000 mg | ORAL_TABLET | ORAL | Status: DC | PRN

## 2022-09-05 MED ORDER — DIPHENHYDRAMINE HCL 25 MG PO CAPS: 25.0000 mg | ORAL_CAPSULE | Freq: Four times a day (QID) | ORAL | Status: DC | PRN

## 2022-09-05 MED ORDER — LACTATED RINGERS IV SOLN: INTRAVENOUS | Status: DC

## 2022-09-05 MED ORDER — EPHEDRINE 5 MG/ML INJ: 10.0000 mg | INTRAVENOUS | Status: DC | PRN

## 2022-09-05 MED ORDER — PRENATAL MULTIVITAMIN CH
1.0000 | ORAL_TABLET | Freq: Every day | ORAL | Status: DC
  Filled 2022-09-05: qty 1

## 2022-09-05 MED ORDER — FENTANYL CITRATE (PF) 100 MCG/2ML IJ SOLN
50.0000 ug | Freq: Once | INTRAMUSCULAR | Status: AC
  Administered 2022-09-05: 50 ug via INTRAMUSCULAR
  Filled 2022-09-05: qty 2

## 2022-09-05 MED ORDER — OXYCODONE-ACETAMINOPHEN 5-325 MG PO TABS: 2.0000 | ORAL_TABLET | ORAL | Status: DC | PRN

## 2022-09-05 MED ORDER — FERROUS SULFATE 325 (65 FE) MG PO TABS
325.0000 mg | ORAL_TABLET | Freq: Two times a day (BID) | ORAL | Status: DC
  Administered 2022-09-06: 325 mg via ORAL
  Filled 2022-09-05: qty 1

## 2022-09-05 MED ORDER — IBUPROFEN 600 MG PO TABS
600.0000 mg | ORAL_TABLET | Freq: Four times a day (QID) | ORAL | Status: DC
  Administered 2022-09-05 – 2022-09-06 (×4): 600 mg via ORAL
  Filled 2022-09-05 (×4): qty 1

## 2022-09-05 MED ORDER — OXYCODONE-ACETAMINOPHEN 5-325 MG PO TABS: 1.0000 | ORAL_TABLET | ORAL | Status: DC | PRN

## 2022-09-05 MED ORDER — ONDANSETRON HCL 4 MG/2ML IJ SOLN
4.0000 mg | Freq: Four times a day (QID) | INTRAMUSCULAR | Status: DC | PRN
  Administered 2022-09-05: 4 mg via INTRAVENOUS
  Filled 2022-09-05: qty 2

## 2022-09-05 NOTE — MAU Provider Note (Addendum)
History     [id number]  Arrival date and time: 09/05/22 0408    Chief Complaint  Patient presents with   Contractions     HPI Deborah Juarez is a 31 y.o. at [redacted]w[redacted]d who presents for contractions. This is her second visit for contractions in 24 hours. Reports painful contractions every 3-5 minutes. Rates pain 10/10. Has continued to see some bloody mucoid discharge. No leaking of fluid. Reports good fetal movement.    OB History     Gravida  3   Para  1   Term      Preterm  1   AB  1   Living  1      SAB      IAB      Ectopic  1   Multiple  0   Live Births  1           Past Medical History:  Diagnosis Date   Anxiety    Asthma    GERD (gastroesophageal reflux disease)     Past Surgical History:  Procedure Laterality Date   BREAST SURGERY     CESAREAN SECTION N/A 03/03/2015   Procedure: CESAREAN SECTION;  Surgeon: Janyth Pupa, DO;  Location: [facility];  Service: Obstetrics;  Laterality: N/A;    Family History  Problem Relation Age of Onset   Cancer Father        Pacreatic Ca   Heart disease Father    Heart disease Maternal Grandfather    Heart disease Paternal Grandfather    Stroke Paternal Grandfather     No Known Allergies  No current facility-administered medications on file prior to encounter.   Current Outpatient Medications on File Prior to Encounter  Medication Sig Dispense Refill   Prenatal Vit-Fe Fumarate-FA (PRENATAL VITAMIN PO) Take 1 tablet by mouth daily.     acetaminophen (TYLENOL) 500 MG tablet Take 500 mg by mouth every 6 (six) hours as needed for headache.     ferrous sulfate 325 (65 FE) MG EC tablet Take 1 tablet (325 mg total) by mouth every other day. 45 tablet 1   ondansetron (ZOFRAN-ODT) 4 MG disintegrating tablet Take 1-2 tablets (4-8 mg total) by mouth every 6 (six) hours as needed for nausea or vomiting. 20 tablet 0   pseudoephedrine (SUDAFED) 30 MG tablet Take 2 tablets (60 mg total) by mouth every 6 (six) hours as  needed for congestion. 30 tablet 0     ROS Pertinent positives and negative per HPI, all others reviewed and negative  Physical Exam   BP 119/69   Pulse (!) 104   Temp 98.3 F (36.8 C) (Oral)   Resp 20   Ht 5\' 7"  (1.702 m)   Wt 69.2 kg   SpO2 98%   BMI 23.88 kg/m   Patient Vitals for the past 24 hrs:  BP Temp Temp src Pulse Resp SpO2 Height Weight  09/05/22 0700 119/69 -- -- (!) 104 -- 98 % -- --  09/05/22 0645 115/72 -- -- 70 -- 98 % -- --  09/05/22 0630 -- -- -- -- -- 100 % -- --  09/05/22 0615 (!) 137/97 -- -- 76 -- 99 % -- --  09/05/22 0610 -- -- -- -- -- 99 % -- --  09/05/22 0605 -- -- -- -- -- 99 % -- --  09/05/22 0600 129/88 -- -- 85 -- 99 % -- --  09/05/22 0550 -- -- -- -- -- 100 % -- --  09/05/22 0545 115/82 -- --  76 -- 99 % -- --  09/05/22 0540 -- -- -- -- -- 98 % -- --  09/05/22 0535 -- -- -- -- -- 98 % -- --  09/05/22 0530 (!) 130/91 -- -- 69 -- 98 % -- --  09/05/22 0525 -- -- -- -- -- 98 % -- --  09/05/22 0520 -- -- -- -- -- 97 % -- --  09/05/22 0515 118/85 -- -- 83 -- -- -- --  09/05/22 0510 -- -- -- -- -- 97 % -- --  09/05/22 0500 117/84 -- -- 73 -- 99 % -- --  09/05/22 0455 -- -- -- -- -- 98 % -- --  09/05/22 0450 133/86 -- -- 90 -- 99 % -- --  09/05/22 0445 -- -- -- -- -- 99 % -- --  09/05/22 0440 -- -- -- -- -- 100 % -- --  09/05/22 0437 (!) 132/96 98.3 F (36.8 C) Oral 92 20 100 % 5\' 7"  (1.702 m) 69.2 kg  09/05/22 0435 (!) 132/96 -- -- 96 -- 100 % -- --    Physical Exam Vitals and nursing note reviewed. Exam conducted with a chaperone present.  Constitutional:      General: She is in acute distress.     Appearance: Normal appearance.  HENT:     Head: Normocephalic and atraumatic.  Eyes:     General: No scleral icterus.    Conjunctiva/sclera: Conjunctivae normal.  Pulmonary:     Effort: Pulmonary effort is normal. No respiratory distress.  Skin:    General: Skin is warm and dry.  Neurological:     Mental Status: She is alert.       Cervical Exam Dilation: 2.5 Effacement (%): 70 Cervical Position: Posterior Station: -3 Presentation: Vertex Exam by:: Jorje Guild, NP   FHT Baseline 120, moderate variability, 15x15 accels, no decels Toco: Q 2-7 minutes Cat: 1  Labs Results for orders placed or performed during the hospital encounter of 09/04/22 (from the past 24 hour(s))  Fern Test     Status: None   Collection Time: 09/04/22  3:11 PM  Result Value Ref Range   POCT Fern Test Negative = intact amniotic membranes   CBC     Status: Abnormal   Collection Time: 09/04/22  3:39 PM  Result Value Ref Range   WBC 8.4 4.0 - 10.5 K/uL   RBC 3.87 3.87 - 5.11 MIL/uL   Hemoglobin 11.0 (L) 12.0 - 15.0 g/dL   HCT 33.1 (L) 36.0 - 46.0 %   MCV 85.5 80.0 - 100.0 fL   MCH 28.4 26.0 - 34.0 pg   MCHC 33.2 30.0 - 36.0 g/dL   RDW 12.4 11.5 - 15.5 %   Platelets 219 150 - 400 K/uL   nRBC 0.0 0.0 - 0.2 %  Comprehensive metabolic panel     Status: Abnormal   Collection Time: 09/04/22  3:39 PM  Result Value Ref Range   Sodium 134 (L) 135 - 145 mmol/L   Potassium 3.3 (L) 3.5 - 5.1 mmol/L   Chloride 104 98 - 111 mmol/L   CO2 25 22 - 32 mmol/L   Glucose, Bld 95 70 - 99 mg/dL   BUN 5 (L) 6 - 20 mg/dL   Creatinine, Ser 0.75 0.44 - 1.00 mg/dL   Calcium 8.6 (L) 8.9 - 10.3 mg/dL   Total Protein 6.6 6.5 - 8.1 g/dL   Albumin 2.8 (L) 3.5 - 5.0 g/dL   AST 16 15 - 41 U/L   ALT  9 0 - 44 U/L   Alkaline Phosphatase 244 (H) 38 - 126 U/L   Total Bilirubin 0.5 0.3 - 1.2 mg/dL   GFR, Estimated >60 >60 mL/min   Anion gap 5 5 - 15    Imaging No results found.  MAU Course  Procedures Lab Orders  No laboratory test(s) ordered today   Meds ordered this encounter  Medications   DISCONTD: butorphanol (STADOL) injection 1 mg   fentaNYL (SUBLIMAZE) injection 50 mcg   Imaging Orders  No imaging studies ordered today    MDM Patient not coping well with regular painful contractions. Minimal improvement after IM fentanyl given. Has  made some cervical change & has bloody show.  Abdomen soft between contractions.   Intermittent mildly elevated DBPs that occur when cuff goes off during a contraction. Between contractions she is normotensive. Denies history of hypertension. PEC labs deferred.   Dr. Landry Mellow notified of patient's exam. Will admit to birthing suites. I will place orders as Dr. Landry Mellow is going into surgery.  Assessment and Plan   1. Indication for care in labor and delivery, antepartum  -Admit to birthing suites for early labor -GBS negative per prenatal record  2. Elevated BP without diagnosis of hypertension  -Likely pain related during contractions. Will get preeclampsia labs with admission labs.   3. Previous cesarean section  -First delivery c/section due to breech. Wants to [facility].   4. [redacted] weeks gestation of pregnancy      Jorje Guild, NP 09/05/22 7:10 AM

## 2022-09-05 NOTE — Lactation Note (Signed)
This note was copied from a baby's chart. Lactation Consultation Note  Patient Name: Deborah Juarez [id number] Date: 09/05/2022 Reason for consult: Initial assessment;Term Age:33 hours Mom stated BF going well. No trouble latching. While talking baby started cueing. LC watched mom latch baby w/ease. Mom denies painful latch. LC reviewed newborn feeding habits, behavior, STS, I&O, and support. Mom encouraged to feed baby 8-12 times/24 hours and with feeding cues.   Mom doesn't have any questions at this time. Encouraged mom if needs assistance to call.   Maternal Data Does the patient have breastfeeding experience prior to this delivery?: Yes How long did the patient breastfeed?: 9 months to her 31 yr old  Feeding    LATCH Score Latch: Grasps breast easily, tongue down, lips flanged, rhythmical sucking.  Audible Swallowing: A few with stimulation  Type of Nipple: Everted at rest and after stimulation  Comfort (Breast/Nipple): Soft / non-tender  Hold (Positioning): No assistance needed to correctly position infant at breast.  LATCH Score: 9   Lactation Tools Discussed/Used    Interventions Interventions: Breast feeding basics reviewed;Support pillows;Skin to skin;Position options;LC Services brochure  Discharge    Consult Status Consult Status: Follow-up Date: 09/06/22 Follow-up type: In-patient    Theodoro Kalata 09/05/2022, 8:56 PM

## 2022-09-05 NOTE — H&P (Signed)
Deborah Juarez is a 31 y.o. female presenting for contractions every 3-4 minutes. She was seen in MAU yesterday and cervix was 1.5 cm. She presented again today with more painful regular contractions cervix 2.5 cm. Pregnancy is complicated by h/o cesarean section for breech presentation. ( Pt desires TOLAC).   Prenatal care provided by Dr. Gerald Leitz with [facility]. OB History     Gravida  3   Para  1   Term      Preterm  1   AB  1   Living  1      SAB      IAB      Ectopic  1   Multiple  0   Live Births  1          Past Medical History:  Diagnosis Date   Anxiety    Asthma    "long " time since used rescue inhaler   GERD (gastroesophageal reflux disease)    Past Surgical History:  Procedure Laterality Date   BREAST SURGERY     CESAREAN SECTION N/A 03/03/2015   Procedure: CESAREAN SECTION;  Surgeon: Myna Hidalgo, DO;  Location: [facility];  Service: Obstetrics;  Laterality: N/A;   Family History: family history includes Cancer in her father; Heart disease in her father, maternal grandfather, and paternal grandfather; Stroke in her paternal grandfather. Social History:  reports that she has never smoked. She has never used smokeless tobacco. She reports that she does not currently use alcohol after a past usage of about 3.0 standard drinks of alcohol per week. She reports that she does not use drugs.     Maternal Diabetes: No Genetic Screening: Declined Maternal Ultrasounds/Referrals: Normal Fetal Ultrasounds or other Referrals:  None Maternal Substance Abuse:  No Significant Maternal Medications:  None Significant Maternal Lab Results:  Group B Strep negative Number of Prenatal Visits:greater than 3 verified prenatal visits Other Comments:  None  Review of Systems  Constitutional: Negative.   HENT: Negative.    Eyes: Negative.   Respiratory: Negative.    Cardiovascular: Negative.   Gastrointestinal: Negative.   Endocrine: Negative.   Genitourinary:  Negative.   Musculoskeletal: Negative.   Skin: Negative.   Allergic/Immunologic: Negative.   Neurological: Negative.   Hematological: Negative.   Psychiatric/Behavioral: Negative.     History Dilation: 6 Effacement (%): 90 Station: -2, -1 Exam by:: Dyke Brackett, RN Blood pressure 125/85, pulse 91, temperature 98.3 F (36.8 C), temperature source Oral, resp. rate 20, height 5\' 7"  (1.702 m), weight 69.2 kg, SpO2 98 %, unknown if currently breastfeeding. Maternal Exam:  Introitus: Normal vulva.   Physical Exam Vitals reviewed.  Constitutional:      Appearance: Normal appearance.  HENT:     Head: Normocephalic and atraumatic.  Cardiovascular:     Rate and Rhythm: Normal rate and regular rhythm.  Pulmonary:     Effort: Pulmonary effort is normal.     Breath sounds: Normal breath sounds.  Abdominal:     Tenderness: There is no abdominal tenderness.  Genitourinary:    General: Normal vulva.  Musculoskeletal:        General: No swelling. Normal range of motion.     Cervical back: Normal range of motion and neck supple.  Skin:    General: Skin is warm and dry.  Neurological:     General: No focal deficit present.     Mental Status: She is alert and oriented to person, place, and time.  Psychiatric:  Mood and Affect: Mood normal.        Behavior: Behavior normal.    Cervix 10/100/Plus 2 station  Prenatal labs: ABO, Rh: --/--/B POS (10/17 0729) Antibody: NEG (10/17 0729) Rubella: Immune (05/18 0000) RPR: Nonreactive (05/18 0000)  HBsAg: Negative (05/18 0000)  HIV: Non-reactive (05/18 0000)  GBS: Negative/-- (09/28 0000)   Assessment/Plan: 39 weeks and 2 days in  labor  Admit to Labor and delivery  H/o cesarean section pt desires TOLAC r/b/a discussed with the patient. Including but not limited to 1 % r/o uterine rupture with increased morbidity and mortality if rupture occurs .  She signed a tolac consent form in the office.  -Epidural for pain control  -  anticipate SVD   Christophe Louis 09/05/2022, 10:16 AM

## 2022-09-05 NOTE — Lactation Note (Signed)
This note was copied from a baby's chart. Lactation Consultation Note  Patient Name: Girl Alli Jasmer [id number] Date: 09/05/2022 Reason for consult: L&D Initial assessment;Term Age:33 hours   Initial L&D Consult:  Visited with family < 1 hour after birth Assisted to latch "Charleigh" easily to the left breast.  She eagerly began sucking; birth parent felt a good latch.  Reassured parents that lactation will follow up on the M/B unit.  Support person and one other visitor present.   Maternal Data    Feeding Mother's Current Feeding Choice: Breast Milk  LATCH Score Latch: Grasps breast easily, tongue down, lips flanged, rhythmical sucking.  Audible Swallowing: None  Type of Nipple: Everted at rest and after stimulation  Comfort (Breast/Nipple): Soft / non-tender  Hold (Positioning): Assistance needed to correctly position infant at breast and maintain latch.  LATCH Score: 7   Lactation Tools Discussed/Used    Interventions Interventions: Assisted with latch;Skin to skin  Discharge    Consult Status Consult Status: Follow-up from L&D    Damaree Sargent R Shontavia Mickel 09/05/2022, 1:56 PM

## 2022-09-05 NOTE — Anesthesia Preprocedure Evaluation (Signed)
Anesthesia Evaluation  Patient identified by MRN, date of birth, ID band Patient awake    Reviewed: Allergy & Precautions, Patient's Chart, lab work & pertinent test results  Airway Mallampati: II  TM Distance: >3 FB Neck ROM: Full    Dental no notable dental hx. (+) Teeth Intact   Pulmonary asthma ,    Pulmonary exam normal breath sounds clear to auscultation       Cardiovascular negative cardio ROS Normal cardiovascular exam Rhythm:Regular Rate:Normal     Neuro/Psych Anxiety negative neurological ROS     GI/Hepatic Neg liver ROS, GERD  Medicated and Controlled,  Endo/Other  negative endocrine ROS  Renal/GU negative Renal ROS     Musculoskeletal negative musculoskeletal ROS (+)   Abdominal Normal abdominal exam  (+) - obese,   Peds  Hematology negative hematology ROS (+)   Anesthesia Other Findings   Reproductive/Obstetrics (+) Pregnancy Breech presentation 37 weeks in labor                             Anesthesia Physical  Anesthesia Plan  ASA: 2  Anesthesia Plan: Epidural   Post-op Pain Management:    Induction:   PONV Risk Score and Plan:   Airway Management Planned:   Additional Equipment:   Intra-op Plan:   Post-operative Plan:   Informed Consent: I have reviewed the patients History and Physical, chart, labs and discussed the procedure including the risks, benefits and alternatives for the proposed anesthesia with the patient or authorized representative who has indicated his/her understanding and acceptance.       Plan Discussed with:   Anesthesia Plan Comments:         Anesthesia Quick Evaluation

## 2022-09-05 NOTE — MAU Note (Signed)
.  Deborah Juarez is a 31 y.o. at [redacted]w[redacted]d here in MAU reporting: return to MAU for labor recheck with c/o CTX every 4 mins since 2200 last night. Pt report bloody show since yesterday. Pt denies VB, LOF, DFM, PIH s/s, recent intercourse, and complications in the pregnancy. SVE 1 cm yesterday am GBS neg Onset of complaint: 2200 Pain score: 9/10 Vitals:   09/05/22 0437  BP: (!) 132/96  Pulse: 92  Resp: 20  Temp: 98.3 F (36.8 C)  SpO2: 100%     FHT:120 Lab orders placed from triage:

## 2022-09-05 NOTE — Anesthesia Procedure Notes (Signed)
Epidural Patient location during procedure: OB Start time: 09/05/2022 8:39 AM End time: 09/05/2022 9:01 AM  Staffing Anesthesiologist: Nolon Nations, MD Performed: anesthesiologist   Preanesthetic Checklist Completed: patient identified, IV checked, risks and benefits discussed, monitors and equipment checked, pre-op evaluation and timeout performed  Epidural Patient position: sitting Prep: DuraPrep and site prepped and draped Patient monitoring: heart rate, continuous pulse ox and blood pressure Approach: midline Location: L2-L3 Injection technique: LOR air and LOR saline  Needle:  Needle type: Tuohy  Needle gauge: 17 G Needle length: 9 cm Needle insertion depth: 5 cm Catheter type: closed end flexible Catheter size: 19 Gauge Catheter at skin depth: 10 cm Test dose: negative  Assessment Sensory level: T8 Events: blood not aspirated, injection not painful, no injection resistance, paresthesia and negative IV test  Additional Notes R leg paresthesia with LOR to saline. Needle redirected left and LOR reobtained without paresthesia. Catheter threaded without issue or paresthesia. - rbcs/csf with catheter aspiration.Reason for block:procedure for pain

## 2022-09-06 LAB — CBC
HCT: 29.4 % — ABNORMAL LOW (ref 36.0–46.0)
Hemoglobin: 9.9 g/dL — ABNORMAL LOW (ref 12.0–15.0)
MCH: 28.9 pg (ref 26.0–34.0)
MCHC: 33.7 g/dL (ref 30.0–36.0)
MCV: 85.7 fL (ref 80.0–100.0)
Platelets: 193 10*3/uL (ref 150–400)
RBC: 3.43 MIL/uL — ABNORMAL LOW (ref 3.87–5.11)
RDW: 12.4 % (ref 11.5–15.5)
WBC: 14.6 10*3/uL — ABNORMAL HIGH (ref 4.0–10.5)
nRBC: 0 % (ref 0.0–0.2)

## 2022-09-06 LAB — RPR: RPR Ser Ql: NONREACTIVE

## 2022-09-06 MED ORDER — NIFEDIPINE ER 30 MG PO TB24: 30.0000 mg | ORAL_TABLET | Freq: Every day | ORAL | 1 refills | Status: DC

## 2022-09-06 MED ORDER — ACETAMINOPHEN 325 MG PO TABS: 650.0000 mg | ORAL_TABLET | ORAL | Status: DC | PRN

## 2022-09-06 MED ORDER — PANTOPRAZOLE SODIUM 40 MG PO TBEC: 40.0000 mg | DELAYED_RELEASE_TABLET | Freq: Every day | ORAL | 0 refills | Status: DC

## 2022-09-06 MED ORDER — IBUPROFEN 600 MG PO TABS: 600.0000 mg | ORAL_TABLET | Freq: Four times a day (QID) | ORAL | 0 refills | Status: DC | PRN

## 2022-09-06 MED ORDER — FERROUS SULFATE 325 (65 FE) MG PO TABS: 325.0000 mg | ORAL_TABLET | Freq: Every day | ORAL | 1 refills | Status: DC

## 2022-09-06 NOTE — Progress Notes (Signed)
CSW received consult for hx of Anxiety.  CSW met with MOB to offer support and complete assessment, FOB present. CSW introduced self, MOB granted CSW verbal permission to speak in front of FOB about anything. CSW explained reason for consult. MOB was welcoming, pleasant, and remained engaged during assessment. CSW and MOB discussed MOB's mental health history. MOB reported that she was diagnosed with anxiety and depression around age 31. MOB denied any current symptoms and reported that she is not currently participating in therapy or taking any medication to treat mental health diagnoses. MOB shared that this pregnancy has been great. MOB endorsed experiencing postpartum depression after her having her last child. MOB reported that her postpartum depression started about one year later and lasted about six months. MOB described her postpartum depression as feeling out of her body and just going through the motions. MOB attributed her postpartum depression to adjusting to motherhood and going back to work too soon. MOB reported that she used holistic care to cope with PPD and shared that the symptoms subsided on their own. CSW inquired about how MOB was feeling emotionally since giving birth, MOB reported that she was feeling good. MOB presented calm and appeared happy as evidenced by smiling throughout the assessment. MOB did not demonstrate any acute mental health signs/symptoms. CSW assessed for safety, MOB denied SI and HI. CSW did not assess for domestic violence as FOB was present. CSW inquired about MOB's support system, MOB reported that her Husband/FOB and mom are supports.   CSW provided education regarding the baby blues period vs. perinatal mood disorders, discussed treatment and gave resources for mental health follow up if concerns arise.  CSW recommends self-evaluation during the postpartum time period using the New Mom Checklist from Postpartum Progress and encouraged MOB to contact a medical  professional if symptoms are noted at any time.    CSW provided review of Sudden Infant Death Syndrome (SIDS) precautions. MOB verbalized understanding and reported having all items needed to care for infant including a car seat and basinet.   CSW identifies no further need for intervention and no barriers to discharge at this time.  Parag Dorton, LCSW Clinical Social Worker [facility] Cell#: [phone]  

## 2022-09-06 NOTE — Anesthesia Postprocedure Evaluation (Signed)
Anesthesia Post Note  Patient: Clinical cytogeneticist  Procedure(s) Performed: AN AD [place]     Patient location during evaluation: Mother Baby Anesthesia Type: Epidural Level of consciousness: awake, oriented and awake and alert Pain management: pain level controlled Respiratory status: spontaneous breathing, respiratory function stable and nonlabored ventilation Cardiovascular status: stable Postop Assessment: adequate PO intake, no headache, able to ambulate, patient able to bend at knees and no apparent nausea or vomiting Anesthetic complications: no   No notable events documented.  Last Vitals:  Vitals:   09/06/22 0124 09/06/22 0518  BP: 117/85 107/75  Pulse: 71 77  Resp: 18 18  Temp: 37.2 C 36.9 C  SpO2:      Last Pain:  Vitals:   09/06/22 0518  TempSrc: Oral  PainSc:    Pain Goal:                   Saurabh Hettich

## 2022-09-06 NOTE — Discharge Summary (Signed)
Postpartum Discharge Summary  Date of Service updated 09/06/2022     Patient Name: Deborah Juarez DOB: January 13, 1991 MRN: [id number]  Date of admission: 09/05/2022 Delivery date:09/05/2022  Delivering provider: Christophe Louis  Date of discharge: 09/06/2022  Admitting diagnosis: Indication for care in labor and delivery, antepartum [O75.9] Intrauterine pregnancy: [redacted]w[redacted]d    Secondary diagnosis:  Principal Problem:   Indication for care in labor and delivery, antepartum Active Problems:   Normal labor  Additional problems: None    Discharge diagnosis: Term Pregnancy Delivered and Gestational Hypertension                                              Post partum procedures: None Augmentation: N/A Complications: None  Hospital course: Onset of Labor With Vaginal Delivery      31y.o. yo [id number] 359w2d as admitted in Active Labor on 09/05/2022. Labor course was not complicated   Membrane Rupture Time/Date: 12:38 PM ,09/05/2022   Delivery Method:Vaginal, Spontaneous  Episiotomy: None  Lacerations:  2nd degree;Perineal  Patient had a postpartum course complicated by None.  She is ambulating, tolerating a regular diet, passing flatus, and urinating well. Patient is discharged home in stable condition on 09/06/22.  Newborn Data: Birth date:09/05/2022  Birth time:1:21 PM  Gender:Female  Living status:Living  Apgars:9 ,9  Weight:2900 g   Magnesium Sulfate received: No BMZ received: No Rhophylac:N/A MMR:N/A T-DaP:Given prenatally Flu: N/A Transfusion:No  Physical exam  Vitals:   09/06/22 0124 09/06/22 0518 09/06/22 1401 09/06/22 1633  BP: 117/85 107/75  124/83  Pulse: 71 77 92 (!) 102  Resp: _0 Temp: 99 F (37.2 C) 98.4 F (36.9 C) 98.3 F (36.8 C)   TempSrc: Oral Oral Oral   SpO2:      Weight:      Height:       General: alert, cooperative, and no distress Lochia: appropriate Uterine Fundus: firm Incision: N/A DVT Evaluation: No evidence of DVT seen on  physical exam. Labs: Lab Results  Component Value Date   WBC 14.6 (H) 09/06/2022   HGB 9.9 (L) 09/06/2022   HCT 29.4 (L) 09/06/2022   MCV 85.7 09/06/2022   PLT 193 09/06/2022      Latest Ref Rng & Units 09/05/2022    7:29 AM  CMP  Glucose 70 - 99 mg/dL 88   BUN 6 - 20 mg/dL 8   Creatinine 0.44 - 1.00 mg/dL 0.74   Sodium 135 - 145 mmol/L 135   Potassium 3.5 - 5.1 mmol/L 4.5   Chloride 98 - 111 mmol/L 100   CO2 22 - 32 mmol/L 19   Calcium 8.9 - 10.3 mg/dL 9.1   Total Protein 6.5 - 8.1 g/dL 7.0   Total Bilirubin 0.3 - 1.2 mg/dL 0.3   Alkaline Phos 38 - 126 U/L 283   AST 15 - 41 U/L 20   ALT 0 - 44 U/L 10    Edinburgh Score:     No data to display            After visit meds:  Allergies as of 09/06/2022   No Known Allergies      Medication List     STOP taking these medications    ferrous sulfate 325 (65 FE) MG EC tablet Replaced by: ferrous sulfate 325 (65 FE) MG tablet  TAKE these medications    acetaminophen 325 MG tablet Commonly known as: Tylenol Take 2 tablets (650 mg total) by mouth every 4 (four) hours as needed (for pain scale < 4).   ferrous sulfate 325 (65 FE) MG tablet Take 1 tablet (325 mg total) by mouth daily with breakfast. Replaces: ferrous sulfate 325 (65 FE) MG EC tablet   ibuprofen 600 MG tablet Commonly known as: ADVIL Take 1 tablet (600 mg total) by mouth every 6 (six) hours as needed.   NIFEdipine 30 MG 24 hr tablet Commonly known as: ADALAT CC Take 1 tablet (30 mg total) by mouth daily. Start taking on: September 07, 2022   ondansetron 4 MG disintegrating tablet Commonly known as: ZOFRAN-ODT Take 1-2 tablets (4-8 mg total) by mouth every 6 (six) hours as needed for nausea or vomiting.   ondansetron 8 MG tablet Commonly known as: ZOFRAN Take 8 mg by mouth every 8 (eight) hours as needed for nausea or vomiting.   pantoprazole 40 MG tablet Commonly known as: PROTONIX Take 1 tablet (40 mg total) by mouth  daily. Start taking on: September 07, 2022   PRENATAL VITAMIN PO Take 1 tablet by mouth daily.   pseudoephedrine 30 MG tablet Commonly known as: Sudafed Take 2 tablets (60 mg total) by mouth every 6 (six) hours as needed for congestion.         Discharge home in stable condition Infant Feeding: Breast Infant Disposition:home with mother Discharge instruction: per After Visit Summary and Postpartum booklet. Activity: Advance as tolerated. Pelvic rest for 6 weeks.  Diet: routine diet Anticipated Birth Control: Unsure Postpartum Appointment:1 week Additional Postpartum F/U: BP check 1 week Future Appointments:No future appointments. Follow up Visit:  Follow-up Information     Christophe Louis, MD. Schedule an appointment as soon as possible for a visit in 1 week(s).   Specialty: Obstetrics and Gynecology Why: please call to make and appt in 1 week with the nurse for a blood pressure check.. make an appt in 2 weeks for postpartum visit with Dr. Landry Mellow to rule out postpartum depression. Contact information: [address] [place] [address] [phone]                     09/06/2022 Christophe Louis, MD

## 2022-09-19 ENCOUNTER — Telehealth (HOSPITAL_COMMUNITY): Payer: Self-pay | Admitting: *Deleted

## 2022-09-19 NOTE — Telephone Encounter (Signed)
Attempted Hospital Discharge Follow-Up Call.  Left voice mail requesting that patient return RN's phone call if patient has any concerns or questions regarding herself or her baby.  

## 2022-12-18 DIAGNOSIS — F32A Depression, unspecified: Secondary | ICD-10-CM | POA: Diagnosis present

## 2022-12-25 ENCOUNTER — Observation Stay (HOSPITAL_COMMUNITY)
Admission: EM | Admit: 2022-12-25 | Discharge: 2022-12-27 | Disposition: A | Discharge status: Home / Self Care | Payer: Medicaid Other | Attending: Emergency Medicine | Admitting: Emergency Medicine

## 2022-12-25 ENCOUNTER — Other Ambulatory Visit: Payer: Self-pay

## 2022-12-25 DIAGNOSIS — J45909 Unspecified asthma, uncomplicated: Secondary | ICD-10-CM | POA: Diagnosis not present

## 2022-12-25 DIAGNOSIS — E860 Dehydration: Secondary | ICD-10-CM

## 2022-12-25 DIAGNOSIS — E872 Acidosis, unspecified: Secondary | ICD-10-CM | POA: Diagnosis present

## 2022-12-25 DIAGNOSIS — A09 Infectious gastroenteritis and colitis, unspecified: Secondary | ICD-10-CM | POA: Diagnosis not present

## 2022-12-25 DIAGNOSIS — E8721 Acute metabolic acidosis: Secondary | ICD-10-CM | POA: Diagnosis not present

## 2022-12-25 DIAGNOSIS — R001 Bradycardia, unspecified: Secondary | ICD-10-CM | POA: Diagnosis not present

## 2022-12-25 DIAGNOSIS — R10819 Abdominal tenderness, unspecified site: Secondary | ICD-10-CM | POA: Diagnosis present

## 2022-12-25 DIAGNOSIS — I4589 Other specified conduction disorders: Secondary | ICD-10-CM | POA: Insufficient documentation

## 2022-12-25 DIAGNOSIS — F32A Depression, unspecified: Secondary | ICD-10-CM | POA: Diagnosis present

## 2022-12-25 DIAGNOSIS — R7401 Elevation of levels of liver transaminase levels: Secondary | ICD-10-CM | POA: Diagnosis present

## 2022-12-25 DIAGNOSIS — K529 Noninfective gastroenteritis and colitis, unspecified: Secondary | ICD-10-CM | POA: Diagnosis present

## 2022-12-25 DIAGNOSIS — R9431 Abnormal electrocardiogram [ECG] [EKG]: Secondary | ICD-10-CM | POA: Diagnosis present

## 2022-12-25 DIAGNOSIS — R1110 Vomiting, unspecified: Secondary | ICD-10-CM

## 2022-12-25 DIAGNOSIS — F419 Anxiety disorder, unspecified: Secondary | ICD-10-CM | POA: Diagnosis present

## 2022-12-25 DIAGNOSIS — R002 Palpitations: Secondary | ICD-10-CM | POA: Diagnosis present

## 2022-12-25 DIAGNOSIS — E8729 Other acidosis: Secondary | ICD-10-CM | POA: Diagnosis present

## 2022-12-25 NOTE — ED Triage Notes (Signed)
Pt c/o generalized abdominal pain along with NVD and weakness x2 days. Pt states children sick with similar symptoms. 400 mL IVF and 4 IV zofran given by EMS. 18G LAC.

## 2022-12-26 ENCOUNTER — Encounter (HOSPITAL_COMMUNITY): Payer: Self-pay | Admitting: Internal Medicine

## 2022-12-26 DIAGNOSIS — E872 Acidosis, unspecified: Secondary | ICD-10-CM | POA: Diagnosis present

## 2022-12-26 DIAGNOSIS — E8729 Other acidosis: Secondary | ICD-10-CM | POA: Diagnosis present

## 2022-12-26 DIAGNOSIS — K529 Noninfective gastroenteritis and colitis, unspecified: Secondary | ICD-10-CM

## 2022-12-26 DIAGNOSIS — R7401 Elevation of levels of liver transaminase levels: Secondary | ICD-10-CM | POA: Diagnosis present

## 2022-12-26 DIAGNOSIS — R9431 Abnormal electrocardiogram [ECG] [EKG]: Secondary | ICD-10-CM | POA: Diagnosis present

## 2022-12-26 DIAGNOSIS — R002 Palpitations: Secondary | ICD-10-CM | POA: Diagnosis present

## 2022-12-26 LAB — COMPREHENSIVE METABOLIC PANEL
ALT: 74 U/L — ABNORMAL HIGH (ref 0–44)
ALT: 87 U/L — ABNORMAL HIGH (ref 0–44)
AST: 189 U/L — ABNORMAL HIGH (ref 15–41)
AST: 160 U/L — ABNORMAL HIGH (ref 15–41)
Albumin: 4.3 g/dL (ref 3.5–5.0)
Albumin: 4.1 g/dL (ref 3.5–5.0)
Alkaline Phosphatase: 63 U/L (ref 38–126)
Alkaline Phosphatase: 62 U/L (ref 38–126)
Anion gap: 27 — ABNORMAL HIGH (ref 5–15)
Anion gap: 20 — ABNORMAL HIGH (ref 5–15)
BUN: 10 mg/dL (ref 6–20)
BUN: 6 mg/dL (ref 6–20)
CO2: 13 mmol/L — ABNORMAL LOW (ref 22–32)
CO2: 16 mmol/L — ABNORMAL LOW (ref 22–32)
Calcium: 8.6 mg/dL — ABNORMAL LOW (ref 8.9–10.3)
Calcium: 8.3 mg/dL — ABNORMAL LOW (ref 8.9–10.3)
Chloride: 101 mmol/L (ref 98–111)
Chloride: 100 mmol/L (ref 98–111)
Creatinine, Ser: 0.84 mg/dL (ref 0.44–1.00)
Creatinine, Ser: 0.99 mg/dL (ref 0.44–1.00)
GFR, Estimated: >60 mL/min (ref >60)
GFR, Estimated: >60 mL/min (ref >60)
Glucose, Bld: 71 mg/dL (ref 70–99)
Glucose, Bld: 72 mg/dL (ref 70–99)
Potassium: 3.1 mmol/L — ABNORMAL LOW (ref 3.5–5.1)
Potassium: 4 mmol/L (ref 3.5–5.1)
Sodium: 141 mmol/L (ref 135–145)
Sodium: 136 mmol/L (ref 135–145)
Total Bilirubin: 0.9 mg/dL (ref 0.3–1.2)
Total Bilirubin: 1 mg/dL (ref 0.3–1.2)
Total Protein: 7.4 g/dL (ref 6.5–8.1)
Total Protein: 7.3 g/dL (ref 6.5–8.1)

## 2022-12-26 LAB — I-STAT BETA HCG BLOOD, ED (MC, WL, AP ONLY): I-stat hCG, quantitative: <5 m[IU]/mL (ref <5)

## 2022-12-26 LAB — URINALYSIS, ROUTINE W REFLEX MICROSCOPIC
Bacteria, UA: NONE SEEN
Bilirubin Urine: NEGATIVE
Glucose, UA: NEGATIVE mg/dL
Hgb urine dipstick: NEGATIVE
Ketones, ur: 80 mg/dL — AB
Leukocytes,Ua: NEGATIVE
Nitrite: NEGATIVE
Protein, ur: 30 mg/dL — AB
Specific Gravity, Urine: 1.019 (ref 1.005–1.030)
pH: 5 (ref 5.0–8.0)

## 2022-12-26 LAB — CBC
HCT: 37.4 % (ref 36.0–46.0)
Hemoglobin: 12.1 g/dL (ref 12.0–15.0)
MCH: 30.2 pg (ref 26.0–34.0)
MCHC: 32.4 g/dL (ref 30.0–36.0)
MCV: 93.3 fL (ref 80.0–100.0)
Platelets: 196 10*3/uL (ref 150–400)
RBC: 4.01 MIL/uL (ref 3.87–5.11)
RDW: 13.7 % (ref 11.5–15.5)
WBC: 5.6 10*3/uL (ref 4.0–10.5)
nRBC: 0 % (ref 0.0–0.2)

## 2022-12-26 LAB — LACTIC ACID, PLASMA
Lactic Acid, Venous: 4.1 mmol/L (ref 0.5–1.9)
Lactic Acid, Venous: 1.6 mmol/L (ref 0.5–1.9)

## 2022-12-26 LAB — LIPASE, BLOOD: Lipase: 48 U/L (ref 11–51)

## 2022-12-26 LAB — MAGNESIUM: Magnesium: 1.8 mg/dL (ref 1.7–2.4)

## 2022-12-26 MED ORDER — PROCHLORPERAZINE EDISYLATE 10 MG/2ML IJ SOLN: 10.0000 mg | Freq: Four times a day (QID) | INTRAMUSCULAR | Status: DC | PRN

## 2022-12-26 MED ORDER — LACTATED RINGERS IV SOLN: INTRAVENOUS | Status: DC

## 2022-12-26 MED ORDER — LORAZEPAM 2 MG/ML IJ SOLN
1.0000 mg | Freq: Once | INTRAMUSCULAR | Status: AC
  Administered 2022-12-26: 1 mg via INTRAVENOUS
  Filled 2022-12-26: qty 1

## 2022-12-26 MED ORDER — SODIUM CHLORIDE 0.9 % IV BOLUS
1000.0000 mL | Freq: Once | INTRAVENOUS | Status: AC
  Administered 2022-12-26: 1000 mL via INTRAVENOUS

## 2022-12-26 MED ORDER — PANTOPRAZOLE SODIUM 40 MG PO TBEC: 40.0000 mg | DELAYED_RELEASE_TABLET | Freq: Every day | ORAL | Status: DC

## 2022-12-26 MED ORDER — HYDROXYZINE HCL 25 MG PO TABS
25.0000 mg | ORAL_TABLET | Freq: Once | ORAL | Status: AC
  Administered 2022-12-26: 25 mg via ORAL
  Filled 2022-12-26: qty 1

## 2022-12-26 MED ORDER — ONDANSETRON HCL 4 MG/2ML IJ SOLN
4.0000 mg | Freq: Once | INTRAMUSCULAR | Status: AC
  Administered 2022-12-26: 4 mg via INTRAVENOUS
  Filled 2022-12-26: qty 2

## 2022-12-26 MED ORDER — PANTOPRAZOLE SODIUM 40 MG PO TBEC
40.0000 mg | DELAYED_RELEASE_TABLET | Freq: Every day | ORAL | Status: DC
  Administered 2022-12-27: 40 mg via ORAL
  Filled 2022-12-26: qty 1

## 2022-12-26 MED ORDER — PANTOPRAZOLE SODIUM 40 MG IV SOLR
40.0000 mg | Freq: Once | INTRAVENOUS | Status: AC
  Administered 2022-12-26: 40 mg via INTRAVENOUS
  Filled 2022-12-26: qty 10

## 2022-12-26 MED ORDER — POTASSIUM CHLORIDE CRYS ER 20 MEQ PO TBCR: 40.0000 meq | EXTENDED_RELEASE_TABLET | Freq: Once | ORAL | Status: DC

## 2022-12-26 MED ORDER — POTASSIUM CHLORIDE 10 MEQ/100ML IV SOLN
10.0000 meq | INTRAVENOUS | Status: AC
  Filled 2022-12-26: qty 100

## 2022-12-26 MED ORDER — MAGNESIUM SULFATE 2 GM/50ML IV SOLN: 2.0000 g | Freq: Once | INTRAVENOUS | Status: DC

## 2022-12-26 MED ORDER — NIFEDIPINE ER OSMOTIC RELEASE 30 MG PO TB24
30.0000 mg | ORAL_TABLET | Freq: Every day | ORAL | Status: DC
  Administered 2022-12-27: 30 mg via ORAL
  Filled 2022-12-26 (×2): qty 1

## 2022-12-26 MED ORDER — SERTRALINE HCL 50 MG PO TABS
50.0000 mg | ORAL_TABLET | Freq: Every day | ORAL | Status: DC
  Administered 2022-12-26 – 2022-12-27 (×2): 50 mg via ORAL
  Filled 2022-12-26 (×2): qty 1

## 2022-12-26 MED ORDER — POTASSIUM CHLORIDE CRYS ER 20 MEQ PO TBCR
20.0000 meq | EXTENDED_RELEASE_TABLET | Freq: Once | ORAL | Status: AC
  Administered 2022-12-26: 20 meq via ORAL
  Filled 2022-12-26: qty 1

## 2022-12-26 NOTE — ED Provider Notes (Signed)
[facility] Provider Note   CSN: [id number] Arrival date & time: 12/25/22  2316     History  Chief Complaint  Patient presents with   Abdominal Pain    Deborah Juarez is a 32 y.o. female with history of asthma, anxiety, and GERD who presents to the ER complaining of vomiting and diarrhea for 2 days. Having generalized abdominal pain and weakness as well. Reports children are sick with similar symptoms. Also reports postpartum depression and severe anxiety with several recent stressors, which she feels are contributing to her symptoms today.    Abdominal Pain Associated symptoms: diarrhea, nausea and vomiting        Home Medications Prior to Admission medications   Medication Sig Start Date End Date Taking? Authorizing Provider  acetaminophen (TYLENOL) 325 MG tablet Take 2 tablets (650 mg total) by mouth every 4 (four) hours as needed (for pain scale < 4). 09/06/22   Christophe Louis, MD  ferrous sulfate 325 (65 FE) MG tablet Take 1 tablet (325 mg total) by mouth daily with breakfast. 09/06/22   Christophe Louis, MD  ibuprofen (ADVIL) 600 MG tablet Take 1 tablet (600 mg total) by mouth every 6 (six) hours as needed. 09/06/22   Christophe Louis, MD  NIFEdipine (ADALAT CC) 30 MG 24 hr tablet Take 1 tablet (30 mg total) by mouth daily. 09/07/22   Christophe Louis, MD  ondansetron (ZOFRAN) 8 MG tablet Take 8 mg by mouth every 8 (eight) hours as needed for nausea or vomiting.    [provider]  ondansetron (ZOFRAN-ODT) 4 MG disintegrating tablet Take 1-2 tablets (4-8 mg total) by mouth every 6 (six) hours as needed for nausea or vomiting. Patient not taking: Reported on 09/05/2022 04/18/22   Gavin Pound, CNM  pantoprazole (PROTONIX) 40 MG tablet Take 1 tablet (40 mg total) by mouth daily. 09/07/22   Christophe Louis, MD  Prenatal Vit-Fe Fumarate-FA (PRENATAL VITAMIN PO) Take 1 tablet by mouth daily.    [provider]  pseudoephedrine (SUDAFED)  30 MG tablet Take 2 tablets (60 mg total) by mouth every 6 (six) hours as needed for congestion. Patient not taking: Reported on 09/05/2022 05/13/22   Gavin Pound, CNM      Allergies    Patient has no known allergies.    Review of Systems   Review of Systems  Gastrointestinal:  Positive for abdominal pain, diarrhea, nausea and vomiting.  Musculoskeletal:  Positive for myalgias.  Neurological:  Positive for weakness.  Psychiatric/Behavioral:  The patient is nervous/anxious.   All other systems reviewed and are negative.   Physical Exam Updated Vital Signs BP 127/89   Pulse 90   Temp 98.8 F (37.1 C) (Oral)   Resp 16   Ht 5\' 7"  (1.702 m)   Wt 69.4 kg   SpO2 100%   BMI 23.96 kg/m  Physical Exam Vitals and nursing note reviewed.  Constitutional:      Appearance: Normal appearance.     Comments: Tearful on exam  HENT:     Head: Normocephalic and atraumatic.  Eyes:     Conjunctiva/sclera: Conjunctivae normal.  Cardiovascular:     Rate and Rhythm: Normal rate and regular rhythm.  Pulmonary:     Effort: Pulmonary effort is normal. No respiratory distress.     Breath sounds: Normal breath sounds.  Abdominal:     General: There is no distension.     Palpations: Abdomen is soft.     Tenderness: There  is no abdominal tenderness.  Skin:    General: Skin is warm and dry.  Neurological:     General: No focal deficit present.     Mental Status: She is alert.     ED Results / Procedures / Treatments   Labs (all labs ordered are listed, but only abnormal results are displayed) Labs Reviewed  COMPREHENSIVE METABOLIC PANEL - Abnormal; Notable for the following components:      Result Value   Potassium 3.1 (*)    CO2 13 (*)    Calcium 8.6 (*)    AST 189 (*)    ALT 74 (*)    Anion gap 27 (*)    All other components within normal limits  URINALYSIS, ROUTINE W REFLEX MICROSCOPIC - Abnormal; Notable for the following components:   APPearance HAZY (*)    Ketones, ur 80 (*)     Protein, ur 30 (*)    All other components within normal limits  LACTIC ACID, PLASMA - Abnormal; Notable for the following components:   Lactic Acid, Venous 4.1 (*)    All other components within normal limits  COMPREHENSIVE METABOLIC PANEL - Abnormal; Notable for the following components:   CO2 16 (*)    Calcium 8.3 (*)    AST 160 (*)    ALT 87 (*)    Anion gap 20 (*)    All other components within normal limits  LIPASE, BLOOD  CBC  LACTIC ACID, PLASMA  MAGNESIUM  I-STAT BETA HCG BLOOD, ED ([facility], [facility], [facility] ONLY)    EKG EKG Interpretation  Date/Time:  Tuesday December 26 2022 09:10:17 EST Ventricular Rate:  84 PR Interval:  136 QRS Duration: 101 QT Interval:  438 QTC Calculation: 518 R Axis:   87 Text Interpretation: Sinus rhythm RSR' in V1 or V2, right VCD or RVH Abnrm T, consider ischemia, anterolateral lds Prolonged QT interval Nonspecific T wave abnormality QT has lengthened Confirmed by Ezequiel Essex [phone]) on 12/26/2022 9:54:50 AM  Radiology No results found.  Procedures Procedures    Medications Ordered in ED Medications  potassium chloride 10 mEq in 100 mL IVPB (10 mEq Intravenous Not Given 12/26/22 1518)  potassium chloride SA (KLOR-CON M) CR tablet 20 mEq (has no administration in time range)  hydrOXYzine (ATARAX) tablet 25 mg (has no administration in time range)  sodium chloride 0.9 % bolus 1,000 mL (0 mLs Intravenous Stopped 12/26/22 1111)  LORazepam (ATIVAN) injection 1 mg (1 mg Intravenous Given 12/26/22 0911)  ondansetron (ZOFRAN) injection 4 mg (4 mg Intravenous Given 12/26/22 0911)  sodium chloride 0.9 % bolus 1,000 mL (0 mLs Intravenous Stopped 12/26/22 1313)  sodium chloride 0.9 % bolus 1,000 mL (0 mLs Intravenous Stopped 12/26/22 1523)    ED Course/ Medical Decision Making/ A&P                             Medical Decision Making Amount and/or Complexity of Data Reviewed Labs: ordered.  Risk Prescription drug management.  This patient is a 32 y.o.  female  who presents to the ED for concern of abdominal pain, vomiting and diarrhea x 2 days.   Differential diagnoses prior to evaluation: The emergent differential diagnosis includes, but is not limited to,  AAA, mesenteric ischemia, appendicitis, diverticulitis, DKA, gastroenteritis, nephrolithiasis, pancreatitis, constipation, UTI, bowel obstruction, biliary disease, IBD, PUD, hepatitis, ectopic pregnancy, ovarian torsion, PID. This is not an exhaustive differential.   Past Medical History / Co-morbidities: Anxiety,  asthma, GERD, postpartum depression  Physical Exam: Physical exam performed. The pertinent findings include: Patient initially tachycardic to 113.  Afebrile, but feels warm to the touch.  Abdomen soft with generalized tenderness.  No guarding.  Patient initially tearful and feeling very anxious.  Lab Tests/Imaging studies: I personally interpreted labs/imaging and the pertinent results include: No leukocytosis, normal hemoglobin.  Potassium 3.1, CO2 13, AST 189, ALT 74, anion gap 27.  Suspect starvation ketosis.  Urinalysis with ketones as well.  Pregnancy negative.  Initial lactic acid 4.1, repeat 1.6 after IV fluids.  Repeat metabolic panel with anion gap of 20, CO2 16. Persistent LFT elevation.   Cardiac monitoring: EKG obtained and interpreted by my attending physician which shows: Sinus rhythm with RSR' in V1 or V2, abnormal nonspecific T waves, prolonged QT interval, QTc 518   Medications: I ordered medication including IV fluids, antiemetics, Ativan for anxiety, hydroxyzine for insomnia, potassium replacement.  I have reviewed the patients home medicines and have made adjustments as needed.  Upon reevaluation patient states that her anxiety and nausea have completely resolved.  She is feeling much better and clinically appears better.  Consultations obtained: EKG reviewed with cardiologist Dr Margaretann Loveless who has low concern for Brugada. Did recommend repeat EKG with leads  higher on the chest, potassium replacement, and echocardiogram, to be done inpatient vs outpatient depending on disposition.   Consulted with hospitalist Dr Olevia Bowens who will admit.    Disposition: After consideration of the diagnostic results and the patients response to treatment, I feel that patient would benefit from continued IV fluids and electrolyte replacement due to persistent metabolic acidosis, likely in the setting of dehydration with viral GI infection.   I discussed this case with my attending physician Dr. Wyvonnia Dusky who cosigned this note including patient's presenting symptoms, physical exam, and planned diagnostics and interventions. Attending physician stated agreement with plan or made changes to plan which were implemented.   Final Clinical Impression(s) / ED Diagnoses Final diagnoses:  Metabolic acidosis  Dehydration  Vomiting and diarrhea    Rx / DC Orders ED Discharge Orders     None      Portions of this report may have been transcribed using voice recognition software. Every effort was made to ensure accuracy; however, inadvertent computerized transcription errors may be present.    Estill Cotta 12/26/22 1532    Ezequiel Essex, MD 12/26/22 1719

## 2022-12-26 NOTE — ED Notes (Signed)
Family at bedside pt given warm blankets

## 2022-12-26 NOTE — ED Notes (Signed)
Pt eating food brought in by family. Tolerating well

## 2022-12-26 NOTE — ED Notes (Signed)
Pt delivered a dinner tray

## 2022-12-26 NOTE — H&P (Addendum)
History and Physical    Patient: Deborah Juarez UDJ:[id number] DOB: November 13, 1991 DOA: 12/25/2022 DOS: the patient was seen and examined on 12/26/2022 PCP: Jonathon Jordan, MD  Patient coming from: Home  Chief Complaint:  Chief Complaint  Patient presents with   Abdominal Pain   HPI: Deborah Juarez is a 32 y.o. female with medical history significant of anxiety, asthma, GERD who presented to the emergency department complaints of abdominal pain associated with nausea, vomiting, diarrhea and weakness for the past 2 days.  She has have over 20 episodes of emesis and loose stools.  Her children are also experiencing the same symptoms.  No constipation, melena or hematochezia.  No flank pain, dysuria, frequency or hematuria.  She has also been experiencing frequent palpitations and occasional lightheadedness.  She gets occasional pleuritic discomfort on her suprasternal area after deep inspiration that usually last only for few days.  No fever or chills. No sore throat, rhinorrhea, dyspnea, wheezing or hemoptysis.  No chest pain, palpitations, diaphoresis, PND, orthopnea or pitting edema of the lower extremities.  No polyuria, polydipsia, polyphagia or blurred vision.  Lab work: Her urinalysis was hazy with ketonuria of 80 and proteinuria of 30 mg/dL.  CBC was normal as well as lipase.  Lactic acid initially 4.1 then 1.6 mmol/L.  Magnesium is 1.8 mg/dL.  CMP showed a potassium of 3.1 and CO2 of 13 mmol/L with an anion gap of 27.  Calcium 8.6 mg/dL, AST 189 and ALT 74 units/L.  The rest of the CMP measurements were normal.  EKG was sinus rhythm with RSR in V1 or V2 right VCD or RVH.,  Prolonged QT interval and nonspecific T wave abnormality.  ED course: Initial vital signs were temperature 97.4 F, pulse 113, respirations 22, BP 119/76 mmHg and O2 sat 100% on room air.  The patient received 3000 mL of LR bolus, KCl 20 mill equivalents p.o. x 1, ondansetron 4 mg IVP, lorazepam 1 mg IVP and  hydroxyzine 25 mg p.o. x 1.  EKG findings discussed with cardiology on-call who recommended echocardiogram and repeat EKG along with outpatient follow-up with the cardiology team.   Review of Systems: As mentioned in the history of present illness. All other systems reviewed and are negative. Past Medical History:  Diagnosis Date   Anxiety    Asthma    "long " time since used rescue inhaler   GERD (gastroesophageal reflux disease)    Past Surgical History:  Procedure Laterality Date   BREAST SURGERY     CESAREAN SECTION N/A 03/03/2015   Procedure: CESAREAN SECTION;  Surgeon: Janyth Pupa, DO;  Location: [facility];  Service: Obstetrics;  Laterality: N/A;   Social History:  reports that she has never smoked. She has never used smokeless tobacco. She reports that she does not currently use alcohol after a past usage of about 3.0 standard drinks of alcohol per week. She reports that she does not use drugs.  No Known Allergies  Family History  Problem Relation Age of Onset   Cancer Father        Pacreatic Ca   Heart disease Father    Heart disease Maternal Grandfather    Heart disease Paternal Grandfather    Stroke Paternal Grandfather     Prior to Admission medications   Medication Sig Start Date End Date Taking? Authorizing Provider  acetaminophen (TYLENOL) 325 MG tablet Take 2 tablets (650 mg total) by mouth every 4 (four) hours as needed (for pain scale < 4). 09/06/22  Christophe Louis, MD  ferrous sulfate 325 (65 FE) MG tablet Take 1 tablet (325 mg total) by mouth daily with breakfast. 09/06/22   Christophe Louis, MD  ibuprofen (ADVIL) 600 MG tablet Take 1 tablet (600 mg total) by mouth every 6 (six) hours as needed. 09/06/22   Christophe Louis, MD  NIFEdipine (ADALAT CC) 30 MG 24 hr tablet Take 1 tablet (30 mg total) by mouth daily. 09/07/22   Christophe Louis, MD  ondansetron (ZOFRAN) 8 MG tablet Take 8 mg by mouth every 8 (eight) hours as needed for nausea or vomiting.    [provider]   ondansetron (ZOFRAN-ODT) 4 MG disintegrating tablet Take 1-2 tablets (4-8 mg total) by mouth every 6 (six) hours as needed for nausea or vomiting. Patient not taking: Reported on 09/05/2022 04/18/22   Gavin Pound, CNM  pantoprazole (PROTONIX) 40 MG tablet Take 1 tablet (40 mg total) by mouth daily. 09/07/22   Christophe Louis, MD  Prenatal Vit-Fe Fumarate-FA (PRENATAL VITAMIN PO) Take 1 tablet by mouth daily.    [provider]  pseudoephedrine (SUDAFED) 30 MG tablet Take 2 tablets (60 mg total) by mouth every 6 (six) hours as needed for congestion. Patient not taking: Reported on 09/05/2022 05/13/22   Gavin Pound, CNM    Physical Exam: Vitals:   12/26/22 1200 12/26/22 1300 12/26/22 1432 12/26/22 1445  BP: 132/83 132/73  127/89  Pulse: 95 91  90  Resp: 15   16  Temp:   98.8 F (37.1 C)   TempSrc:   Oral   SpO2: 100% 100%  100%  Weight:      Height:       Physical Exam Vitals and nursing note reviewed.  Constitutional:      General: She is awake. She is not in acute distress.    Appearance: She is well-developed.  HENT:     Head: Normocephalic.     Nose: No rhinorrhea.     Mouth/Throat:     Mouth: Mucous membranes are dry.  Eyes:     General: No scleral icterus.    Pupils: Pupils are equal, round, and reactive to light.  Cardiovascular:     Rate and Rhythm: Normal rate and regular rhythm.  Pulmonary:     Effort: Pulmonary effort is normal.     Breath sounds: Normal breath sounds.  Abdominal:     General: Bowel sounds are normal.     Palpations: Abdomen is soft.     Tenderness: There is abdominal tenderness.  Musculoskeletal:     Cervical back: Neck supple.     Right lower leg: No edema.     Left lower leg: No edema.  Skin:    General: Skin is warm and dry.  Neurological:     General: No focal deficit present.     Mental Status: She is alert and oriented to person, place, and time.  Psychiatric:        Mood and Affect: Mood normal.        Behavior: Behavior  normal. Behavior is cooperative.    Data Reviewed:  Results are pending, will review when available.  EKG: Vent. rate 84 BPM PR interval 136 ms QRS duration 101 ms QT/QTcB 438/518 ms P-R-T axes 68 87 77 Sinus rhythm RSR' in V1 or V2, right VCD or RVH Abnrm T, consider ischemia, anterolateral lds Prolonged QT interval  Assessment and Plan: Principal Problem:   Metabolic acidosis  In the setting of:   Acute gastroenteritis Improved lactic  acidosis. Observation/telemetry. Continue IV fluids. Clear liquid diet. Analgesics as needed. Antiemetics as needed. Pantoprazole 40 mg IVP x 1. Then pantoprazole 40 mg p.o. daily. Recheck BMP this evening. Follow CBC, CMP and lipase in AM.  Active Problems:   Abnormal ECG   Palpitations   Prolonged QT interval Avoid QT prolonging meds as possible. Supplemented with KCl Magnesium sulfate 2 g IVPB x 1.. Keep electrolytes optimized. Check EKG in the morning. Check echocardiogram. Cardiology follow-up as an outpatient. Advised to cease or decrease caffeine intake.    Transaminitis Had transaminitis almost 2 years ago. Check hepatitis panel. Recheck LFTs in the morning.    Asthma Asymptomatic at this time. Bronchodilators and O2 as needed.    Anxiety   Depressive disorder Continue Zoloft 50 mg p.o. daily. Follow-up with PCP and/or behavioral health.     Advance Care Planning:   Code Status: Full Code   Consults:   Family Communication: Her spouse was at bedside.  Severity of Illness: The appropriate patient status for this patient is OBSERVATION. Observation status is judged to be reasonable and necessary in order to provide the required intensity of service to ensure the patient's safety. The patient's presenting symptoms, physical exam findings, and initial radiographic and laboratory data in the context of their medical condition is felt to place them at decreased risk for further clinical deterioration.  Furthermore, it is anticipated that the patient will be medically stable for discharge from the hospital within 2 midnights of admission.   Author: Reubin Milan, MD 12/26/2022 3:35 PM  For on call review [email or web address].   This document was prepared using Dragon voice recognition software and may contain some unintended transcription errors.

## 2022-12-27 ENCOUNTER — Observation Stay (HOSPITAL_BASED_OUTPATIENT_CLINIC_OR_DEPARTMENT_OTHER): Payer: Medicaid Other

## 2022-12-27 DIAGNOSIS — R9431 Abnormal electrocardiogram [ECG] [EKG]: Secondary | ICD-10-CM

## 2022-12-27 DIAGNOSIS — K529 Noninfective gastroenteritis and colitis, unspecified: Secondary | ICD-10-CM | POA: Diagnosis not present

## 2022-12-27 LAB — COMPREHENSIVE METABOLIC PANEL
ALT: 65 U/L — ABNORMAL HIGH (ref 0–44)
AST: 110 U/L — ABNORMAL HIGH (ref 15–41)
Albumin: 3.1 g/dL — ABNORMAL LOW (ref 3.5–5.0)
Alkaline Phosphatase: 45 U/L (ref 38–126)
Anion gap: 12 (ref 5–15)
BUN: 8 mg/dL (ref 6–20)
CO2: 19 mmol/L — ABNORMAL LOW (ref 22–32)
Calcium: 7.9 mg/dL — ABNORMAL LOW (ref 8.9–10.3)
Chloride: 103 mmol/L (ref 98–111)
Creatinine, Ser: 0.83 mg/dL (ref 0.44–1.00)
GFR, Estimated: >60 mL/min (ref >60)
Glucose, Bld: 68 mg/dL — ABNORMAL LOW (ref 70–99)
Potassium: 3.9 mmol/L (ref 3.5–5.1)
Sodium: 134 mmol/L — ABNORMAL LOW (ref 135–145)
Total Bilirubin: 1.1 mg/dL (ref 0.3–1.2)
Total Protein: 5.6 g/dL — ABNORMAL LOW (ref 6.5–8.1)

## 2022-12-27 LAB — CBC
HCT: 34.7 % — ABNORMAL LOW (ref 36.0–46.0)
Hemoglobin: 10.9 g/dL — ABNORMAL LOW (ref 12.0–15.0)
MCH: 30 pg (ref 26.0–34.0)
MCHC: 31.4 g/dL (ref 30.0–36.0)
MCV: 95.6 fL (ref 80.0–100.0)
Platelets: 132 10*3/uL — ABNORMAL LOW (ref 150–400)
RBC: 3.63 MIL/uL — ABNORMAL LOW (ref 3.87–5.11)
RDW: 13.9 % (ref 11.5–15.5)
WBC: 4.9 10*3/uL (ref 4.0–10.5)
nRBC: 0 % (ref 0.0–0.2)

## 2022-12-27 LAB — ECHOCARDIOGRAM COMPLETE
Area-P 1/2: 3.74 cm2
Height: 67 in
S' Lateral: 2.5 cm
Weight: 2448 oz

## 2022-12-27 LAB — HIV ANTIBODY (ROUTINE TESTING W REFLEX): HIV Screen 4th Generation wRfx: NONREACTIVE

## 2022-12-27 MED ORDER — LORAZEPAM 1 MG PO TABS
1.0000 mg | ORAL_TABLET | Freq: Once | ORAL | Status: AC
  Administered 2022-12-27: 1 mg via ORAL
  Filled 2022-12-27: qty 1

## 2022-12-27 NOTE — Progress Notes (Signed)
Echocardiogram 2D Echocardiogram has been performed.  Oneal Deputy Makynzee Tigges RDCS 12/27/2022, 8:07 AM

## 2022-12-27 NOTE — Discharge Summary (Signed)
Physician Discharge Summary   Patient: Deborah Juarez MRN: [id number] DOB: [birth year]  Admit date:     12/25/2022  Discharge date: 12/27/22  Discharge Physician: Lucienne Minks    PCP: Jonathon Jordan, MD   Recommendations at discharge:    Please follow up with your PCP in 1 - 2 weeks  Discharge Diagnoses: Principal Problem:   Acute gastroenteritis Active Problems:   Asthma   Anxiety   Depressive disorder   Abnormal ECG   Palpitations   Prolonged QT interval   Metabolic acidosis   Transaminitis  Resolved Problems:   * No resolved hospital problems. *  Hospital Course: 32 yo F who presents to the hospital for management of acute gastroenteritis. She was placed on IV LR 125 cc/hr and protonix 40 mg PO daily. She received compazine 10 mg q6 hr PRN to help with nausea and vomiting. She was continued on zoloft 50 mg PO daily. Electrolytes were replaced as needed (hypokalemia on presentation was resolved 3.1 -->3.9).  LFT's also noted to improved with IV fluid hydration. Lactic acid resolved 4.1 --> 1.6. UA negative for UTI. She was feeling better on the morning of 12/27/2022 and requesting discharge home. F/u with PCP in 1-2 weeks.  Assessment and Plan: No notes have been filed under this hospital service. Service: Hospitalist     Disposition: Home Diet recommendation:  Regular diet DISCHARGE MEDICATION: Allergies as of 12/27/2022   No Known Allergies      Medication List     TAKE these medications    acetaminophen 325 MG tablet Commonly known as: Tylenol Take 2 tablets (650 mg total) by mouth every 4 (four) hours as needed (for pain scale < 4).   ferrous sulfate 325 (65 FE) MG tablet Take 1 tablet (325 mg total) by mouth daily with breakfast.   ibuprofen 200 MG tablet Commonly known as: ADVIL Take 400 mg by mouth daily as needed for headache, mild pain or moderate pain.   ibuprofen 600 MG tablet Commonly known as: ADVIL Take 1 tablet (600 mg total) by  mouth every 6 (six) hours as needed.   NIFEdipine 30 MG 24 hr tablet Commonly known as: ADALAT CC Take 1 tablet (30 mg total) by mouth daily.   pantoprazole 40 MG tablet Commonly known as: PROTONIX Take 1 tablet (40 mg total) by mouth daily.   pseudoephedrine 30 MG tablet Commonly known as: Sudafed Take 2 tablets (60 mg total) by mouth every 6 (six) hours as needed for congestion. What changed:  how much to take when to take this   sertraline 50 MG tablet Commonly known as: ZOLOFT Take 50 mg by mouth daily.        Discharge Exam: Filed Weights   12/25/22 2324  Weight: 69.4 kg   Condition at discharge: fair  The results of significant diagnostics from this hospitalization (including imaging, microbiology, ancillary and laboratory) are listed below for reference.   Imaging Studies: ECHOCARDIOGRAM COMPLETE  Result Date: 12/27/2022    ECHOCARDIOGRAM REPORT   Patient Name:   Deborah Juarez Date of Exam: 12/27/2022 Medical Rec #:  [id number]              Height:       67.0 in Accession #:    [id number]             Weight:       153.0 lb Date of Birth:  1991-01-24  BSA:          1.805 m Patient Age:    31 years               BP:           140/98 mmHg Patient Gender: F                      HR:           93 bpm. Exam Location:  Inpatient Procedure: 2D Echo, Color Doppler and Cardiac Doppler Indications:    R94.31 Abnormal EKG  History:        Patient has no prior history of Echocardiogram examinations.  Sonographer:    Raquel Sarna Senior RDCS Referring Phys: [id number] [place]  1. Left ventricular ejection fraction, by estimation, is 60 to 65%. The left ventricle has normal function. The left ventricle has no regional wall motion abnormalities. Left ventricular diastolic parameters were normal.  2. Right ventricular systolic function is normal. The right ventricular size is normal. Tricuspid regurgitation signal is inadequate for assessing PA pressure.  3.  The mitral valve is normal in structure. Trivial mitral valve regurgitation.  4. The aortic valve is tricuspid. Aortic valve regurgitation is not visualized. No aortic stenosis is present.  5. The inferior vena cava is normal in size with <50% respiratory variability, suggesting right atrial pressure of 8 mmHg. FINDINGS  Left Ventricle: Left ventricular ejection fraction, by estimation, is 60 to 65%. The left ventricle has normal function. The left ventricle has no regional wall motion abnormalities. The left ventricular internal cavity size was normal in size. There is  no left ventricular hypertrophy. Left ventricular diastolic parameters were normal. Right Ventricle: The right ventricular size is normal. No increase in right ventricular wall thickness. Right ventricular systolic function is normal. Tricuspid regurgitation signal is inadequate for assessing PA pressure. Left Atrium: Left atrial size was normal in size. Right Atrium: Right atrial size was normal in size. Pericardium: There is no evidence of pericardial effusion. Mitral Valve: The mitral valve is normal in structure. Trivial mitral valve regurgitation. Tricuspid Valve: The tricuspid valve is normal in structure. Tricuspid valve regurgitation is trivial. Aortic Valve: The aortic valve is tricuspid. Aortic valve regurgitation is not visualized. No aortic stenosis is present. Pulmonic Valve: The pulmonic valve was not well visualized. Pulmonic valve regurgitation is not visualized. Aorta: The aortic root and ascending aorta are structurally normal, with no evidence of dilitation. Venous: The inferior vena cava is normal in size with less than 50% respiratory variability, suggesting right atrial pressure of 8 mmHg. IAS/Shunts: The interatrial septum was not well visualized.  LEFT VENTRICLE PLAX 2D LVIDd:         4.00 cm   Diastology LVIDs:         2.50 cm   LV e' medial:    10.20 cm/s LV PW:         0.90 cm   LV E/e' medial:  6.2 LV IVS:        0.80 cm    LV e' lateral:   14.50 cm/s LVOT diam:     1.90 cm   LV E/e' lateral: 4.4 LV SV:         60 LV SV Index:   33 LVOT Area:     2.84 cm  RIGHT VENTRICLE RV S prime:     14.70 cm/s TAPSE (M-mode): 2.2 cm LEFT ATRIUM  Index        RIGHT ATRIUM           Index LA diam:        2.60 cm 1.44 cm/m   RA Area:     11.20 cm LA Vol (A2C):   31.0 ml 17.18 ml/m  RA Volume:   25.80 ml  14.30 ml/m LA Vol (A4C):   24.9 ml 13.80 ml/m LA Biplane Vol: 28.4 ml 15.74 ml/m  AORTIC VALVE LVOT Vmax:   106.00 cm/s LVOT Vmean:  71.800 cm/s LVOT VTI:    0.210 m  AORTA Ao Root diam: 2.90 cm Ao Asc diam:  3.00 cm MITRAL VALVE MV Area (PHT): 3.74 cm    SHUNTS MV Decel Time: 203 msec    Systemic VTI:  0.21 m MV E velocity: 63.40 cm/s  Systemic Diam: 1.90 cm MV A velocity: 69.70 cm/s MV E/A ratio:  0.91 Oswaldo Milian MD Electronically signed by Oswaldo Milian MD Signature Date/Time: 12/27/2022/9:13:11 AM    Final     Microbiology: Results for orders placed or performed during the hospital encounter of 09/05/22  OB RESULT CONSOLE Group B Strep     Status: None   Collection Time: 08/17/22 12:00 AM  Result Value Ref Range Status   GBS Negative  Final    Labs: CBC: Recent Labs  Lab 12/25/22 2329 12/27/22 0430  WBC 5.6 4.9  HGB 12.1 10.9*  HCT 37.4 34.7*  MCV 93.3 95.6  PLT 196 761*   Basic Metabolic Panel: Recent Labs  Lab 12/25/22 2329 12/26/22 1241 12/26/22 1525 12/27/22 0430  NA 141 136  --  134*  K 3.1* 4.0  --  3.9  CL 101 100  --  103  CO2 13* 16*  --  19*  GLUCOSE 71 72  --  68*  BUN 10 6  --  8  CREATININE 0.84 0.99  --  0.83  CALCIUM 8.6* 8.3*  --  7.9*  MG  --   --  1.8  --    Liver Function Tests: Recent Labs  Lab 12/25/22 2329 12/26/22 1241 12/27/22 0430  AST 189* 160* 110*  ALT 74* 87* 65*  ALKPHOS 63 62 45  BILITOT 0.9 1.0 1.1  PROT 7.4 7.3 5.6*  ALBUMIN 4.3 4.1 3.1*   CBG: No results for input(s): "GLUCAP" in the last 168 hours.  Discharge time spent:  greater than 30 minutes.  Signed: Lucienne Minks , MD [facility] 12/27/2022

## 2022-12-27 NOTE — ED Notes (Signed)
Pt resting in bed with no acute distress at this time. 

## 2023-01-12 IMAGING — CR DG CHEST 2V
2 series · 2 of 2 positions shown · non-contrast
Comparison: None.

CLINICAL DATA: Chest pain

EXAM:
CHEST - 2 VIEW

[w chest pa]
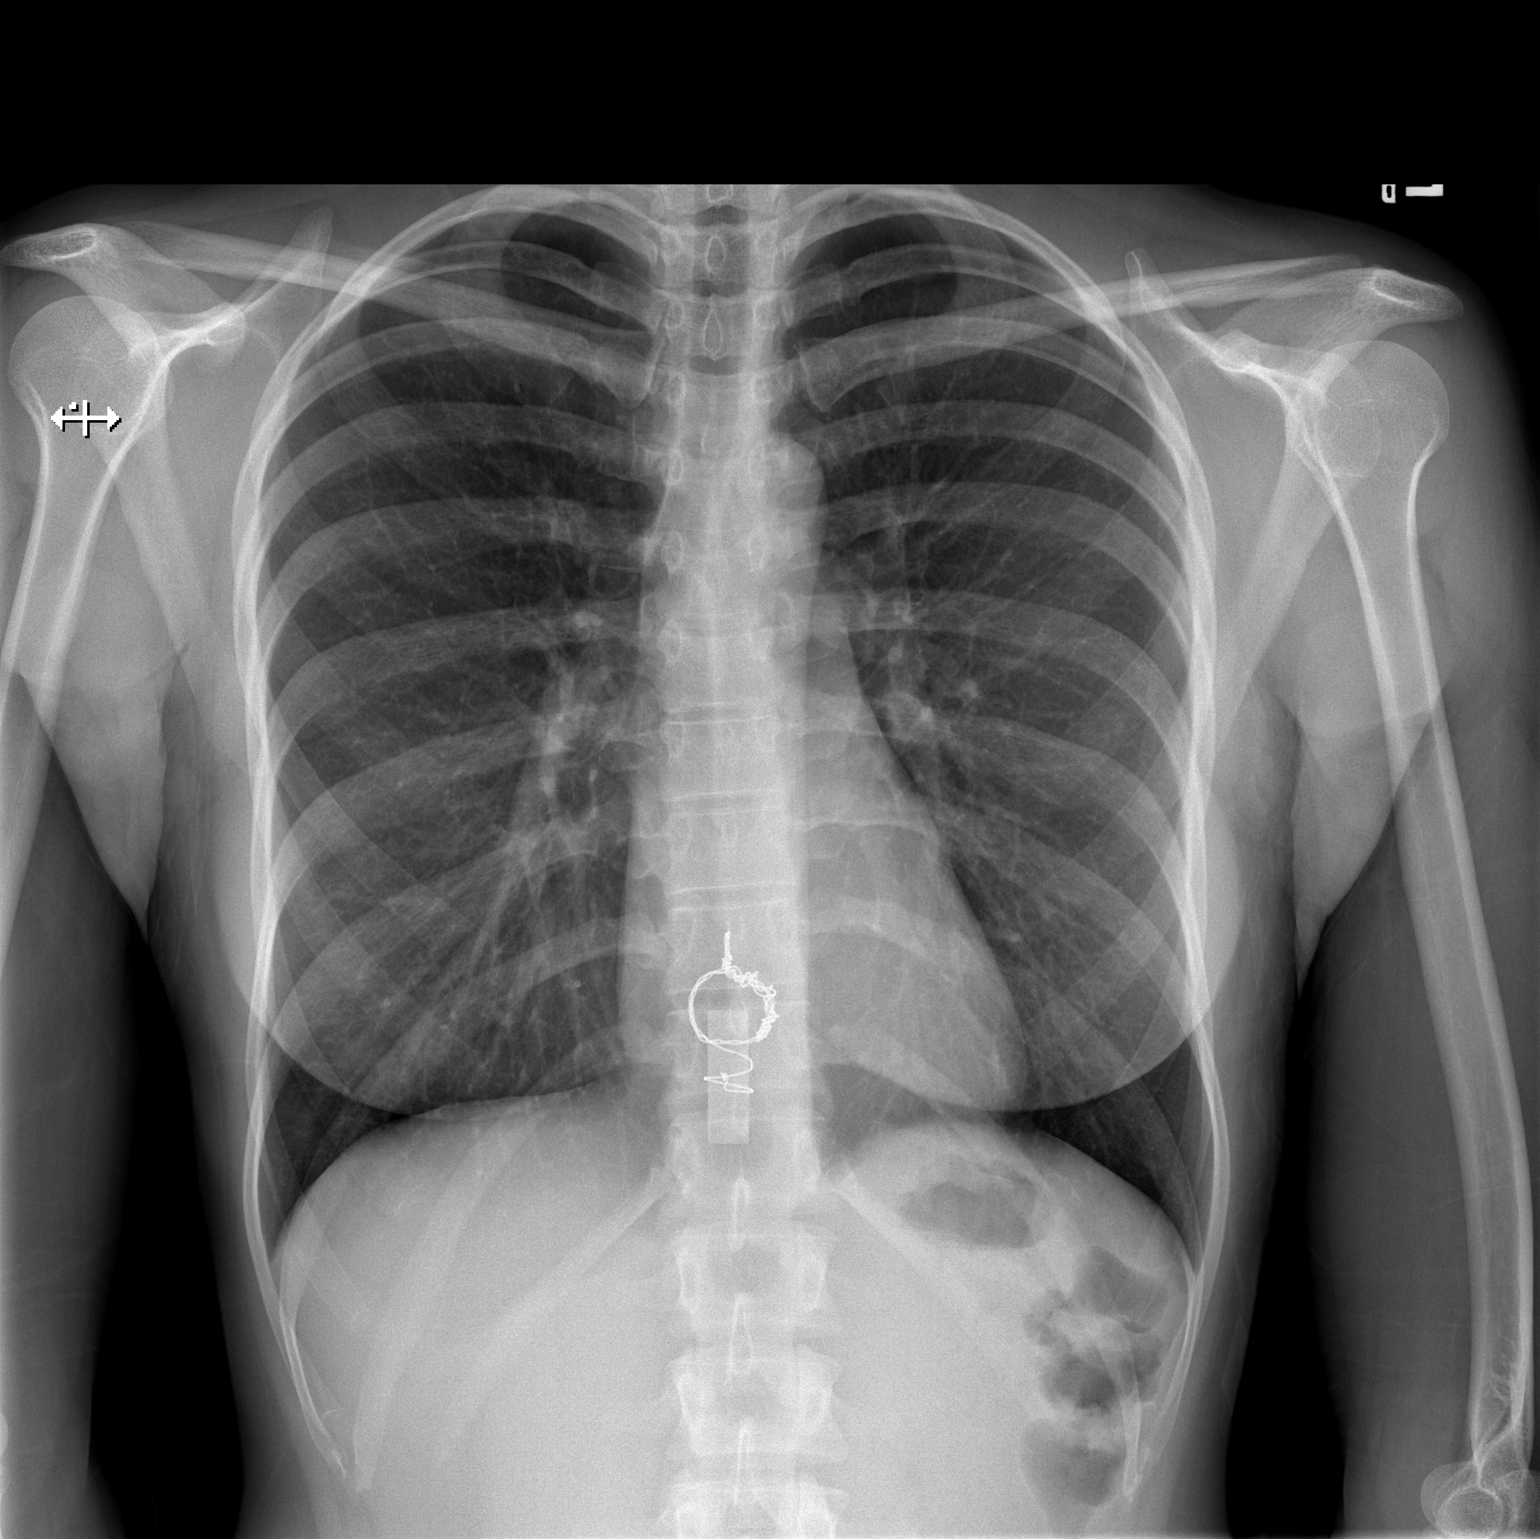

[w chest lat]
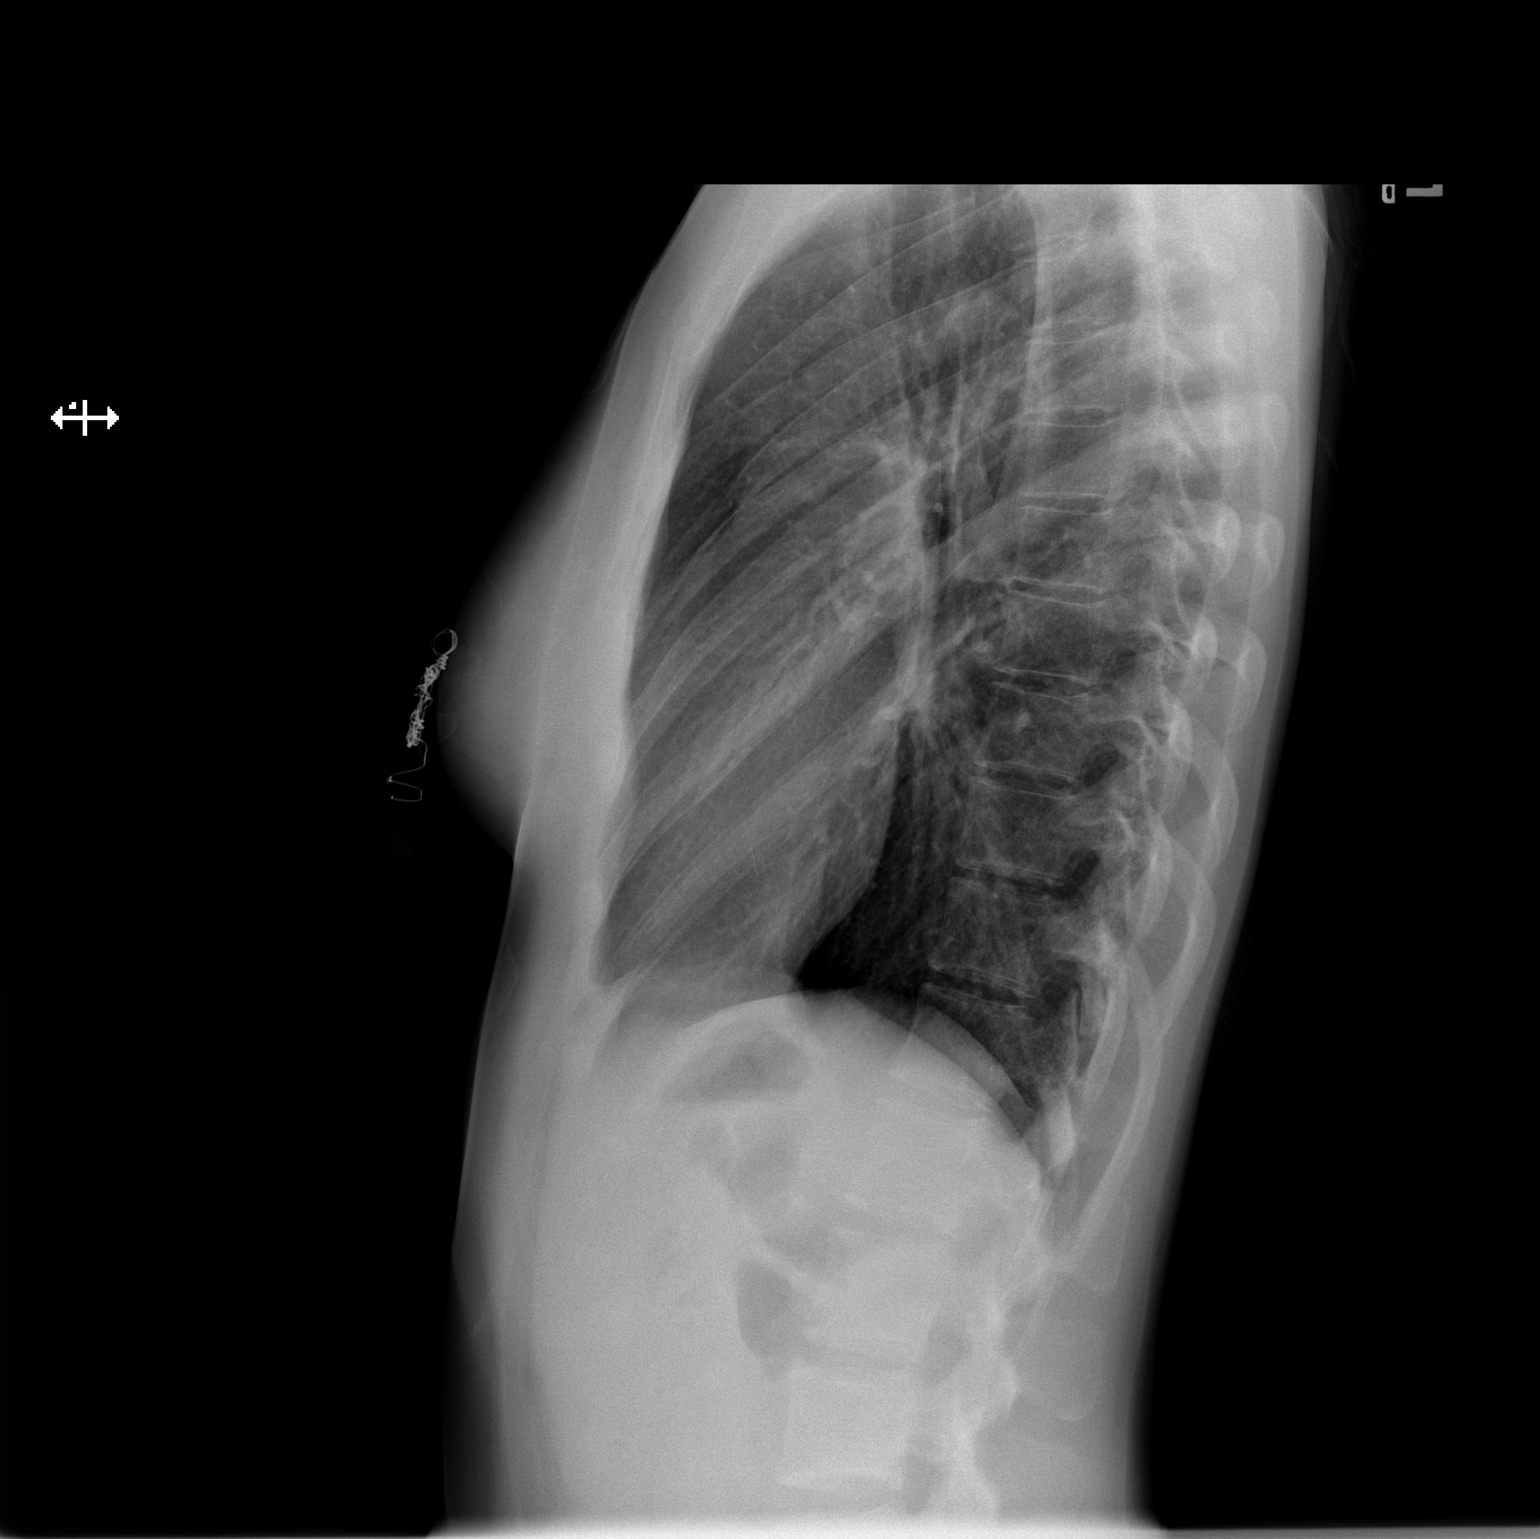

[2 of 2 positions shown; findings below may reference images not displayed]

FINDINGS: The heart size and mediastinal contours are within normal limits.
Both lungs are clear. The visualized skeletal structures are
unremarkable.
IMPRESSION: No active cardiopulmonary disease.

## 2023-04-19 ENCOUNTER — Emergency Department (HOSPITAL_BASED_OUTPATIENT_CLINIC_OR_DEPARTMENT_OTHER): Payer: Medicaid Other | Admitting: Radiology

## 2023-04-19 ENCOUNTER — Other Ambulatory Visit: Payer: Self-pay

## 2023-04-19 ENCOUNTER — Inpatient Hospital Stay (HOSPITAL_BASED_OUTPATIENT_CLINIC_OR_DEPARTMENT_OTHER)
Admission: EM | Admit: 2023-04-19 | Discharge: 2023-04-21 | DRG: 392 | Disposition: A | Discharge status: Home / Self Care | Payer: Medicaid Other | Attending: Internal Medicine | Admitting: Internal Medicine

## 2023-04-19 ENCOUNTER — Encounter (HOSPITAL_BASED_OUTPATIENT_CLINIC_OR_DEPARTMENT_OTHER): Payer: Self-pay | Admitting: Emergency Medicine

## 2023-04-19 DIAGNOSIS — J45909 Unspecified asthma, uncomplicated: Secondary | ICD-10-CM | POA: Diagnosis present

## 2023-04-19 DIAGNOSIS — E8729 Other acidosis: Secondary | ICD-10-CM

## 2023-04-19 DIAGNOSIS — R112 Nausea with vomiting, unspecified: Secondary | ICD-10-CM

## 2023-04-19 DIAGNOSIS — Z79899 Other long term (current) drug therapy: Secondary | ICD-10-CM

## 2023-04-19 DIAGNOSIS — E871 Hypo-osmolality and hyponatremia: Secondary | ICD-10-CM | POA: Diagnosis present

## 2023-04-19 DIAGNOSIS — Z8249 Family history of ischemic heart disease and other diseases of the circulatory system: Secondary | ICD-10-CM

## 2023-04-19 DIAGNOSIS — Z800 Family history of malignant neoplasm of digestive organs: Secondary | ICD-10-CM

## 2023-04-19 DIAGNOSIS — E860 Dehydration: Secondary | ICD-10-CM

## 2023-04-19 DIAGNOSIS — K529 Noninfective gastroenteritis and colitis, unspecified: Principal | ICD-10-CM | POA: Diagnosis present

## 2023-04-19 DIAGNOSIS — R7989 Other specified abnormal findings of blood chemistry: Secondary | ICD-10-CM | POA: Diagnosis present

## 2023-04-19 DIAGNOSIS — E8721 Acute metabolic acidosis: Secondary | ICD-10-CM | POA: Diagnosis present

## 2023-04-19 DIAGNOSIS — K8020 Calculus of gallbladder without cholecystitis without obstruction: Secondary | ICD-10-CM | POA: Diagnosis present

## 2023-04-19 DIAGNOSIS — E876 Hypokalemia: Secondary | ICD-10-CM | POA: Diagnosis present

## 2023-04-19 DIAGNOSIS — Z823 Family history of stroke: Secondary | ICD-10-CM

## 2023-04-19 DIAGNOSIS — K760 Fatty (change of) liver, not elsewhere classified: Secondary | ICD-10-CM | POA: Diagnosis present

## 2023-04-19 DIAGNOSIS — K219 Gastro-esophageal reflux disease without esophagitis: Secondary | ICD-10-CM | POA: Diagnosis present

## 2023-04-19 DIAGNOSIS — F419 Anxiety disorder, unspecified: Secondary | ICD-10-CM | POA: Diagnosis present

## 2023-04-19 DIAGNOSIS — Z1152 Encounter for screening for COVID-19: Secondary | ICD-10-CM

## 2023-04-19 HISTORY — DX: Postpartum depression: F53.0

## 2023-04-19 LAB — CBC
HCT: 38.2 % (ref 36.0–46.0)
Hemoglobin: 12.5 g/dL (ref 12.0–15.0)
MCH: 31.3 pg (ref 26.0–34.0)
MCHC: 32.7 g/dL (ref 30.0–36.0)
MCV: 95.5 fL (ref 80.0–100.0)
Platelets: 212 10*3/uL (ref 150–400)
RBC: 4 MIL/uL (ref 3.87–5.11)
RDW: 12.9 % (ref 11.5–15.5)
WBC: 8.2 10*3/uL (ref 4.0–10.5)
nRBC: 0 % (ref 0.0–0.2)

## 2023-04-19 LAB — COMPREHENSIVE METABOLIC PANEL
ALT: 63 U/L — ABNORMAL HIGH (ref 0–44)
AST: 112 U/L — ABNORMAL HIGH (ref 15–41)
Albumin: 5 g/dL (ref 3.5–5.0)
Alkaline Phosphatase: 67 U/L (ref 38–126)
Anion gap: 28 — ABNORMAL HIGH (ref 5–15)
BUN: 8 mg/dL (ref 6–20)
CO2: 19 mmol/L — ABNORMAL LOW (ref 22–32)
Calcium: 9.6 mg/dL (ref 8.9–10.3)
Chloride: 92 mmol/L — ABNORMAL LOW (ref 98–111)
Creatinine, Ser: 0.78 mg/dL (ref 0.44–1.00)
GFR, Estimated: >60 mL/min (ref >60)
Glucose, Bld: 73 mg/dL (ref 70–99)
Potassium: 4.2 mmol/L (ref 3.5–5.1)
Sodium: 139 mmol/L (ref 135–145)
Total Bilirubin: 1.3 mg/dL — ABNORMAL HIGH (ref 0.3–1.2)
Total Protein: 8.5 g/dL — ABNORMAL HIGH (ref 6.5–8.1)

## 2023-04-19 LAB — URINALYSIS, ROUTINE W REFLEX MICROSCOPIC
Bacteria, UA: NONE SEEN
Bilirubin Urine: NEGATIVE
Glucose, UA: NEGATIVE mg/dL
Ketones, ur: >80 mg/dL — AB
Nitrite: NEGATIVE
Protein, ur: 30 mg/dL — AB
Specific Gravity, Urine: 1.025 (ref 1.005–1.030)
pH: 6 (ref 5.0–8.0)

## 2023-04-19 LAB — BASIC METABOLIC PANEL
Anion gap: 22 — ABNORMAL HIGH (ref 5–15)
BUN: 6 mg/dL (ref 6–20)
CO2: 19 mmol/L — ABNORMAL LOW (ref 22–32)
Calcium: 8.3 mg/dL — ABNORMAL LOW (ref 8.9–10.3)
Chloride: 96 mmol/L — ABNORMAL LOW (ref 98–111)
Creatinine, Ser: 0.64 mg/dL (ref 0.44–1.00)
GFR, Estimated: >60 mL/min (ref >60)
Glucose, Bld: 68 mg/dL — ABNORMAL LOW (ref 70–99)
Potassium: 3.3 mmol/L — ABNORMAL LOW (ref 3.5–5.1)
Sodium: 137 mmol/L (ref 135–145)

## 2023-04-19 LAB — PREGNANCY, URINE: Preg Test, Ur: NEGATIVE

## 2023-04-19 LAB — CBG MONITORING, ED: Glucose-Capillary: 74 mg/dL (ref 70–99)

## 2023-04-19 LAB — RAPID URINE DRUG SCREEN, HOSP PERFORMED
Amphetamines: NOT DETECTED
Barbiturates: NOT DETECTED
Benzodiazepines: POSITIVE — AB
Cocaine: NOT DETECTED
Opiates: NOT DETECTED
Tetrahydrocannabinol: POSITIVE — AB

## 2023-04-19 LAB — LIPASE, BLOOD: Lipase: 79 U/L — ABNORMAL HIGH (ref 11–51)

## 2023-04-19 LAB — RESP PANEL BY RT-PCR (RSV, FLU A&B, COVID)  RVPGX2
Influenza A by PCR: NEGATIVE
Influenza B by PCR: NEGATIVE
Resp Syncytial Virus by PCR: NEGATIVE
SARS Coronavirus 2 by RT PCR: NEGATIVE

## 2023-04-19 MED ORDER — LORAZEPAM 2 MG/ML IJ SOLN
1.0000 mg | Freq: Once | INTRAMUSCULAR | Status: AC
  Administered 2023-04-19: 1 mg via INTRAVENOUS
  Filled 2023-04-19: qty 1

## 2023-04-19 MED ORDER — KCL IN DEXTROSE-NACL 20-5-0.45 MEQ/L-%-% IV SOLN
INTRAVENOUS | Status: DC
  Filled 2023-04-19 (×2): qty 1000

## 2023-04-19 MED ORDER — ONDANSETRON 4 MG PO TBDP
4.0000 mg | ORAL_TABLET | Freq: Once | ORAL | Status: AC
  Administered 2023-04-19: 4 mg via ORAL
  Filled 2023-04-19: qty 1

## 2023-04-19 MED ORDER — ALUM & MAG HYDROXIDE-SIMETH 200-200-20 MG/5ML PO SUSP
30.0000 mL | Freq: Once | ORAL | Status: AC
  Administered 2023-04-19: 30 mL via ORAL
  Filled 2023-04-19: qty 30

## 2023-04-19 MED ORDER — DEXTROSE-SODIUM CHLORIDE 5-0.45 % IV SOLN: INTRAVENOUS | Status: DC

## 2023-04-19 MED ORDER — ONDANSETRON 4 MG PO TBDP
4.0000 mg | ORAL_TABLET | Freq: Once | ORAL | Status: AC | PRN
  Administered 2023-04-19: 4 mg via ORAL
  Filled 2023-04-19: qty 1

## 2023-04-19 MED ORDER — LACTATED RINGERS IV BOLUS
1000.0000 mL | Freq: Once | INTRAVENOUS | Status: AC
  Administered 2023-04-19: 1000 mL via INTRAVENOUS

## 2023-04-19 MED ORDER — LACTATED RINGERS IV SOLN: INTRAVENOUS | Status: DC

## 2023-04-19 MED ORDER — PANTOPRAZOLE SODIUM 40 MG PO TBEC
40.0000 mg | DELAYED_RELEASE_TABLET | Freq: Two times a day (BID) | ORAL | Status: DC
  Administered 2023-04-19 – 2023-04-20 (×2): 40 mg via ORAL
  Filled 2023-04-19 (×4): qty 1

## 2023-04-19 MED ORDER — POTASSIUM CHLORIDE CRYS ER 20 MEQ PO TBCR
40.0000 meq | EXTENDED_RELEASE_TABLET | Freq: Once | ORAL | Status: AC
  Administered 2023-04-19: 40 meq via ORAL
  Filled 2023-04-19: qty 2

## 2023-04-19 MED ORDER — ENOXAPARIN SODIUM 40 MG/0.4ML IJ SOSY
40.0000 mg | PREFILLED_SYRINGE | INTRAMUSCULAR | Status: DC
  Administered 2023-04-20: 40 mg via SUBCUTANEOUS
  Filled 2023-04-19 (×2): qty 0.4

## 2023-04-19 MED ORDER — SERTRALINE HCL 50 MG PO TABS
100.0000 mg | ORAL_TABLET | Freq: Every day | ORAL | Status: DC
  Administered 2023-04-20: 100 mg via ORAL
  Filled 2023-04-19: qty 4
  Filled 2023-04-19: qty 2

## 2023-04-19 MED ORDER — ONDANSETRON 4 MG PO TBDP
8.0000 mg | ORAL_TABLET | Freq: Three times a day (TID) | ORAL | Status: DC
  Administered 2023-04-19 – 2023-04-21 (×5): 8 mg via ORAL
  Filled 2023-04-19 (×5): qty 2

## 2023-04-19 NOTE — ED Notes (Signed)
[facility] at bedside to transport pt to [place] 

## 2023-04-19 NOTE — H&P (Signed)
History and Physical    Antonise Lewin JXB:[id number] DOB: [birth year] DOA: 04/19/2023  PCP: Mila Palmer, MD   Chief Complaint: n/v/d  HPI: Deborah Juarez is a 32 y.o. female with medical history significant of anxiety, asthma, GERD who presents emergency department due to intractable nausea from diarrhea.  Patient states symptoms started approximately 3 days ago.  She was unable to take food down with up to 7 episodes of vomiting per day and more than 3 episodes of diarrhea per day.  She felt dehydrated so she presented to the ER for further assessment.  On arrival to the emergency department she was afebrile and hemodynamically stable.  Labs were obtained which revealed sodium 139, bicarb 19, creatinine 0.78, AST 812, ALT 63, anion gap 28, hemoglobin 12.5, urinalysis negative for infection.  RVP negative.  UDS positive for benzos and THC.  Abdominal x-ray was unrevealing.  Patient was admitted due to concern for dehydration.  Of note patient has similar presentation in February.  She was treated for acute gastroenteritis with IV fluids and antiemetics.  She was eventually discharged with outpatient follow-up.  Of note upon review of labs patient has had persistently elevated LFTs now for the last 3 months.  No prior workup in chart   Review of Systems: Review of Systems  All other systems reviewed and are negative.    As per HPI otherwise 10 point review of systems negative.   No Known Allergies  Past Medical History:  Diagnosis Date   Anxiety    Anxiety    Asthma    "long " time since used rescue inhaler   GERD (gastroesophageal reflux disease)    Post partum depression     Past Surgical History:  Procedure Laterality Date   BREAST SURGERY     CESAREAN SECTION N/A 03/03/2015   Procedure: CESAREAN SECTION;  Surgeon: Myna Hidalgo, DO;  Location: [facility];  Service: Obstetrics;  Laterality: N/A;     reports that she has never smoked. She has never used  smokeless tobacco. She reports that she does not currently use alcohol after a past usage of about 3.0 standard drinks of alcohol per week. She reports that she does not use drugs.  Family History  Problem Relation Age of Onset   Cancer Father        Pacreatic Ca   Heart disease Father    Heart disease Maternal Grandfather    Heart disease Paternal Grandfather    Stroke Paternal Grandfather     Prior to Admission medications   Medication Sig Start Date End Date Taking? Authorizing Provider  acetaminophen (TYLENOL) 325 MG tablet Take 2 tablets (650 mg total) by mouth every 4 (four) hours as needed (for pain scale < 4). Patient not taking: Reported on 12/26/2022 09/06/22   Gerald Leitz, MD  ferrous sulfate 325 (65 FE) MG tablet Take 1 tablet (325 mg total) by mouth daily with breakfast. Patient not taking: Reported on 12/26/2022 09/06/22   Gerald Leitz, MD  ibuprofen (ADVIL) 200 MG tablet Take 400 mg by mouth daily as needed for headache, mild pain or moderate pain.    [provider]  ibuprofen (ADVIL) 600 MG tablet Take 1 tablet (600 mg total) by mouth every 6 (six) hours as needed. Patient not taking: Reported on 12/26/2022 09/06/22   Gerald Leitz, MD  NIFEdipine (ADALAT CC) 30 MG 24 hr tablet Take 1 tablet (30 mg total) by mouth daily. Patient not taking: Reported on 12/26/2022 09/07/22  Gerald Leitz, MD  pantoprazole (PROTONIX) 40 MG tablet Take 1 tablet (40 mg total) by mouth daily. Patient not taking: Reported on 12/26/2022 09/07/22   Gerald Leitz, MD  pseudoephedrine (SUDAFED) 30 MG tablet Take 2 tablets (60 mg total) by mouth every 6 (six) hours as needed for congestion. Patient taking differently: Take 30 mg by mouth daily as needed for congestion. 05/13/22   Gerrit Heck, CNM  sertraline (ZOLOFT) 50 MG tablet Take 50 mg by mouth daily.    [provider]    Physical Exam: Vitals:   04/19/23 1818 04/19/23 1845 04/19/23 2159 04/19/23 2257  BP: 129/84 103/80  122/85  Pulse: 86  92  75  Resp: 18 18  18   Temp: 98.7 F (37.1 C)  98.1 F (36.7 C) 98.6 F (37 C)  TempSrc: Oral  Oral Oral  SpO2: 100% 99%  100%   Physical Exam Vitals reviewed.  Constitutional:      Appearance: She is normal weight.  HENT:     Head: Normocephalic.  Cardiovascular:     Rate and Rhythm: Normal rate and regular rhythm.     Heart sounds: Normal heart sounds.  Pulmonary:     Effort: Pulmonary effort is normal.     Breath sounds: Normal breath sounds.  Abdominal:     General: Abdomen is flat.     Palpations: Abdomen is soft.  Skin:    General: Skin is warm.     Capillary Refill: Capillary refill takes less than 2 seconds.  Neurological:     General: No focal deficit present.     Mental Status: She is alert and oriented to person, place, and time.       Labs on Admission: I have personally reviewed the patients's labs and imaging studies.  Assessment/Plan Principal Problem:   Intractable nausea and vomiting    # Intractable nausea vomiting, POA, active - Patient presents with 3 days of inability to tolerate p.o. due to persistent nausea vomiting and diarrhea.  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 - No heavy alcohol usage - No prior serologic workup Plan: Check right upper quadrant ultrasound Check anti-smooth muscle antibody Check immunoglobulins Check ANA Check ceruloplasmin Check iron  panel Check HIV  #Anxiety- continue zoloft  Admission status: Observation Med-Surg  Certification: The appropriate patient status for this patient is OBSERVATION. Observation status is judged to be reasonable and necessary in order to provide the required intensity of service to ensure the patient's safety. The patient's presenting symptoms, physical exam findings, and initial radiographic and laboratory data in the context of their medical condition is felt to place them at decreased risk for further clinical deterioration. Furthermore, it is anticipated that the patient will be medically stable for discharge from the hospital within 2 midnights of admission.     Alan Mulder MD [facility] If 7PM-7AM, please contact night-coverage [email or web address]  04/19/2023, 11:16 PM

## 2023-04-19 NOTE — ED Triage Notes (Addendum)
Pt presents to ED POV. Pt c/o abd pain and n/v/d x3d. Pt reports that pain is stabbing and 4/10, intermittently gets worse. Pt reports that she is unable to keep even water down. Denies fevers, denies sick contacts.

## 2023-04-19 NOTE — ED Provider Notes (Signed)
[place] EMERGENCY DEPARTMENT AT [facility] Provider Note   CSN: [id number] Arrival date & time: 04/19/23  1320     History  Chief Complaint  Patient presents with   Abdominal Pain    Deborah Juarez is a 32 y.o. female.  Patient with history of asthma, anxiety, and GERD presents today with complaints of nausea, vomiting, and diarrhea. She states that same has been ongoing for the past 3 days. She has not been able to keep anything down during this timeframe. She endorses generalized abdominal pain and weakness as well. States that she was admitted in February for similar symptoms due to dehydration. 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 She denies any history of abdominal surgeries. No known sick contacts. No urinary symptoms or vaginal discharge. She does occasionally smoke marijuana, last use was yesterday  The history is provided by the patient. No language interpreter was used.  Abdominal Pain Associated symptoms: diarrhea, nausea and vomiting        Home Medications Prior to Admission medications   Medication Sig Start Date End Date Taking? Authorizing Provider  acetaminophen (TYLENOL) 325 MG tablet Take 2 tablets (650 mg total) by mouth every 4 (four) hours as needed (for pain scale < 4). Patient not taking: Reported on 12/26/2022 09/06/22   Gerald Leitz, MD  ferrous sulfate 325 (65 FE) MG tablet Take 1 tablet (325 mg total) by mouth daily with breakfast. Patient not taking: Reported on 12/26/2022 09/06/22   Gerald Leitz, MD  ibuprofen (ADVIL) 200 MG tablet Take 400 mg by mouth daily as needed for headache, mild pain or moderate pain.    [provider]  ibuprofen (ADVIL) 600 MG tablet Take 1 tablet (600 mg total) by mouth every 6 (six) hours as needed. Patient not taking: Reported on 12/26/2022 09/06/22   Gerald Leitz, MD  NIFEdipine (ADALAT CC) 30 MG 24 hr tablet Take 1 tablet (30 mg total) by mouth daily. Patient not taking: Reported on 12/26/2022 09/07/22   Gerald Leitz, MD  pantoprazole (PROTONIX) 40 MG tablet Take 1 tablet (40 mg total) by mouth daily. Patient not taking: Reported on 12/26/2022 09/07/22   Gerald Leitz, MD  pseudoephedrine (SUDAFED) 30 MG tablet Take 2 tablets (60 mg total) by mouth every 6 (six) hours as needed for congestion. Patient taking differently: Take 30 mg by mouth daily as needed for congestion. 05/13/22   Gerrit Heck, CNM  sertraline (ZOLOFT) 50 MG tablet Take 50 mg by mouth daily.    [provider]      Allergies    Patient has no known allergies.    Review of Systems   Review of Systems  Gastrointestinal:  Positive for abdominal pain, diarrhea, nausea and vomiting.  All other systems reviewed and are negative.   Physical Exam Updated Vital Signs BP 119/76   Pulse 71   Temp 98.1 F (36.7 C) (Oral)   Resp 18   SpO2 100%   Breastfeeding No  Physical Exam Vitals and nursing note reviewed.  Constitutional:      General: She is not in acute distress.    Appearance: Normal appearance. She is normal weight. She is not ill-appearing, toxic-appearing or diaphoretic.  HENT:     Head: Normocephalic and atraumatic.  Cardiovascular:     Rate and Rhythm: Normal rate.  Pulmonary:  Effort: Pulmonary effort is normal. No respiratory distress.  Abdominal:     General: Abdomen is flat.     Palpations: Abdomen is soft.     Tenderness: There is no abdominal tenderness.  Musculoskeletal:        General: Normal range of motion.     Cervical back: Normal range of motion.  Skin:    General: Skin is warm and dry.  Neurological:     General: No focal deficit present.     Mental Status: She is alert.  Psychiatric:        Mood and Affect: Mood normal.        Behavior: Behavior normal.     ED Results / Procedures / Treatments   Labs (all labs ordered are  listed, but only abnormal results are displayed) Labs Reviewed  LIPASE, BLOOD - Abnormal; Notable for the following components:      Result Value   Lipase 79 (*)    All other components within normal limits  COMPREHENSIVE METABOLIC PANEL - Abnormal; Notable for the following components:   Chloride 92 (*)    CO2 19 (*)    Total Protein 8.5 (*)    AST 112 (*)    ALT 63 (*)    Total Bilirubin 1.3 (*)    Anion gap 28 (*)    All other components within normal limits  URINALYSIS, ROUTINE W REFLEX MICROSCOPIC - Abnormal; Notable for the following components:   Hgb urine dipstick MODERATE (*)    Ketones, ur >80 (*)    Protein, ur 30 (*)    Leukocytes,Ua TRACE (*)    All other components within normal limits  RESP PANEL BY RT-PCR (RSV, FLU A&B, COVID)  [id number]  CBC  PREGNANCY, URINE  CBG MONITORING, ED    EKG None  Radiology No results found.  Procedures .Critical Care  Performed by: Silva Bandy, PA-C Authorized by: Silva Bandy, PA-C   Critical care provider statement:    Critical care time (minutes):  30   Critical care start time:  04/19/2023 8:00 PM   Critical care end time:  04/19/2023 8:30 PM   Critical care was necessary to treat or prevent imminent or life-threatening deterioration of the following conditions:  Metabolic crisis and dehydration   Critical care was time spent personally by me on the following activities:  Development of treatment plan with patient or surrogate, discussions with primary provider, evaluation of patient's response to treatment, examination of patient, obtaining history from patient or surrogate, ordering and review of laboratory studies, pulse oximetry and re-evaluation of patient's condition   Care discussed with: admitting provider       Medications Ordered in ED Medications  dextrose 5 % and 0.45 % NaCl infusion (has no administration in time range)  LORazepam (ATIVAN) injection 1 mg (has no administration in time range)   ondansetron (ZOFRAN-ODT) disintegrating tablet 4 mg (4 mg Oral Given 04/19/23 1346)  lactated ringers bolus 1,000 mL (0 mLs Intravenous Stopped 04/19/23 1915)  lactated ringers bolus 1,000 mL (0 mLs Intravenous Stopped 04/19/23 1845)  ondansetron (ZOFRAN-ODT) disintegrating tablet 4 mg (4 mg Oral Given 04/19/23 1910)  potassium chloride SA (KLOR-CON M) CR tablet 40 mEq (40 mEq Oral Given 04/19/23 2037)  alum & mag hydroxide-simeth (MAALOX/MYLANTA) 200-200-20 MG/5ML suspension 30 mL (30 mLs Oral Given 04/19/23 2126)    ED Course/ Medical Decision Making/ A&P  Medical Decision Making Amount and/or Complexity of Data Reviewed Labs: ordered.  Risk OTC drugs. Prescription drug management.   This patient is a 32 y.o. female who presents to the ED for concern of nausea, vomiting, diarrhea, this involves an extensive number of treatment options, and is a complaint that carries with it a high risk of complications and morbidity. The emergent differential diagnosis prior to evaluation includes, but is not limited to, AAA, gastroenteritis, appendicitis, Bowel obstruction, Bowel perforation. Gastroparesis, DKA, Hernia, Inflammatory bowel disease, mesenteric ischemia, pancreatitis, peritonitis SBP, volvulus.  This is not an exhaustive differential.   Past Medical History / Co-morbidities / Social History: history of asthma, anxiety, and GERD  Additional history: Chart reviewed. Pertinent results include: recently admitted for similar symptoms in February when found to have metabolic acidosis  Physical Exam: Physical exam performed. The pertinent findings include: abdomen soft and nontender  Lab Tests: I ordered, and personally interpreted labs.  The pertinent results include: No leukocytosis or anemia. K 4.2 --> 3.3 chloride 92 --> 96 bicarb 19 -->19 Glucose 73 --> 68 anion gap 28 --> 22. AST 112, ALT 63, T bili 1.3. UA with ketones. Likely starvation ketosis   Cardiac  Monitoring:  The patient was maintained on a cardiac monitor.  My attending physician Dr. Adela Lank viewed and interpreted the cardiac monitored which showed an underlying rhythm of: borderline QT prolongation. I agree with this interpretation.   Medications: I ordered medication including lr bolus x 2, zofran, GI cocktail, ativan, oral potassium, D51/2NS  for dehydration, nausea/vomiting, acid reflux, hypokalemia, anxiety. Reevaluation of the patient after these medicines showed that the patient improved. I have reviewed the patients home medicines and have made adjustments as needed.   Disposition: After consideration of the diagnostic results and the patients response to treatment, I feel that patient will require admission for intractable nausea/vomiting and high anion gap metabolic acidosis likely due to starvation ketosis. After 2 rounds of zofran and ativan, patient is still having intractable nausea and some vomiting as well. Will require admission. Patient is understanding and in agreement.  Discussed patient with hospitalist who agrees to admit.  I discussed this case with my attending physician Dr. Adela Lank who cosigned this note including patient's presenting symptoms, physical exam, and planned diagnostics and interventions. Attending physician stated agreement with plan or made changes to plan which were implemented.     Final Clinical Impression(s) / ED Diagnoses Final diagnoses:  Intractable nausea and vomiting  Increased anion gap metabolic acidosis  Dehydration    Rx / DC Orders ED Discharge Orders     None         Vear Clock 04/19/23 2148    Melene Plan, DO 04/19/23 2151

## 2023-04-19 NOTE — Progress Notes (Addendum)
This is a 32 year old female with past medical history significant for GERD, anxiety and asthma.  She presents with complaint of intractable nausea, vomiting, diarrhea, mild abdominal pain generalized, and weakness for the past 3 days.  There is no reports of fevers, chills, or burning urination.  In the ER patient has been given 2 L normal saline bolus.  This improved her anion gap from 28-22.  She was given Zofran IV x 2 and Ativan once.  Patient remains nauseous.  Per EDP abdomen is benign therefore CT abdomen pelvis not done.  Overnight observation requested. Abdominal x-ray added and UDS added.

## 2023-04-19 NOTE — ED Notes (Signed)
Called [facility] to transport patient to [facility] Rm# [phone]

## 2023-04-20 ENCOUNTER — Observation Stay (HOSPITAL_COMMUNITY): Payer: Medicaid Other

## 2023-04-20 DIAGNOSIS — F419 Anxiety disorder, unspecified: Secondary | ICD-10-CM | POA: Diagnosis present

## 2023-04-20 DIAGNOSIS — Z79899 Other long term (current) drug therapy: Secondary | ICD-10-CM | POA: Diagnosis not present

## 2023-04-20 DIAGNOSIS — J45909 Unspecified asthma, uncomplicated: Secondary | ICD-10-CM | POA: Diagnosis present

## 2023-04-20 DIAGNOSIS — R7989 Other specified abnormal findings of blood chemistry: Secondary | ICD-10-CM | POA: Diagnosis present

## 2023-04-20 DIAGNOSIS — E860 Dehydration: Secondary | ICD-10-CM | POA: Diagnosis present

## 2023-04-20 DIAGNOSIS — R112 Nausea with vomiting, unspecified: Secondary | ICD-10-CM | POA: Diagnosis not present

## 2023-04-20 DIAGNOSIS — E8721 Acute metabolic acidosis: Secondary | ICD-10-CM | POA: Diagnosis present

## 2023-04-20 DIAGNOSIS — K219 Gastro-esophageal reflux disease without esophagitis: Secondary | ICD-10-CM | POA: Diagnosis present

## 2023-04-20 DIAGNOSIS — K529 Noninfective gastroenteritis and colitis, unspecified: Secondary | ICD-10-CM | POA: Diagnosis not present

## 2023-04-20 DIAGNOSIS — Z823 Family history of stroke: Secondary | ICD-10-CM | POA: Diagnosis not present

## 2023-04-20 DIAGNOSIS — E876 Hypokalemia: Secondary | ICD-10-CM | POA: Diagnosis present

## 2023-04-20 DIAGNOSIS — K760 Fatty (change of) liver, not elsewhere classified: Secondary | ICD-10-CM | POA: Diagnosis present

## 2023-04-20 DIAGNOSIS — E871 Hypo-osmolality and hyponatremia: Secondary | ICD-10-CM | POA: Diagnosis present

## 2023-04-20 DIAGNOSIS — K8020 Calculus of gallbladder without cholecystitis without obstruction: Secondary | ICD-10-CM | POA: Diagnosis present

## 2023-04-20 DIAGNOSIS — Z1152 Encounter for screening for COVID-19: Secondary | ICD-10-CM | POA: Diagnosis not present

## 2023-04-20 DIAGNOSIS — Z800 Family history of malignant neoplasm of digestive organs: Secondary | ICD-10-CM | POA: Diagnosis not present

## 2023-04-20 DIAGNOSIS — Z8249 Family history of ischemic heart disease and other diseases of the circulatory system: Secondary | ICD-10-CM | POA: Diagnosis not present

## 2023-04-20 LAB — HEPATIC FUNCTION PANEL
ALT: 48 U/L — ABNORMAL HIGH (ref 0–44)
AST: 84 U/L — ABNORMAL HIGH (ref 15–41)
Albumin: 3.6 g/dL (ref 3.5–5.0)
Alkaline Phosphatase: 56 U/L (ref 38–126)
Bilirubin, Direct: 0.2 mg/dL (ref 0.0–0.2)
Indirect Bilirubin: 1.5 mg/dL — ABNORMAL HIGH (ref 0.3–0.9)
Total Bilirubin: 1.7 mg/dL — ABNORMAL HIGH (ref 0.3–1.2)
Total Protein: 6.8 g/dL (ref 6.5–8.1)

## 2023-04-20 LAB — IRON AND TIBC
Iron: 147 ug/dL (ref 28–170)
Saturation Ratios: 51 % — ABNORMAL HIGH (ref 10.4–31.8)
TIBC: 288 ug/dL (ref 250–450)
UIBC: 141 ug/dL

## 2023-04-20 LAB — GASTROINTESTINAL PANEL BY PCR, STOOL (REPLACES STOOL CULTURE)

## 2023-04-20 LAB — HIV ANTIBODY (ROUTINE TESTING W REFLEX): HIV Screen 4th Generation wRfx: NONREACTIVE

## 2023-04-20 LAB — BASIC METABOLIC PANEL
Anion gap: 11 (ref 5–15)
BUN: 6 mg/dL (ref 6–20)
CO2: 24 mmol/L (ref 22–32)
Calcium: 8.1 mg/dL — ABNORMAL LOW (ref 8.9–10.3)
Chloride: 98 mmol/L (ref 98–111)
Creatinine, Ser: 0.7 mg/dL (ref 0.44–1.00)
GFR, Estimated: >60 mL/min (ref >60)
Glucose, Bld: 98 mg/dL (ref 70–99)
Potassium: 4.1 mmol/L (ref 3.5–5.1)
Sodium: 133 mmol/L — ABNORMAL LOW (ref 135–145)

## 2023-04-20 LAB — MAGNESIUM: Magnesium: 1.9 mg/dL (ref 1.7–2.4)

## 2023-04-20 MED ORDER — DEXTROSE-SODIUM CHLORIDE 5-0.9 % IV SOLN: INTRAVENOUS | Status: DC

## 2023-04-20 NOTE — TOC Progression Note (Signed)
Transition of Care [facility]) - Progression Note    Patient Details  Name: Deborah Juarez MRN: [id number] Date of Birth: Dec 13, 1990  Transition of Care [facility]) CM/SW Contact  Beckie Busing, RN Phone [phone]  04/20/2023, 3:49 PM  Clinical Narrative:    Transition of Care [facility]) - Inpatient Brief Assessment   Patient Details  Name: Deborah Juarez MRN: [id number] Date of Birth: 1991-10-15  Transition of Care [facility]) CM/SW Contact:    Beckie Busing, RN Phone Number: 04/20/2023, 3:51 PM   Clinical Narrative: Assessment completed no TOC needs noted at this time.    Transition of Care Asessment: Insurance and Status: Insurance coverage has been reviewed Patient has primary care physician: Yes Home environment has been reviewed: Yes from home Prior level of function:: Independent Prior/Current Home Services: No current home services Social Determinants of Health Reivew: SDOH reviewed no interventions necessary Readmission risk has been reviewed: Yes Transition of care needs: no transition of care needs at this time         Expected Discharge Plan and Services                                               Social Determinants of Health (SDOH) Interventions SDOH Screenings   Food Insecurity: No Food Insecurity (04/19/2023)  Housing: Low Risk  (04/19/2023)  Transportation Needs: No Transportation Needs (04/19/2023)  Utilities: Not At Risk (04/19/2023)  Tobacco Use: Low Risk  (04/19/2023)    Readmission Risk Interventions     No data to display

## 2023-04-20 NOTE — Progress Notes (Signed)
PROGRESS NOTE    Deborah Juarez  WUJ:[id number] DOB: 04-30-1991 DOA: 04/19/2023 PCP: Mila Palmer, MD   Brief Narrative:  32 y.o. female with medical history significant of anxiety, asthma, GERD presented with intractable nausea, vomiting and diarrhea.  On presentation, lipase was 79, AST of 112, ALT of 63, total bili of 1.3; urine drug screen was positive for tetrahydrocannabinol and benzodiazepines.  Abdominal x-ray was unrevealing.  She was started on IV fluids and antiemetics.  Assessment & Plan:   Intractable nausea, vomiting and diarrhea/probable acute gastroenteritis -Cyclical vomiting syndrome because of marijuana use can also be on differential -Vomiting has improved.  Still having diarrhea.  Stool GI panel PCR pending. -Continue IV fluids.  Advance diet to soft diet today. -Continue antiemetics as needed.  Currently on PPI twice daily  Elevated LFTs Hepatic steatosis Cholelithiasis without cholecystitis -LFTs mildly elevated for several months.  LFTs pending for today.  Repeat a.m. LFTs. -Might need outpatient GI evaluation. -Might need outpatient general surgery evaluation if patient becomes symptomatic from gallstones.  Hypokalemia -Resolved  Hyponatremia -Mild.  Continue IV fluids.  Monitor  Acute metabolic acidosis -improved  Anxiety -Continue Zoloft   DVT prophylaxis: Early ambulation Code Status: Full Family Communication: Husband at bedside Disposition Plan: Status is: Observation The patient will require care spanning > 2 midnights and should be moved to inpatient because: Of severity of illness.  Need for more IV fluids  Consultants: None  Procedures: None  Antimicrobials: None   Subjective: Patient seen and examined at bedside.  Has not vomited since admission.  Still having watery diarrhea.  Tolerating clear liquid diet.  No fever, chest pain or shortness of breath reported.  Objective: Vitals:   04/19/23 2159 04/19/23 2257  04/20/23 0408 04/20/23 0722  BP:  122/85 113/73 118/79  Pulse:  75 88 82  Resp:  18 18 18   Temp: 98.1 F (36.7 C) 98.6 F (37 C) 98.6 F (37 C) 98.3 F (36.8 C)  TempSrc: Oral Oral Oral Oral  SpO2:  100% 99% 99%    Intake/Output Summary (Last 24 hours) at 04/20/2023 1013 Last data filed at 04/20/2023 0514 Gross per 24 hour  Intake 2767.79 ml  Output --  Net 2767.79 ml   There were no vitals filed for this visit.  Examination:  General exam: Appears calm and comfortable.  On room air. Respiratory system: Bilateral decreased breath sounds at bases, no wheezing Cardiovascular system: S1 & S2 heard, Rate controlled Gastrointestinal system: Abdomen is nondistended, soft and mildly tender. Normal bowel sounds heard. Extremities: No cyanosis, clubbing, edema  Central nervous system: Alert and oriented. No focal neurological deficits. Moving extremities Skin: No rashes, lesions or ulcers Psychiatry: Judgement and insight appear normal. Mood & affect appropriate.     Data Reviewed: I have personally reviewed following labs and imaging studies  CBC: Recent Labs  Lab 04/19/23 1343  WBC 8.2  HGB 12.5  HCT 38.2  MCV 95.5  PLT 212   Basic Metabolic Panel: Recent Labs  Lab 04/19/23 1343 04/19/23 1810 04/20/23 0721  NA 139 137 133*  K 4.2 3.3* 4.1  CL 92* 96* 98  CO2 19* 19* 24  GLUCOSE 73 68* 98  BUN 8 6 6   CREATININE 0.78 0.64 0.70  CALCIUM 9.6 8.3* 8.1*  MG  --   --  1.9   GFR: CrCl cannot be calculated (Unknown ideal weight.). Liver Function Tests: Recent Labs  Lab 04/19/23 1343  AST 112*  ALT 63*  ALKPHOS 67  BILITOT 1.3*  PROT 8.5*  ALBUMIN 5.0   Recent Labs  Lab 04/19/23 1343  LIPASE 79*   No results for input(s): "AMMONIA" in the last 168 hours. Coagulation Profile: No results for input(s): "INR", "PROTIME" in the last 168 hours. Cardiac Enzymes: No results for input(s): "CKTOTAL", "CKMB", "CKMBINDEX", "TROPONINI" in the last 168 hours. BNP  (last 3 results) No results for input(s): "PROBNP" in the last 8760 hours. HbA1C: No results for input(s): "HGBA1C" in the last 72 hours. CBG: Recent Labs  Lab 04/19/23 1345  GLUCAP 74   Lipid Profile: No results for input(s): "CHOL", "HDL", "LDLCALC", "TRIG", "CHOLHDL", "LDLDIRECT" in the last 72 hours. Thyroid Function Tests: No results for input(s): "TSH", "T4TOTAL", "FREET4", "T3FREE", "THYROIDAB" in the last 72 hours. Anemia Panel: No results for input(s): "VITAMINB12", "FOLATE", "FERRITIN", "TIBC", "IRON", "RETICCTPCT" in the last 72 hours. Sepsis Labs: No results for input(s): "PROCALCITON", "LATICACIDVEN" in the last 168 hours.  Recent Results (from the past 240 hour(s))  Resp panel by RT-PCR (RSV, Flu A&B, Covid) Urine, Clean Catch     Status: None   Collection Time: 04/19/23  1:43 PM   Specimen: Urine, Clean Catch; Nasal Swab  Result Value Ref Range Status   SARS Coronavirus 2 by RT PCR NEGATIVE NEGATIVE Final    Comment: (NOTE) SARS-CoV-2 target nucleic acids are NOT DETECTED.  The SARS-CoV-2 RNA is generally detectable in upper respiratory specimens during the acute phase of infection. The lowest concentration of SARS-CoV-2 viral copies this assay can detect is 138 copies/mL. A negative result does not preclude SARS-Cov-2 infection and should not be used as the sole basis for treatment or other patient management decisions. A negative result may occur with  improper specimen collection/handling, submission of specimen other than nasopharyngeal swab, presence of viral mutation(s) within the areas targeted by this assay, and inadequate number of viral copies(<138 copies/mL). A negative result must be combined with clinical observations, patient history, and epidemiological information. The expected result is Negative.  Fact Sheet for Patients:  BloggerCourse.com  Fact Sheet for Healthcare Providers:   SeriousBroker.it  This test is no t yet approved or cleared by the [place] FDA and  has been authorized for detection and/or diagnosis of SARS-CoV-2 by FDA under an Emergency Use Authorization (EUA). This EUA will remain  in effect (meaning this test can be used) for the duration of the COVID-19 declaration under Section 564(b)(1) of the Act, 21 U.S.C.section 360bbb-3(b)(1), unless the authorization is terminated  or revoked sooner.       Influenza A by PCR NEGATIVE NEGATIVE Final   Influenza B by PCR NEGATIVE NEGATIVE Final    Comment: (NOTE) The Xpert Xpress SARS-CoV-2/FLU/RSV plus assay is intended as an aid in the diagnosis of influenza from Nasopharyngeal swab specimens and should not be used as a sole basis for treatment. Nasal washings and aspirates are unacceptable for Xpert Xpress SARS-CoV-2/FLU/RSV testing.  Fact Sheet for Patients: BloggerCourse.com  Fact Sheet for Healthcare Providers: SeriousBroker.it  This test is not yet approved or cleared by the [place] FDA and has been authorized for detection and/or diagnosis of SARS-CoV-2 by FDA under an Emergency Use Authorization (EUA). This EUA will remain in effect (meaning this test can be used) for the duration of the COVID-19 declaration under Section 564(b)(1) of the Act, 21 U.S.C. section 360bbb-3(b)(1), unless the authorization is terminated or revoked.     Resp Syncytial Virus by PCR NEGATIVE NEGATIVE Final    Comment: (NOTE) Fact Sheet for Patients:  BloggerCourse.com  Fact Sheet for Healthcare Providers: SeriousBroker.it  This test is not yet approved or cleared by the [place] FDA and has been authorized for detection and/or diagnosis of SARS-CoV-2 by FDA under an Emergency Use Authorization (EUA). This EUA will remain in effect (meaning this test can be used) for  the duration of the COVID-19 declaration under Section 564(b)(1) of the Act, 21 U.S.C. section 360bbb-3(b)(1), unless the authorization is terminated or revoked.  Performed at [facility], [address] [place], [place] [address]          Radiology Studies: US Abdomen Limited RUQ (LIVER/GB)  Result Date: 04/20/2023 CLINICAL DATA:  Elevated liver enzymes. EXAM: ULTRASOUND ABDOMEN LIMITED RIGHT UPPER QUADRANT COMPARISON:  None Available. FINDINGS: Gallbladder: There is no wall thickening, pericholecystic fluid or positive sonographic Murphy's sign improved there are clustered stones in the gallbladder fundus, largest 6 mm. Common bile duct: Diameter: 3.1 mm, with no intrahepatic biliary prominence. Liver: No focal lesion identified. The liver length is 17 cm. There is diffuse increased parenchymal echogenicity consistent with steatosis. Portal vein is patent on color Doppler imaging with normal direction of blood flow towards the liver. Other: None. IMPRESSION: 1. Cholelithiasis without sonographic findings of acute cholecystitis. 2. Hepatic steatosis. Electronically Signed   By: Almira Bar M.D.   On: 04/20/2023 02:27   DG Abd 2 Views  Result Date: 04/19/2023 CLINICAL DATA:  Gastroenteritis diarrhea vomiting EXAM: ABDOMEN - 2 VIEW COMPARISON:  None Available. FINDINGS: The bowel gas pattern is normal. There is no evidence of free air. No radio-opaque calculi or other significant radiographic abnormality is seen. Phleboliths in the pelvis. IMPRESSION: Negative. Electronically Signed   By: Jasmine Pang M.D.   On: 04/19/2023 22:07        Scheduled Meds:  enoxaparin (LOVENOX) injection  40 mg Subcutaneous Q24H   ondansetron  8 mg Oral Q8H   pantoprazole  40 mg Oral BID   sertraline  100 mg Oral Daily   Continuous Infusions:  dextrose 5 % and 0.45 % NaCl with KCl 20 mEq/L 100 mL/hr at 04/20/23 0837          Glade Lloyd, MD [facility] 04/20/2023, 10:13 AM

## 2023-04-21 DIAGNOSIS — R112 Nausea with vomiting, unspecified: Secondary | ICD-10-CM | POA: Diagnosis not present

## 2023-04-21 LAB — COMPREHENSIVE METABOLIC PANEL
ALT: 53 U/L — ABNORMAL HIGH (ref 0–44)
AST: 124 U/L — ABNORMAL HIGH (ref 15–41)
Albumin: 3.4 g/dL — ABNORMAL LOW (ref 3.5–5.0)
Alkaline Phosphatase: 56 U/L (ref 38–126)
Anion gap: 7 (ref 5–15)
BUN: <5 mg/dL — ABNORMAL LOW (ref 6–20)
CO2: 28 mmol/L (ref 22–32)
Calcium: 7.9 mg/dL — ABNORMAL LOW (ref 8.9–10.3)
Chloride: 100 mmol/L (ref 98–111)
Creatinine, Ser: 0.63 mg/dL (ref 0.44–1.00)
GFR, Estimated: >60 mL/min (ref >60)
Glucose, Bld: 131 mg/dL — ABNORMAL HIGH (ref 70–99)
Potassium: 3.6 mmol/L (ref 3.5–5.1)
Sodium: 135 mmol/L (ref 135–145)
Total Bilirubin: 0.9 mg/dL (ref 0.3–1.2)
Total Protein: 6.5 g/dL (ref 6.5–8.1)

## 2023-04-21 LAB — MAGNESIUM: Magnesium: 1.7 mg/dL (ref 1.7–2.4)

## 2023-04-21 MED ORDER — PANTOPRAZOLE SODIUM 40 MG PO TBEC: 40.0000 mg | DELAYED_RELEASE_TABLET | Freq: Every day | ORAL | 0 refills | Status: DC

## 2023-04-21 MED ORDER — HYDROXYZINE HCL 25 MG PO TABS
25.0000 mg | ORAL_TABLET | Freq: Once | ORAL | Status: AC
  Administered 2023-04-21: 25 mg via ORAL
  Filled 2023-04-21: qty 1

## 2023-04-21 MED ORDER — LOPERAMIDE HCL 2 MG PO CAPS: 2.0000 mg | ORAL_CAPSULE | Freq: Four times a day (QID) | ORAL | 0 refills | Status: DC | PRN

## 2023-04-21 MED ORDER — LOPERAMIDE HCL 2 MG PO CAPS: 2.0000 mg | ORAL_CAPSULE | Freq: Four times a day (QID) | ORAL | Status: DC | PRN

## 2023-04-21 NOTE — Plan of Care (Signed)
Patient is stable for discharge, discharge instructions have been given. All questions answered, patient is discharged to home with family/friend.  

## 2023-04-21 NOTE — Discharge Summary (Signed)
Physician Discharge Summary  Deborah Juarez [place] ZOX:[id number] DOB: [birth year] DOA: 04/19/2023  PCP: Mila Palmer, MD  Admit date: 04/19/2023 Discharge date: 04/21/2023  Admitted From: Home Disposition: Home  Recommendations for Outpatient Follow-up:  Follow up with PCP in 1 week with repeat CBC/CMP Follow up in ED if symptoms worsen or new appear   Home Health: No Equipment/Devices: None  Discharge Condition: Stable CODE STATUS: Full Diet recommendation: Soft diet as tolerated  Brief/Interim Summary: 32 y.o. female with medical history significant of anxiety, asthma, GERD presented with intractable nausea, vomiting and diarrhea.  On presentation, lipase was 79, AST of 112, ALT of 63, total bili of 1.3; urine drug screen was positive for tetrahydrocannabinol and benzodiazepines.  Abdominal x-ray was unrevealing.  She was started on IV fluids and antiemetics.  Her condition has improved.  She is tolerating diet and her diarrhea is improving as well.  Feels okay to go home today.  Discharge patient home today with outpatient follow-up with PCP.  Discharge Diagnoses:   Intractable nausea, vomiting and diarrhea/probable acute gastroenteritis -Cyclical vomiting syndrome because of marijuana use can also be on differential. -Vomiting has improved.  Diarrhea improving as well.  Stool GI panel PCR negative. -Treated with IV fluids.  Tolerating soft diet. - Feels okay to go home today.  Discharge patient home today with outpatient follow-up with PCP.  Continue PPI once a day on discharge.   Elevated LFTs Hepatic steatosis Cholelithiasis without cholecystitis -LFTs mildly elevated for several months.  LFTs still slightly elevated.  Patient follow-up of LFTs. -Might need outpatient GI evaluation. -Might need outpatient general surgery evaluation if patient becomes symptomatic from gallstones.   Hypokalemia -Resolved   Hyponatremia -Resolved  Acute metabolic acidosis -improved    Anxiety -Continue Zoloft  Discharge Instructions  Discharge Instructions     Diet general   Complete by: As directed    Increase activity slowly   Complete by: As directed       Allergies as of 04/21/2023   No Known Allergies      Medication List     TAKE these medications    BC HEADACHE POWDER PO Take 1 Package by mouth 2 (two) times daily as needed (Pain).   hydrOXYzine 50 MG capsule Commonly known as: VISTARIL Take 50 mg by mouth every 8 (eight) hours as needed for anxiety.   loperamide 2 MG capsule Commonly known as: IMODIUM Take 1 capsule (2 mg total) by mouth every 6 (six) hours as needed for diarrhea or loose stools.   pantoprazole 40 MG tablet Commonly known as: PROTONIX Take 1 tablet (40 mg total) by mouth daily.   sertraline 100 MG tablet Commonly known as: ZOLOFT Take 100 mg by mouth daily.        Follow-up Information     Mila Palmer, MD. Schedule an appointment as soon as possible for a visit in 1 week(s).   Specialty: Family Medicine Why: with repeat cmp Contact information: [address] [place] [address] [phone]                No Known Allergies  Consultations: None   Procedures/Studies: US Abdomen Limited RUQ (LIVER/GB)  Result Date: 04/20/2023 CLINICAL DATA:  Elevated liver enzymes. EXAM: ULTRASOUND ABDOMEN LIMITED RIGHT UPPER QUADRANT COMPARISON:  None Available. FINDINGS: Gallbladder: There is no wall thickening, pericholecystic fluid or positive sonographic Murphy's sign improved there are clustered stones in the gallbladder fundus, largest 6 mm. Common bile duct: Diameter: 3.1 mm, with  no intrahepatic biliary prominence. Liver: No focal lesion identified. The liver length is 17 cm. There is diffuse increased parenchymal echogenicity consistent with steatosis. Portal vein is patent on color Doppler imaging with normal direction of blood flow towards the liver. Other: None. IMPRESSION: 1.  Cholelithiasis without sonographic findings of acute cholecystitis. 2. Hepatic steatosis. Electronically Signed   By: Almira Bar M.D.   On: 04/20/2023 02:27   DG Abd 2 Views  Result Date: 04/19/2023 CLINICAL DATA:  Gastroenteritis diarrhea vomiting EXAM: ABDOMEN - 2 VIEW COMPARISON:  None Available. FINDINGS: The bowel gas pattern is normal. There is no evidence of free air. No radio-opaque calculi or other significant radiographic abnormality is seen. Phleboliths in the pelvis. IMPRESSION: Negative. Electronically Signed   By: Jasmine Pang M.D.   On: 04/19/2023 22:07      Subjective: Patient seen and examined at bedside.  Feels better.  Tolerating diet.  Feels okay to go today.  Diarrhea is also much improved.  Denies any vomiting currently Discharge Exam: Vitals:   04/20/23 2036 04/21/23 0540  BP: (!) 135/101 114/83  Pulse: 88 81  Resp: 16 18  Temp: 98.8 F (37.1 C) 98.6 F (37 C)  SpO2: 100% 99%    General: Pt is alert, awake, not in acute distress Cardiovascular: rate controlled, S1/S2 + Respiratory: bilateral decreased breath sounds at bases Abdominal: Soft, NT, ND, bowel sounds + Extremities: no edema, no cyanosis    The results of significant diagnostics from this hospitalization (including imaging, microbiology, ancillary and laboratory) are listed below for reference.     Microbiology: Recent Results (from the past 240 hour(s))  Gastrointestinal Panel by PCR , Stool     Status: None   Collection Time: 04/19/23  6:54 AM   Specimen: Stool  Result Value Ref Range Status   Campylobacter species NOT DETECTED NOT DETECTED Final   Plesimonas shigelloides NOT DETECTED NOT DETECTED Final   Salmonella species NOT DETECTED NOT DETECTED Final   Yersinia enterocolitica NOT DETECTED NOT DETECTED Final   Vibrio species NOT DETECTED NOT DETECTED Final   Vibrio cholerae NOT DETECTED NOT DETECTED Final   Enteroaggregative E coli (EAEC) NOT DETECTED NOT DETECTED Final    Enteropathogenic E coli (EPEC) NOT DETECTED NOT DETECTED Final   Enterotoxigenic E coli (ETEC) NOT DETECTED NOT DETECTED Final   Shiga like toxin producing E coli (STEC) NOT DETECTED NOT DETECTED Final   Shigella/Enteroinvasive E coli (EIEC) NOT DETECTED NOT DETECTED Final   Cryptosporidium NOT DETECTED NOT DETECTED Final   Cyclospora cayetanensis NOT DETECTED NOT DETECTED Final   Entamoeba histolytica NOT DETECTED NOT DETECTED Final   Giardia lamblia NOT DETECTED NOT DETECTED Final   Adenovirus F40/41 NOT DETECTED NOT DETECTED Final   Astrovirus NOT DETECTED NOT DETECTED Final   Norovirus GI/GII NOT DETECTED NOT DETECTED Final   Rotavirus A NOT DETECTED NOT DETECTED Final   Sapovirus (I, II, IV, and V) NOT DETECTED NOT DETECTED Final    Comment: Performed at [facility], [address] [place], [place] [address]  Resp panel by RT-PCR (RSV, Flu A&B, Covid) Urine, Clean Catch     Status: None   Collection Time: 04/19/23  1:43 PM   Specimen: Urine, Clean Catch; Nasal Swab  Result Value Ref Range Status   SARS Coronavirus 2 by RT PCR NEGATIVE NEGATIVE Final    Comment: (NOTE) SARS-CoV-2 target nucleic acids are NOT DETECTED.  The SARS-CoV-2 RNA is generally detectable in upper respiratory specimens during the acute  phase of infection. The lowest concentration of SARS-CoV-2 viral copies this assay can detect is 138 copies/mL. A negative result does not preclude SARS-Cov-2 infection and should not be used as the sole basis for treatment or other patient management decisions. A negative result may occur with  improper specimen collection/handling, submission of specimen other than nasopharyngeal swab, presence of viral mutation(s) within the areas targeted by this assay, and inadequate number of viral copies(<138 copies/mL). A negative result must be combined with clinical observations, patient history, and epidemiological information. The expected result is Negative.  Fact  Sheet for Patients:  BloggerCourse.com  Fact Sheet for Healthcare Providers:  SeriousBroker.it  This test is no t yet approved or cleared by the [place] FDA and  has been authorized for detection and/or diagnosis of SARS-CoV-2 by FDA under an Emergency Use Authorization (EUA). This EUA will remain  in effect (meaning this test can be used) for the duration of the COVID-19 declaration under Section 564(b)(1) of the Act, 21 U.S.C.section 360bbb-3(b)(1), unless the authorization is terminated  or revoked sooner.       Influenza A by PCR NEGATIVE NEGATIVE Final   Influenza B by PCR NEGATIVE NEGATIVE Final    Comment: (NOTE) The Xpert Xpress SARS-CoV-2/FLU/RSV plus assay is intended as an aid in the diagnosis of influenza from Nasopharyngeal swab specimens and should not be used as a sole basis for treatment. Nasal washings and aspirates are unacceptable for Xpert Xpress SARS-CoV-2/FLU/RSV testing.  Fact Sheet for Patients: BloggerCourse.com  Fact Sheet for Healthcare Providers: SeriousBroker.it  This test is not yet approved or cleared by the [place] FDA and has been authorized for detection and/or diagnosis of SARS-CoV-2 by FDA under an Emergency Use Authorization (EUA). This EUA will remain in effect (meaning this test can be used) for the duration of the COVID-19 declaration under Section 564(b)(1) of the Act, 21 U.S.C. section 360bbb-3(b)(1), unless the authorization is terminated or revoked.     Resp Syncytial Virus by PCR NEGATIVE NEGATIVE Final    Comment: (NOTE) Fact Sheet for Patients: BloggerCourse.com  Fact Sheet for Healthcare Providers: SeriousBroker.it  This test is not yet approved or cleared by the [place] FDA and has been authorized for detection and/or diagnosis of SARS-CoV-2 by FDA under an  Emergency Use Authorization (EUA). This EUA will remain in effect (meaning this test can be used) for the duration of the COVID-19 declaration under Section 564(b)(1) of the Act, 21 U.S.C. section 360bbb-3(b)(1), unless the authorization is terminated or revoked.  Performed at [facility], [address], [place], [place] [address]      Labs: BNP (last 3 results) No results for input(s): "BNP" in the last 8760 hours. Basic Metabolic Panel: Recent Labs  Lab 04/19/23 1343 04/19/23 1810 04/20/23 0721 04/21/23 0718  NA 139 137 133* 135  K 4.2 3.3* 4.1 3.6  CL 92* 96* 98 100  CO2 19* 19* 24 28  GLUCOSE 73 68* 98 131*  BUN 8 6 6  <5*  CREATININE 0.78 0.64 0.70 0.63  CALCIUM 9.6 8.3* 8.1* 7.9*  MG  --   --  1.9 1.7   Liver Function Tests: Recent Labs  Lab 04/19/23 1343 04/20/23 0721 04/21/23 0718  AST 112* 84* 124*  ALT 63* 48* 53*  ALKPHOS 67 56 56  BILITOT 1.3* 1.7* 0.9  PROT 8.5* 6.8 6.5  ALBUMIN 5.0 3.6 3.4*   Recent Labs  Lab 04/19/23 1343  LIPASE 79*   No results for input(s): "AMMONIA"  in the last 168 hours. CBC: Recent Labs  Lab 04/19/23 1343  WBC 8.2  HGB 12.5  HCT 38.2  MCV 95.5  PLT 212   Cardiac Enzymes: No results for input(s): "CKTOTAL", "CKMB", "CKMBINDEX", "TROPONINI" in the last 168 hours. BNP: Invalid input(s): "POCBNP" CBG: Recent Labs  Lab 04/19/23 1345  GLUCAP 74   D-Dimer No results for input(s): "DDIMER" in the last 72 hours. Hgb A1c No results for input(s): "HGBA1C" in the last 72 hours. Lipid Profile No results for input(s): "CHOL", "HDL", "LDLCALC", "TRIG", "CHOLHDL", "LDLDIRECT" in the last 72 hours. Thyroid function studies No results for input(s): "TSH", "T4TOTAL", "T3FREE", "THYROIDAB" in the last 72 hours.  Invalid input(s): "FREET3" Anemia work up Recent Labs    04/20/23 0849  TIBC 288  IRON 147   Urinalysis    Component Value Date/Time   COLORURINE YELLOW 04/19/2023 1343    APPEARANCEUR CLEAR 04/19/2023 1343   LABSPEC 1.025 04/19/2023 1343   PHURINE 6.0 04/19/2023 1343   GLUCOSEU NEGATIVE 04/19/2023 1343   HGBUR MODERATE (A) 04/19/2023 1343   BILIRUBINUR NEGATIVE 04/19/2023 1343   KETONESUR >80 (A) 04/19/2023 1343   PROTEINUR 30 (A) 04/19/2023 1343   UROBILINOGEN 1.0 08/10/2014 1739   NITRITE NEGATIVE 04/19/2023 1343   LEUKOCYTESUR TRACE (A) 04/19/2023 1343   Sepsis Labs Recent Labs  Lab 04/19/23 1343  WBC 8.2   Microbiology Recent Results (from the past 240 hour(s))  Gastrointestinal Panel by PCR , Stool     Status: None   Collection Time: 04/19/23  6:54 AM   Specimen: Stool  Result Value Ref Range Status   Campylobacter species NOT DETECTED NOT DETECTED Final   Plesimonas shigelloides NOT DETECTED NOT DETECTED Final   Salmonella species NOT DETECTED NOT DETECTED Final   Yersinia enterocolitica NOT DETECTED NOT DETECTED Final   Vibrio species NOT DETECTED NOT DETECTED Final   Vibrio cholerae NOT DETECTED NOT DETECTED Final   Enteroaggregative E coli (EAEC) NOT DETECTED NOT DETECTED Final   Enteropathogenic E coli (EPEC) NOT DETECTED NOT DETECTED Final   Enterotoxigenic E coli (ETEC) NOT DETECTED NOT DETECTED Final   Shiga like toxin producing E coli (STEC) NOT DETECTED NOT DETECTED Final   Shigella/Enteroinvasive E coli (EIEC) NOT DETECTED NOT DETECTED Final   Cryptosporidium NOT DETECTED NOT DETECTED Final   Cyclospora cayetanensis NOT DETECTED NOT DETECTED Final   Entamoeba histolytica NOT DETECTED NOT DETECTED Final   Giardia lamblia NOT DETECTED NOT DETECTED Final   Adenovirus F40/41 NOT DETECTED NOT DETECTED Final   Astrovirus NOT DETECTED NOT DETECTED Final   Norovirus GI/GII NOT DETECTED NOT DETECTED Final   Rotavirus A NOT DETECTED NOT DETECTED Final   Sapovirus (I, II, IV, and V) NOT DETECTED NOT DETECTED Final    Comment: Performed at [facility], [address] [place], [place] [address]  Resp panel by RT-PCR (RSV,  Flu A&B, Covid) Urine, Clean Catch     Status: None   Collection Time: 04/19/23  1:43 PM   Specimen: Urine, Clean Catch; Nasal Swab  Result Value Ref Range Status   SARS Coronavirus 2 by RT PCR NEGATIVE NEGATIVE Final    Comment: (NOTE) SARS-CoV-2 target nucleic acids are NOT DETECTED.  The SARS-CoV-2 RNA is generally detectable in upper respiratory specimens during the acute phase of infection. The lowest concentration of SARS-CoV-2 viral copies this assay can detect is 138 copies/mL. A negative result does not preclude SARS-Cov-2 infection and should not be used as the sole basis for  treatment or other patient management decisions. A negative result may occur with  improper specimen collection/handling, submission of specimen other than nasopharyngeal swab, presence of viral mutation(s) within the areas targeted by this assay, and inadequate number of viral copies(<138 copies/mL). A negative result must be combined with clinical observations, patient history, and epidemiological information. The expected result is Negative.  Fact Sheet for Patients:  BloggerCourse.com  Fact Sheet for Healthcare Providers:  SeriousBroker.it  This test is no t yet approved or cleared by the [place] FDA and  has been authorized for detection and/or diagnosis of SARS-CoV-2 by FDA under an Emergency Use Authorization (EUA). This EUA will remain  in effect (meaning this test can be used) for the duration of the COVID-19 declaration under Section 564(b)(1) of the Act, 21 U.S.C.section 360bbb-3(b)(1), unless the authorization is terminated  or revoked sooner.       Influenza A by PCR NEGATIVE NEGATIVE Final   Influenza B by PCR NEGATIVE NEGATIVE Final    Comment: (NOTE) The Xpert Xpress SARS-CoV-2/FLU/RSV plus assay is intended as an aid in the diagnosis of influenza from Nasopharyngeal swab specimens and should not be used as a sole basis for  treatment. Nasal washings and aspirates are unacceptable for Xpert Xpress SARS-CoV-2/FLU/RSV testing.  Fact Sheet for Patients: BloggerCourse.com  Fact Sheet for Healthcare Providers: SeriousBroker.it  This test is not yet approved or cleared by the [place] FDA and has been authorized for detection and/or diagnosis of SARS-CoV-2 by FDA under an Emergency Use Authorization (EUA). This EUA will remain in effect (meaning this test can be used) for the duration of the COVID-19 declaration under Section 564(b)(1) of the Act, 21 U.S.C. section 360bbb-3(b)(1), unless the authorization is terminated or revoked.     Resp Syncytial Virus by PCR NEGATIVE NEGATIVE Final    Comment: (NOTE) Fact Sheet for Patients: BloggerCourse.com  Fact Sheet for Healthcare Providers: SeriousBroker.it  This test is not yet approved or cleared by the [place] FDA and has been authorized for detection and/or diagnosis of SARS-CoV-2 by FDA under an Emergency Use Authorization (EUA). This EUA will remain in effect (meaning this test can be used) for the duration of the COVID-19 declaration under Section 564(b)(1) of the Act, 21 U.S.C. section 360bbb-3(b)(1), unless the authorization is terminated or revoked.  Performed at [facility], [address] [place], [place] [address]      Time coordinating discharge: 35 minutes  SIGNED:   Glade Lloyd, MD  [facility] 04/21/2023, 10:12 AM

## 2023-04-22 LAB — CERULOPLASMIN: Ceruloplasmin: 26.4 mg/dL (ref 19.0–39.0)

## 2023-04-23 LAB — ANA: Anti Nuclear Antibody (ANA): NEGATIVE

## 2023-04-24 LAB — ANTI-SMOOTH MUSCLE ANTIBODY, IGG: F-Actin IgG: 17 Units (ref 0–19)

## 2023-04-24 LAB — IMMUNOGLOBULINS A/E/G/M, SERUM
IgA: 417 mg/dL — ABNORMAL HIGH (ref 87–352)
IgE (Immunoglobulin E), Serum: 27 IU/mL (ref 6–495)
IgG (Immunoglobin G), Serum: 1061 mg/dL (ref 586–1602)
IgM (Immunoglobulin M), Srm: 363 mg/dL — ABNORMAL HIGH (ref 26–217)

## 2023-06-05 ENCOUNTER — Other Ambulatory Visit (HOSPITAL_BASED_OUTPATIENT_CLINIC_OR_DEPARTMENT_OTHER): Payer: Self-pay

## 2023-06-05 ENCOUNTER — Other Ambulatory Visit: Payer: Self-pay

## 2023-06-05 ENCOUNTER — Emergency Department (HOSPITAL_BASED_OUTPATIENT_CLINIC_OR_DEPARTMENT_OTHER): Payer: Medicaid Other

## 2023-06-05 ENCOUNTER — Emergency Department (HOSPITAL_BASED_OUTPATIENT_CLINIC_OR_DEPARTMENT_OTHER): Payer: Medicaid Other | Admitting: Radiology

## 2023-06-05 ENCOUNTER — Encounter (HOSPITAL_BASED_OUTPATIENT_CLINIC_OR_DEPARTMENT_OTHER): Payer: Self-pay | Admitting: Emergency Medicine

## 2023-06-05 ENCOUNTER — Emergency Department (HOSPITAL_BASED_OUTPATIENT_CLINIC_OR_DEPARTMENT_OTHER)
Admission: EM | Admit: 2023-06-05 | Discharge: 2023-06-05 | Disposition: A | Discharge status: Home / Self Care | Payer: Medicaid Other | Attending: Emergency Medicine | Admitting: Emergency Medicine

## 2023-06-05 DIAGNOSIS — N3001 Acute cystitis with hematuria: Secondary | ICD-10-CM | POA: Diagnosis not present

## 2023-06-05 DIAGNOSIS — K219 Gastro-esophageal reflux disease without esophagitis: Secondary | ICD-10-CM | POA: Insufficient documentation

## 2023-06-05 DIAGNOSIS — J45909 Unspecified asthma, uncomplicated: Secondary | ICD-10-CM | POA: Insufficient documentation

## 2023-06-05 DIAGNOSIS — K859 Acute pancreatitis without necrosis or infection, unspecified: Secondary | ICD-10-CM | POA: Diagnosis not present

## 2023-06-05 DIAGNOSIS — E860 Dehydration: Secondary | ICD-10-CM | POA: Diagnosis not present

## 2023-06-05 DIAGNOSIS — R112 Nausea with vomiting, unspecified: Secondary | ICD-10-CM | POA: Diagnosis present

## 2023-06-05 LAB — URINALYSIS, ROUTINE W REFLEX MICROSCOPIC
Glucose, UA: NEGATIVE mg/dL
Ketones, ur: >80 mg/dL — AB
Nitrite: NEGATIVE
Protein, ur: 100 mg/dL — AB
Specific Gravity, Urine: 1.027 (ref 1.005–1.030)
WBC, UA: >50 WBC/hpf (ref 0–5)
pH: 6.5 (ref 5.0–8.0)

## 2023-06-05 LAB — COMPREHENSIVE METABOLIC PANEL
ALT: 31 U/L (ref 0–44)
AST: 38 U/L (ref 15–41)
Albumin: 4.5 g/dL (ref 3.5–5.0)
Alkaline Phosphatase: 58 U/L (ref 38–126)
Anion gap: 20 — ABNORMAL HIGH (ref 5–15)
BUN: 11 mg/dL (ref 6–20)
CO2: 23 mmol/L (ref 22–32)
Calcium: 9.6 mg/dL (ref 8.9–10.3)
Chloride: 94 mmol/L — ABNORMAL LOW (ref 98–111)
Creatinine, Ser: 0.77 mg/dL (ref 0.44–1.00)
GFR, Estimated: >60 mL/min (ref >60)
Glucose, Bld: 132 mg/dL — ABNORMAL HIGH (ref 70–99)
Potassium: 3.7 mmol/L (ref 3.5–5.1)
Sodium: 137 mmol/L (ref 135–145)
Total Bilirubin: 0.7 mg/dL (ref 0.3–1.2)
Total Protein: 8 g/dL (ref 6.5–8.1)

## 2023-06-05 LAB — CBC
HCT: 39.9 % (ref 36.0–46.0)
Hemoglobin: 13.2 g/dL (ref 12.0–15.0)
MCH: 31.6 pg (ref 26.0–34.0)
MCHC: 33.1 g/dL (ref 30.0–36.0)
MCV: 95.5 fL (ref 80.0–100.0)
Platelets: 150 10*3/uL (ref 150–400)
RBC: 4.18 MIL/uL (ref 3.87–5.11)
RDW: 13.6 % (ref 11.5–15.5)
WBC: 9.3 10*3/uL (ref 4.0–10.5)
nRBC: 0 % (ref 0.0–0.2)

## 2023-06-05 LAB — LIPASE, BLOOD: Lipase: 390 U/L — ABNORMAL HIGH (ref 11–51)

## 2023-06-05 LAB — RAPID URINE DRUG SCREEN, HOSP PERFORMED
Amphetamines: NOT DETECTED
Barbiturates: NOT DETECTED
Benzodiazepines: POSITIVE — AB
Cocaine: NOT DETECTED
Opiates: NOT DETECTED
Tetrahydrocannabinol: POSITIVE — AB

## 2023-06-05 LAB — PREGNANCY, URINE: Preg Test, Ur: NEGATIVE

## 2023-06-05 MED ORDER — ONDANSETRON HCL 4 MG PO TABS
4.0000 mg | ORAL_TABLET | Freq: Three times a day (TID) | ORAL | 0 refills | Status: AC | PRN
  Filled 2023-06-05: qty 9, 3d supply, fill #0

## 2023-06-05 MED ORDER — CEPHALEXIN 500 MG PO CAPS
500.0000 mg | ORAL_CAPSULE | Freq: Three times a day (TID) | ORAL | 0 refills | Status: AC
  Filled 2023-06-05: qty 15, 5d supply, fill #0

## 2023-06-05 MED ORDER — LACTATED RINGERS IV BOLUS
1000.0000 mL | Freq: Once | INTRAVENOUS | Status: AC
  Administered 2023-06-05: 1000 mL via INTRAVENOUS

## 2023-06-05 MED ORDER — PANTOPRAZOLE SODIUM 40 MG IV SOLR
40.0000 mg | Freq: Once | INTRAVENOUS | Status: AC
  Administered 2023-06-05: 40 mg via INTRAVENOUS
  Filled 2023-06-05: qty 10

## 2023-06-05 MED ORDER — ONDANSETRON HCL 4 MG/2ML IJ SOLN
4.0000 mg | Freq: Once | INTRAMUSCULAR | Status: AC
  Administered 2023-06-05: 4 mg via INTRAVENOUS
  Filled 2023-06-05: qty 2

## 2023-06-05 MED ORDER — DICYCLOMINE HCL 20 MG PO TABS
20.0000 mg | ORAL_TABLET | Freq: Three times a day (TID) | ORAL | 0 refills | Status: DC | PRN
  Filled 2023-06-05: qty 9, 3d supply, fill #0

## 2023-06-05 MED ORDER — SODIUM CHLORIDE 0.9 % IV SOLN
1.0000 g | Freq: Once | INTRAVENOUS | Status: AC
  Administered 2023-06-05: 1 g via INTRAVENOUS
  Filled 2023-06-05: qty 10

## 2023-06-05 MED ORDER — SODIUM CHLORIDE 0.9 % IV BOLUS
500.0000 mL | Freq: Once | INTRAVENOUS | Status: AC
  Administered 2023-06-05: 500 mL via INTRAVENOUS

## 2023-06-05 NOTE — ED Notes (Signed)
Pt aware of the need for a urine... Unable to currently provide the sample... 

## 2023-06-05 NOTE — ED Triage Notes (Signed)
Pt arrives to ED with c/o vomiting and nausea x1 day.

## 2023-06-05 NOTE — ED Provider Notes (Signed)
[place] EMERGENCY DEPARTMENT AT [facility] Provider Note   CSN: [id number] Arrival date & time: 06/05/23  7829     History  Chief Complaint  Patient presents with   Emesis    Deborah Juarez is a 32 y.o. female with past medical history anxiety, asthma, GERD who presents to the ED complaining of nausea and vomiting since yesterday.  No known sick contacts.  States that she has had more episodes of vomiting and she can count.  Mild associated suprapubic abdominal pain that was worse when trying to urinate but she states that she believes this was secondary to dehydration and after attempting to hydrate she had no issues or increased pain when urinating.  No associated dysuria or hematuria.  Her menstrual cycle ended yesterday but she decided it was more painful and longer than normal.  No associated fever, chills, chest pain, shortness of breath, abnormal vaginal discharge.  States that yesterday she felt constipated and had multiple hard bowel movements but today she has been having diarrhea.  No known spoiled foods.  No other recent illness.  Does state that in the past she has struggled a lot with nausea, vomiting, and reflux.  She used to be on Protonix but stopped taking this as she had an adverse side effect of abdominal cramping.  States that she has never been to GI for further evaluation of the symptoms.  Previous abdominal surgeries include C-section x 1.  Of note, patient notes that she had a mechanical fall approximately 3 weeks ago where she slipped down stairs and fell on her tailbone.  She complains of associated bruising and pain to her left sacrum.  She is able to ambulate without difficulty.  No previous evaluation for this      Home Medications Prior to Admission medications   Medication Sig Start Date End Date Taking? Authorizing Provider  cephALEXin (KEFLEX) 500 MG capsule Take 1 capsule (500 mg total) by mouth 3 (three) times daily for 5 days. 06/05/23  06/10/23 Yes Fredi Hurtado L, PA-C  dicyclomine (BENTYL) 20 MG tablet Take 1 tablet (20 mg total) by mouth 3 (three) times daily as needed for up to 3 days for spasms. 06/05/23 06/08/23 Yes Darcella Shiffman L, PA-C  ondansetron (ZOFRAN) 4 MG tablet Take 1 tablet (4 mg total) by mouth every 8 (eight) hours as needed for up to 3 days for nausea or vomiting. 06/05/23 06/08/23 Yes Klyn Kroening L, PA-C  Aspirin-Salicylamide-Caffeine (BC HEADACHE POWDER PO) Take 1 Package by mouth 2 (two) times daily as needed (Pain).    [provider]  hydrOXYzine (VISTARIL) 50 MG capsule Take 50 mg by mouth every 8 (eight) hours as needed for anxiety. 01/23/23   [provider]  loperamide (IMODIUM) 2 MG capsule Take 1 capsule (2 mg total) by mouth every 6 (six) hours as needed for diarrhea or loose stools. 04/21/23   Glade Lloyd, MD  pantoprazole (PROTONIX) 40 MG tablet Take 1 tablet (40 mg total) by mouth daily. 04/21/23   Glade Lloyd, MD  sertraline (ZOLOFT) 100 MG tablet Take 100 mg by mouth daily. 02/20/23   [provider]      Allergies    Patient has no known allergies.    Review of Systems   Review of Systems  All other systems reviewed and are negative.   Physical Exam Updated Vital Signs BP (!) 123/100   Pulse 88   Temp 98.1 F (36.7 C) (Oral)   Resp 12  Ht 5\' 7"  (1.702 m)   Wt 61.2 kg   SpO2 100%   BMI 21.14 kg/m  Physical Exam Vitals and nursing note reviewed.  Constitutional:      General: She is not in acute distress.    Appearance: Normal appearance.  HENT:     Head: Normocephalic and atraumatic.     Mouth/Throat:     Mouth: Mucous membranes are dry.  Eyes:     General: No scleral icterus.    Extraocular Movements: Extraocular movements intact.     Conjunctiva/sclera: Conjunctivae normal.  Cardiovascular:     Rate and Rhythm: Normal rate and regular rhythm.     Heart sounds: No murmur heard. Pulmonary:     Effort: Pulmonary effort is normal.      Breath sounds: Normal breath sounds.  Abdominal:     General: Abdomen is flat. There is no distension.     Palpations: Abdomen is soft.     Tenderness: There is no abdominal tenderness. There is no right CVA tenderness, left CVA tenderness, guarding or rebound.  Musculoskeletal:        General: Normal range of motion.     Cervical back: Neck supple.     Right lower leg: No edema.     Left lower leg: No edema.     Comments: Ecchymosis over left sacrum  Skin:    General: Skin is warm and dry.     Capillary Refill: Capillary refill takes less than 2 seconds.  Neurological:     Mental Status: She is alert. Mental status is at baseline.  Psychiatric:        Behavior: Behavior normal.     ED Results / Procedures / Treatments   Labs (all labs ordered are listed, but only abnormal results are displayed) Labs Reviewed  LIPASE, BLOOD - Abnormal; Notable for the following components:      Result Value   Lipase 390 (*)    All other components within normal limits  COMPREHENSIVE METABOLIC PANEL - Abnormal; Notable for the following components:   Chloride 94 (*)    Glucose, Bld 132 (*)    Anion gap 20 (*)    All other components within normal limits  URINALYSIS, ROUTINE W REFLEX MICROSCOPIC - Abnormal; Notable for the following components:   APPearance HAZY (*)    Hgb urine dipstick MODERATE (*)    Bilirubin Urine SMALL (*)    Ketones, ur >80 (*)    Protein, ur 100 (*)    Leukocytes,Ua MODERATE (*)    Bacteria, UA RARE (*)    All other components within normal limits  RAPID URINE DRUG SCREEN, HOSP PERFORMED - Abnormal; Notable for the following components:   Benzodiazepines POSITIVE (*)    Tetrahydrocannabinol POSITIVE (*)    All other components within normal limits  CBC  PREGNANCY, URINE    EKG None  Radiology DG Sacrum/Coccyx  Result Date: 06/05/2023 CLINICAL DATA:  fall EXAM: SACRUM AND COCCYX - 2+ VIEW COMPARISON:  None Available. FINDINGS: Normal and symmetric  sacroiliac joints. No acute fracture or dislocation. No aggressive osseous lesion. Visualized sacral arcuate lines are unremarkable. Unremarkable symphysis pubis. Unremarkable bilateral hip joints. No radiopaque foreign bodies. IMPRESSION: *No acute fracture or dislocation. Electronically Signed   By: Jules Schick M.D.   On: 06/05/2023 12:04   US PELVIC COMPLETE W TRANSVAGINAL AND TORSION R/O  Result Date: 06/05/2023 CLINICAL DATA:  [id number] Pelvic pain [id number] EXAM: TRANSABDOMINAL AND TRANSVAGINAL ULTRASOUND OF PELVIS DOPPLER ULTRASOUND OF  OVARIES TECHNIQUE: Both transabdominal and transvaginal ultrasound examinations of the pelvis were performed. Transabdominal technique was performed for global imaging of the pelvis including uterus, ovaries, adnexal regions, and pelvic cul-de-sac. It was necessary to proceed with endovaginal exam following the transabdominal exam to visualize the adnexa. Color and duplex Doppler ultrasound was utilized to evaluate blood flow to the ovaries. COMPARISON:  None Available. FINDINGS: Uterus Measurements: 3.1 x 4.6 x 6.6 cm = volume: 50.0 mL. No fibroids or other mass visualized. Endometrium Thickness: 5.1 mm.  No focal abnormality visualized. Right ovary Measurements: 1.2 x 1.5 x 2.7 cm = volume: 2.5 mL. Normal appearance/no adnexal mass. Left ovary Measurements: 1.2 x 1.5 x 2.3 cm = volume: 2.1 mL. Normal appearance/no adnexal mass. Pulsed Doppler evaluation of both ovaries demonstrates normal low-resistance arterial and venous waveforms. Other findings No abnormal free fluid. IMPRESSION: *Normal pelvic sonogram. Electronically Signed   By: Jules Schick M.D.   On: 06/05/2023 12:02    Procedures Procedures    Medications Ordered in ED Medications  lactated ringers bolus 1,000 mL (0 mLs Intravenous Stopped 06/05/23 1139)  ondansetron (ZOFRAN) injection 4 mg (4 mg Intravenous Given 06/05/23 1005)  pantoprazole (PROTONIX) injection 40 mg (40 mg Intravenous Given 06/05/23  1007)  cefTRIAXone (ROCEPHIN) 1 g in sodium chloride 0.9 % 100 mL IVPB (1 g Intravenous New Bag/Given 06/05/23 1235)  sodium chloride 0.9 % bolus 500 mL (500 mLs Intravenous New Bag/Given 06/05/23 1234)    ED Course/ Medical Decision Making/ A&P                             Medical Decision Making Amount and/or Complexity of Data Reviewed Labs: ordered. Decision-making details documented in ED Course. Radiology: ordered. Decision-making details documented in ED Course. ECG/medicine tests: ordered. Decision-making details documented in ED Course.  Risk Prescription drug management.   Medical Decision Making:   Norinne Rashanda Magloire is a 32 y.o. female who presented to the ED today with vomiting / abdominal pain detailed above.    Additional history discussed with patient's family/caregivers.  Complete initial physical exam performed, notably the patient  was in NAD. Abdomen soft, nontender, nondistended, no peritoneal signs.  No CVA tenderness.  Neurologically intact.  Dry mucous membranes. Reviewed and confirmed nursing documentation for past medical history, family history, social history.    Initial Assessment:   With the patient's presentation of vomiting / abdominal pain, differential diagnosis includes but is not limited to AAA, mesenteric ischemia, appendicitis, diverticulitis, DKA, gastritis, gastroenteritis, AMI, nephrolithiasis, pancreatitis, peritonitis, adrenal insufficiency, intestinal ischemia, constipation, UTI, SBO/LBO, splenic rupture, biliary disease, IBD, IBS, PUD, hepatitis, STD, ovarian/testicular torsion, electrolyte disturbance, DKA, dehydration, acute kidney injury, renal failure, cholecystitis, cholelithiasis, choledocholithiasis, abdominal pain of  unknown etiology, pregnancy, incomplete abortion, septic abortion, threatened abortion, ectopic pregnancy, PID.   Initial Plan:  Screening labs including CBC and Metabolic panel to evaluate for infectious or metabolic  etiology of disease.  Lipase to evaluate for pancreatitis Urinalysis with reflex culture ordered to evaluate for UTI or relevant urologic/nephrologic pathology.  Pregnancy testing Pelvic ultrasound due to recent abnormal.,  Abdominal pain EKG to evaluate for cardiac pathology and to evaluate QTc with history of prolonged Qtc Sacrum x-ray at patient request due to your recent mechanical fall Symptomatic management Objective evaluation as reviewed   Initial Study Results:   Laboratory  All laboratory results reviewed without evidence of clinically relevant pathology.   Exceptions include: Chloride 94, glucose 132,  anion gap 20, lipase 390, UA with moderate hemoglobin, moderate leukocytes, UDS positive for benzos and THC  EKG EKG was reviewed independently. ST segments without concerns for elevations.   EKG: normal sinus rhythm.   Radiology:  All images reviewed independently. Agree with radiology report at this time.   DG Sacrum/Coccyx  Result Date: 06/05/2023 CLINICAL DATA:  fall EXAM: SACRUM AND COCCYX - 2+ VIEW COMPARISON:  None Available. FINDINGS: Normal and symmetric sacroiliac joints. No acute fracture or dislocation. No aggressive osseous lesion. Visualized sacral arcuate lines are unremarkable. Unremarkable symphysis pubis. Unremarkable bilateral hip joints. No radiopaque foreign bodies. IMPRESSION: *No acute fracture or dislocation. Electronically Signed   By: Jules Schick M.D.   On: 06/05/2023 12:04   US PELVIC COMPLETE W TRANSVAGINAL AND TORSION R/O  Result Date: 06/05/2023 CLINICAL DATA:  [id number] Pelvic pain [id number] EXAM: TRANSABDOMINAL AND TRANSVAGINAL ULTRASOUND OF PELVIS DOPPLER ULTRASOUND OF OVARIES TECHNIQUE: Both transabdominal and transvaginal ultrasound examinations of the pelvis were performed. Transabdominal technique was performed for global imaging of the pelvis including uterus, ovaries, adnexal regions, and pelvic cul-de-sac. It was necessary to proceed with  endovaginal exam following the transabdominal exam to visualize the adnexa. Color and duplex Doppler ultrasound was utilized to evaluate blood flow to the ovaries. COMPARISON:  None Available. FINDINGS: Uterus Measurements: 3.1 x 4.6 x 6.6 cm = volume: 50.0 mL. No fibroids or other mass visualized. Endometrium Thickness: 5.1 mm.  No focal abnormality visualized. Right ovary Measurements: 1.2 x 1.5 x 2.7 cm = volume: 2.5 mL. Normal appearance/no adnexal mass. Left ovary Measurements: 1.2 x 1.5 x 2.3 cm = volume: 2.1 mL. Normal appearance/no adnexal mass. Pulsed Doppler evaluation of both ovaries demonstrates normal low-resistance arterial and venous waveforms. Other findings No abnormal free fluid. IMPRESSION: *Normal pelvic sonogram. Electronically Signed   By: Jules Schick M.D.   On: 06/05/2023 12:02      Final Assessment and Plan:   32 year old female presents to the ED complaining of nausea, vomiting, and mild abdominal pain.  Recommendations above for further assessment.  Abdomen soft and nontender on exam.  No CVA tenderness.  Patient does appear slightly dehydrated.  She notes that she has somewhat chronically had symptoms intermittently but never been seen by GI.  Also notes recent mechanical fall with sacral pain.  X-ray of the sacrum/coccyx negative.  Able to ambulate.  Overall symptoms are improving.  Also notes that recent menstrual cycle was heavier and longer than normal.  Ultrasound obtained with no acute findings.  Denies vaginal discharge, low suspicion for PID.  Labs reveal a lipase of 390.  Patient endorses alcohol use but notes that is infrequently.  Following dose of Zofran and initial 1 L bolus of fluids, patient states that she is feeling much better and tolerating p.o. with resolution of pain.  UA does appear acutely infected.  UDS positive for benzos and THC.  Question underlying hyperemesis syndrome as well.  Pregnancy test negative.  As patient is tolerating p.o. with significant  improvement in symptoms, discussed discharge with patient which she is comfortable with that reiterated importance of close follow-up with GI and she is agreeable to do so.  Will provide antiemetics at home in addition to Bentyl for abdominal pain and Keflex to treat UTI.  Dose of Rocephin also given in the ED for UTI treatment.  Patient expressed understanding of plan.  Discussed lifestyle modifications until she can follow-up with GI.  Strict ED return precautions given, all questions answered, and stable for  discharge.   Clinical Impression:  1. Acute pancreatitis, unspecified complication status, unspecified pancreatitis type   2. Acute cystitis with hematuria   3. Nausea and vomiting, unspecified vomiting type   4. Dehydration      Discharge           Final Clinical Impression(s) / ED Diagnoses Final diagnoses:  Acute cystitis with hematuria  Nausea and vomiting, unspecified vomiting type  Dehydration  Acute pancreatitis, unspecified complication status, unspecified pancreatitis type    Rx / DC Orders ED Discharge Orders          Ordered    cephALEXin (KEFLEX) 500 MG capsule  3 times daily        06/05/23 1226    ondansetron (ZOFRAN) 4 MG tablet  Every 8 hours PRN        06/05/23 1226    dicyclomine (BENTYL) 20 MG tablet  3 times daily PRN        06/05/23 1226    Ambulatory referral to Gastroenterology  Status:  Canceled        06/05/23 1228    Ambulatory referral to Gastroenterology        06/05/23 1234              Tonette Lederer, PA-C 06/05/23 1310    Melene Plan, DO 06/05/23 1317

## 2023-06-05 NOTE — Discharge Instructions (Addendum)
Thank you for letting us take care of you today.  We were able to control her nausea and gave you fluids to help with dehydration.  Your ultrasound was normal.  Your blood work showed an elevated pancreatic enzyme as well as signs of urinary tract infection.  We gave you a dose of IV antibiotics and will send you home on Keflex to continue to treat your UTI.  I am sending home with Zofran to treat nausea and Bentyl to treat abdominal pain as needed.  With your elevated pancreatic enzyme, you should avoid alcohol use, Tylenol use until you can follow-up with GI for further assessment.  I referred you to GI.  They should call you within 72 hours to schedule a follow-up appointment.  If you do not hear from them, call their office to schedule his appointment.  With your history of struggling with nausea, recommend avoiding common triggers such as alcohol, spicy foods, marijuana, tobacco, or greasy/fatty foods.  Continue to stay well-hydrated at home using water or electrolyte replacement solutions such as Gatorade.  For new or worsening condition including severe abdominal pain, uncontrolled nausea using medications, fever, chest pain, shortness of breath, or other new, concerning symptoms, return to the nearest ED for reevaluation.

## 2023-08-27 ENCOUNTER — Other Ambulatory Visit: Payer: Self-pay

## 2023-08-27 MED ORDER — MEDROXYPROGESTERONE ACETATE 150 MG/ML IM SUSY
150.0000 mg | PREFILLED_SYRINGE | INTRAMUSCULAR | 3 refills | Status: DC
  Filled 2023-08-27: qty 1, 90d supply, fill #0
  Filled 2023-11-27: qty 1, 90d supply, fill #1
  Filled 2024-02-12: qty 1, 90d supply, fill #2
  Filled 2024-04-29: qty 1, 90d supply, fill #3

## 2023-11-02 ENCOUNTER — Emergency Department (HOSPITAL_BASED_OUTPATIENT_CLINIC_OR_DEPARTMENT_OTHER): Payer: Medicaid Other

## 2023-11-02 ENCOUNTER — Other Ambulatory Visit: Payer: Self-pay

## 2023-11-02 ENCOUNTER — Emergency Department (HOSPITAL_BASED_OUTPATIENT_CLINIC_OR_DEPARTMENT_OTHER)
Admission: EM | Admit: 2023-11-02 | Discharge: 2023-11-02 | Disposition: A | Discharge status: Home / Self Care | Payer: Medicaid Other | Attending: Emergency Medicine | Admitting: Emergency Medicine

## 2023-11-02 DIAGNOSIS — R000 Tachycardia, unspecified: Secondary | ICD-10-CM | POA: Insufficient documentation

## 2023-11-02 DIAGNOSIS — J45909 Unspecified asthma, uncomplicated: Secondary | ICD-10-CM | POA: Insufficient documentation

## 2023-11-02 DIAGNOSIS — R7989 Other specified abnormal findings of blood chemistry: Secondary | ICD-10-CM | POA: Insufficient documentation

## 2023-11-02 DIAGNOSIS — R112 Nausea with vomiting, unspecified: Secondary | ICD-10-CM | POA: Insufficient documentation

## 2023-11-02 DIAGNOSIS — R1010 Upper abdominal pain, unspecified: Secondary | ICD-10-CM | POA: Diagnosis not present

## 2023-11-02 DIAGNOSIS — K209 Esophagitis, unspecified without bleeding: Secondary | ICD-10-CM

## 2023-11-02 DIAGNOSIS — K8020 Calculus of gallbladder without cholecystitis without obstruction: Secondary | ICD-10-CM

## 2023-11-02 DIAGNOSIS — R748 Abnormal levels of other serum enzymes: Secondary | ICD-10-CM

## 2023-11-02 DIAGNOSIS — Z7982 Long term (current) use of aspirin: Secondary | ICD-10-CM | POA: Diagnosis not present

## 2023-11-02 LAB — CBC WITH DIFFERENTIAL/PLATELET
Abs Immature Granulocytes: 0.02 10*3/uL (ref 0.00–0.07)
Basophils Absolute: 0.1 10*3/uL (ref 0.0–0.1)
Basophils Relative: 1 %
Eosinophils Absolute: 0 10*3/uL (ref 0.0–0.5)
Eosinophils Relative: 0 %
HCT: 36.9 % (ref 36.0–46.0)
Hemoglobin: 11.8 g/dL — ABNORMAL LOW (ref 12.0–15.0)
Immature Granulocytes: 0 %
Lymphocytes Relative: 21 %
Lymphs Abs: 1.3 10*3/uL (ref 0.7–4.0)
MCH: 32.3 pg (ref 26.0–34.0)
MCHC: 32 g/dL (ref 30.0–36.0)
MCV: 101.1 fL — ABNORMAL HIGH (ref 80.0–100.0)
Monocytes Absolute: 0.4 10*3/uL (ref 0.1–1.0)
Monocytes Relative: 7 %
Neutro Abs: 4.3 10*3/uL (ref 1.7–7.7)
Neutrophils Relative %: 71 %
Platelets: 205 10*3/uL (ref 150–400)
RBC: 3.65 MIL/uL — ABNORMAL LOW (ref 3.87–5.11)
RDW: 14.2 % (ref 11.5–15.5)
WBC: 6 10*3/uL (ref 4.0–10.5)
nRBC: 0 % (ref 0.0–0.2)

## 2023-11-02 LAB — COMPREHENSIVE METABOLIC PANEL
ALT: 43 U/L (ref 0–44)
AST: 94 U/L — ABNORMAL HIGH (ref 15–41)
Albumin: 4.7 g/dL (ref 3.5–5.0)
Alkaline Phosphatase: 47 U/L (ref 38–126)
Anion gap: 24 — ABNORMAL HIGH (ref 5–15)
BUN: 7 mg/dL (ref 6–20)
CO2: 18 mmol/L — ABNORMAL LOW (ref 22–32)
Calcium: 9.2 mg/dL (ref 8.9–10.3)
Chloride: 99 mmol/L (ref 98–111)
Creatinine, Ser: 0.76 mg/dL (ref 0.44–1.00)
GFR, Estimated: >60 mL/min (ref >60)
Glucose, Bld: 71 mg/dL (ref 70–99)
Potassium: 4.3 mmol/L (ref 3.5–5.1)
Sodium: 141 mmol/L (ref 135–145)
Total Bilirubin: 0.4 mg/dL (ref <1.2)
Total Protein: 7.9 g/dL (ref 6.5–8.1)

## 2023-11-02 LAB — HCG, SERUM, QUALITATIVE: Preg, Serum: NEGATIVE

## 2023-11-02 LAB — LIPASE, BLOOD: Lipase: 104 U/L — ABNORMAL HIGH (ref 11–51)

## 2023-11-02 MED ORDER — ONDANSETRON HCL 4 MG/2ML IJ SOLN
4.0000 mg | Freq: Once | INTRAMUSCULAR | Status: AC
  Administered 2023-11-02: 4 mg via INTRAVENOUS
  Filled 2023-11-02: qty 2

## 2023-11-02 MED ORDER — PANTOPRAZOLE SODIUM 40 MG PO TBEC: 40.0000 mg | DELAYED_RELEASE_TABLET | Freq: Every day | ORAL | 0 refills | Status: DC

## 2023-11-02 MED ORDER — SODIUM CHLORIDE 0.9 % IV BOLUS
1000.0000 mL | Freq: Once | INTRAVENOUS | Status: AC
  Administered 2023-11-02: 1000 mL via INTRAVENOUS

## 2023-11-02 MED ORDER — FAMOTIDINE IN NACL 20-0.9 MG/50ML-% IV SOLN
20.0000 mg | Freq: Once | INTRAVENOUS | Status: AC
  Administered 2023-11-02: 20 mg via INTRAVENOUS
  Filled 2023-11-02: qty 50

## 2023-11-02 MED ORDER — FAMOTIDINE 40 MG PO TABS: 40.0000 mg | ORAL_TABLET | Freq: Every day | ORAL | 0 refills | Status: DC

## 2023-11-02 MED ORDER — DROPERIDOL 2.5 MG/ML IJ SOLN
1.2500 mg | Freq: Once | INTRAMUSCULAR | Status: AC
  Administered 2023-11-02: 1.25 mg via INTRAVENOUS
  Filled 2023-11-02: qty 2

## 2023-11-02 MED ORDER — PROCHLORPERAZINE EDISYLATE 10 MG/2ML IJ SOLN
5.0000 mg | Freq: Once | INTRAMUSCULAR | Status: AC
  Administered 2023-11-02: 5 mg via INTRAVENOUS
  Filled 2023-11-02: qty 2

## 2023-11-02 MED ORDER — IOHEXOL 300 MG/ML  SOLN
80.0000 mL | Freq: Once | INTRAMUSCULAR | Status: AC | PRN
  Administered 2023-11-02: 80 mL via INTRAVENOUS

## 2023-11-02 NOTE — ED Notes (Signed)
Pt given water for PO challenge 

## 2023-11-02 NOTE — ED Notes (Signed)
Pt unable to hold water down, actively vomiting.

## 2023-11-02 NOTE — ED Triage Notes (Signed)
Brought in by EMS, IV access and 4mg  zofran given  N/v, abdo pain (LUQ) X few days. "Unable to keep anything down"  Vomiting in triage

## 2023-11-02 NOTE — ED Provider Notes (Signed)
[place] EMERGENCY DEPARTMENT AT [facility] Provider Note   CSN: [id number] Arrival date & time: 11/02/23  1125     History  Chief Complaint  Patient presents with   Abdominal Pain   Emesis    Deborah Juarez is a 32 y.o. female.   Abdominal Pain Associated symptoms: vomiting   Emesis Associated symptoms: abdominal pain   Patient with abdominal pain nausea and vomiting for the last few days.  History of pancreatitis.  States nothing will stay down.  Has had nausea and vomiting this been intractable in past.  Thought to be secondary to either alcohol use or potentially marijuana use or cyclical vomiting.  Has had cholelithiasis without cholecystitis.  LFTs previously been elevated.  States she has been drinking more due to stress in her life and has also been using marijuana.    Past Medical History:  Diagnosis Date   Anxiety    Anxiety    Asthma    "long " time since used rescue inhaler   GERD (gastroesophageal reflux disease)    Post partum depression     Home Medications Prior to Admission medications   Medication Sig Start Date End Date Taking? Authorizing Provider  Aspirin-Salicylamide-Caffeine (BC HEADACHE POWDER PO) Take 1 Package by mouth 2 (two) times daily as needed (Pain).    [provider]  dicyclomine (BENTYL) 20 MG tablet Take 1 tablet (20 mg total) by mouth 3 (three) times daily as needed for up to 3 days for spasms. 06/05/23 06/09/23  Gowens, Mariah L, PA-C  hydrOXYzine (VISTARIL) 50 MG capsule Take 50 mg by mouth every 8 (eight) hours as needed for anxiety. 01/23/23   [provider]  loperamide (IMODIUM) 2 MG capsule Take 1 capsule (2 mg total) by mouth every 6 (six) hours as needed for diarrhea or loose stools. 04/21/23   Glade Lloyd, MD  medroxyPROGESTERone Acetate 150 MG/ML SUSY Inject 1 mL (150 mg total) into the muscle every 3 (three) months. 08/27/23     pantoprazole (PROTONIX) 40 MG tablet Take 1 tablet (40 mg  total) by mouth daily. 04/21/23   Glade Lloyd, MD  sertraline (ZOLOFT) 100 MG tablet Take 100 mg by mouth daily. 02/20/23   [provider]      Allergies    Patient has no known allergies.    Review of Systems   Review of Systems  Gastrointestinal:  Positive for abdominal pain and vomiting.    Physical Exam Updated Vital Signs BP 121/81   Pulse 89   Temp 98.4 F (36.9 C)   Resp 16   SpO2 98%  Physical Exam Cardiovascular:     Rate and Rhythm: Tachycardia present.  Abdominal:     Tenderness: There is abdominal tenderness.     Comments: Upper abdominal tenderness without rebound or guarding.  No hernia palpated.  Skin:    General: Skin is warm.  Neurological:     Mental Status: She is alert.     ED Results / Procedures / Treatments   Labs (all labs ordered are listed, but only abnormal results are displayed) Labs Reviewed  COMPREHENSIVE METABOLIC PANEL - Abnormal; Notable for the following components:      Result Value   CO2 18 (*)    AST 94 (*)    Anion gap 24 (*)    All other components within normal limits  LIPASE, BLOOD - Abnormal; Notable for the following components:   Lipase 104 (*)    All other components  within normal limits  CBC WITH DIFFERENTIAL/PLATELET - Abnormal; Notable for the following components:   RBC 3.65 (*)    Hemoglobin 11.8 (*)    MCV 101.1 (*)    All other components within normal limits  HCG, SERUM, QUALITATIVE    EKG EKG Interpretation Date/Time:  Friday November 02 2023 12:34:05 EST Ventricular Rate:  104 PR Interval:  130 QRS Duration:  93 QT Interval:  360 QTC Calculation: 474 R Axis:   78  Text Interpretation: Sinus tachycardia Probable left atrial enlargement Confirmed by Benjiman Core [phone]) on 11/02/2023 12:50:23 PM  Radiology No results found.  Procedures Procedures    Medications Ordered in ED Medications  sodium chloride 0.9 % bolus 1,000 mL (1,000 mLs Intravenous New Bag/Given 11/02/23 1240)   prochlorperazine (COMPAZINE) injection 5 mg (5 mg Intravenous Given 11/02/23 1255)  iohexol (OMNIPAQUE) 300 MG/ML solution 80 mL (80 mLs Intravenous Contrast Given 11/02/23 1340)    ED Course/ Medical Decision Making/ A&P                                 Medical Decision Making Amount and/or Complexity of Data Reviewed Labs: ordered. Radiology: ordered.  Risk Prescription drug management.   Patient abdominal pain.  Nausea and vomiting.  Differential diagnoses long but includes cause such as cannabinoid hyperemesis, pancreatitis, cyclical vomiting.  Gastroenteritis.  Lipase is elevated.  Has had previous cholelithiasis.  I do not see the CT scans done previously.  Also has alcohol use and marijuana use.  LFTs have improved from prior.  Will get CT scan to further evaluate.  Will give fluids and Compazine.  I have reviewed previous discharge note.  CT scan shows cholelithiasis without cholecystitis.  No pancreatitis seen.  Feeling somewhat better after initial dose of IV fluids and antiemetics.  Will give more fluids and more antiemetics.  Patient does hope to be discharged home.  Care turned over to Dr.Lawsing         Final Clinical Impression(s) / ED Diagnoses Final diagnoses:  None    Rx / DC Orders ED Discharge Orders     None         Benjiman Core, MD 11/02/23 1536

## 2023-11-02 NOTE — ED Provider Notes (Signed)
  Physical Exam  BP (!) 136/90   Pulse 87   Temp 98.4 F (36.9 C)   Resp 16   SpO2 98%   Physical Exam  Procedures  Procedures  ED Course / MDM    Medical Decision Making Amount and/or Complexity of Data Reviewed Labs: ordered. Radiology: ordered.  Risk Prescription drug management.   52F with hx of cannabis and emesis, presenting with emesis.   Laboratory evaluation was with mildly elevated lipase to 104, serum pregnancy negative, CMP with an anion gap acidosis with a bicarbonate of 18 and anion gap of 24.  CBC with a mild anemia to 11.8, no leukocytosis.  CT abdomen pelvis:  IMPRESSION:  Wall thickening of distal thoracic esophagus, consistent with  esophagitis.    Moderate to severe hepatic steatosis.    Cholelithiasis. No radiographic evidence of cholecystitis.    Tiny bilateral renal calculi. No evidence of ureteral calculi or  hydronephrosis.      Electronically Signed    The patient states that she has cut back on her cannabis use.  Patient was administered droperidol and Pepcid with improvement in symptoms.  She has previously been referred outpatient to gastroenterology but has not yet followed up outpatient.  She has mild tenderness in the left upper quadrant, epigastrium and right upper quadrant.  No CT imaging of pancreatitis and lipase not elevated to meet diagnostic criteria for pancreatitis, no evidence of liver dysfunction on labs, mildly elevated AST to 94, ALT 43, T. bili normal at 0.4.  Patient is tolerating oral intake, on repeat assessment was feeling symptomatically improved.  Considered admission for observation in the setting of the patient's presentation and findings versus discharge and close outpatient follow-up.  The patient would prefer to follow-up outpatient.  Recommend that she follow-up outpatient with gastroenterology, will prescribe Pepcid and Protonix for the findings of esophagitis.  Additionally, given the findings of cholelithiasis  with mild right upper quadrant tenderness on exam, considered intermittent symptomatic cholelithiasis as etiology of the patient's episodes.  Will provide a referral to outpatient follow-up with general surgery.  Strict return precautions were provided in the event of any severe worsening symptoms.  Stable for discharge.     Ernie Avena, MD 11/02/23 [phone]

## 2023-11-02 NOTE — Discharge Instructions (Signed)
These follow-up outpatient with gastroenterology and general surgery given your workup today.

## 2023-11-03 ENCOUNTER — Emergency Department (HOSPITAL_BASED_OUTPATIENT_CLINIC_OR_DEPARTMENT_OTHER)
Admission: EM | Admit: 2023-11-03 | Discharge: 2023-11-03 | Disposition: A | Discharge status: Home / Self Care | Payer: Medicaid Other | Attending: Emergency Medicine | Admitting: Emergency Medicine

## 2023-11-03 ENCOUNTER — Other Ambulatory Visit: Payer: Self-pay

## 2023-11-03 DIAGNOSIS — R112 Nausea with vomiting, unspecified: Secondary | ICD-10-CM | POA: Diagnosis present

## 2023-11-03 DIAGNOSIS — F1290 Cannabis use, unspecified, uncomplicated: Secondary | ICD-10-CM | POA: Diagnosis not present

## 2023-11-03 DIAGNOSIS — R1010 Upper abdominal pain, unspecified: Secondary | ICD-10-CM | POA: Diagnosis not present

## 2023-11-03 LAB — BASIC METABOLIC PANEL
Anion gap: 18 — ABNORMAL HIGH (ref 5–15)
BUN: <5 mg/dL — ABNORMAL LOW (ref 6–20)
CO2: 16 mmol/L — ABNORMAL LOW (ref 22–32)
Calcium: 8.3 mg/dL — ABNORMAL LOW (ref 8.9–10.3)
Chloride: 99 mmol/L (ref 98–111)
Creatinine, Ser: 0.71 mg/dL (ref 0.44–1.00)
GFR, Estimated: >60 mL/min (ref >60)
Glucose, Bld: 117 mg/dL — ABNORMAL HIGH (ref 70–99)
Potassium: 3.9 mmol/L (ref 3.5–5.1)
Sodium: 133 mmol/L — ABNORMAL LOW (ref 135–145)

## 2023-11-03 LAB — CBC
HCT: 38.5 % (ref 36.0–46.0)
Hemoglobin: 12.2 g/dL (ref 12.0–15.0)
MCH: 32.4 pg (ref 26.0–34.0)
MCHC: 31.7 g/dL (ref 30.0–36.0)
MCV: 102.4 fL — ABNORMAL HIGH (ref 80.0–100.0)
Platelets: 127 10*3/uL — ABNORMAL LOW (ref 150–400)
RBC: 3.76 MIL/uL — ABNORMAL LOW (ref 3.87–5.11)
RDW: 13.5 % (ref 11.5–15.5)
WBC: 10.5 10*3/uL (ref 4.0–10.5)
nRBC: 0 % (ref 0.0–0.2)

## 2023-11-03 LAB — URINALYSIS, ROUTINE W REFLEX MICROSCOPIC
Bilirubin Urine: NEGATIVE
Glucose, UA: NEGATIVE mg/dL
Ketones, ur: >80 mg/dL — AB
Leukocytes,Ua: NEGATIVE
Nitrite: NEGATIVE
Protein, ur: 30 mg/dL — AB
Specific Gravity, Urine: 1.023 (ref 1.005–1.030)
pH: 6 (ref 5.0–8.0)

## 2023-11-03 LAB — RAPID URINE DRUG SCREEN, HOSP PERFORMED
Amphetamines: NOT DETECTED
Barbiturates: NOT DETECTED
Benzodiazepines: NOT DETECTED
Cocaine: NOT DETECTED
Opiates: NOT DETECTED
Tetrahydrocannabinol: POSITIVE — AB

## 2023-11-03 LAB — LIPASE, BLOOD: Lipase: 42 U/L (ref 11–51)

## 2023-11-03 LAB — LACTIC ACID, PLASMA
Lactic Acid, Venous: 5.3 mmol/L (ref 0.5–1.9)
Lactic Acid, Venous: 3.1 mmol/L (ref 0.5–1.9)

## 2023-11-03 LAB — PREGNANCY, URINE: Preg Test, Ur: NEGATIVE

## 2023-11-03 LAB — MAGNESIUM: Magnesium: 1.7 mg/dL (ref 1.7–2.4)

## 2023-11-03 MED ORDER — SODIUM CHLORIDE 0.9 % IV BOLUS
1000.0000 mL | Freq: Once | INTRAVENOUS | Status: AC
  Administered 2023-11-03: 1000 mL via INTRAVENOUS

## 2023-11-03 MED ORDER — LACTATED RINGERS IV BOLUS
1000.0000 mL | Freq: Once | INTRAVENOUS | Status: AC
  Administered 2023-11-03: 1000 mL via INTRAVENOUS

## 2023-11-03 MED ORDER — FAMOTIDINE IN NACL 20-0.9 MG/50ML-% IV SOLN
20.0000 mg | Freq: Once | INTRAVENOUS | Status: AC
Start: 2023-11-03 — End: 2023-11-03
  Administered 2023-11-03: 20 mg via INTRAVENOUS
  Filled 2023-11-03 (×2): qty 50

## 2023-11-03 MED ORDER — PANTOPRAZOLE SODIUM 40 MG IV SOLR
40.0000 mg | Freq: Once | INTRAVENOUS | Status: AC
  Administered 2023-11-03: 40 mg via INTRAVENOUS
  Filled 2023-11-03: qty 10

## 2023-11-03 MED ORDER — FENTANYL CITRATE PF 50 MCG/ML IJ SOSY: 50.0000 ug | PREFILLED_SYRINGE | Freq: Once | INTRAMUSCULAR | Status: DC

## 2023-11-03 MED ORDER — ONDANSETRON HCL 4 MG/2ML IJ SOLN
4.0000 mg | Freq: Once | INTRAMUSCULAR | Status: AC
  Administered 2023-11-03: 4 mg via INTRAVENOUS
  Filled 2023-11-03: qty 2

## 2023-11-03 MED ORDER — PROMETHAZINE HCL 25 MG PO TABS: 25.0000 mg | ORAL_TABLET | Freq: Three times a day (TID) | ORAL | 0 refills | Status: DC | PRN

## 2023-11-03 MED ORDER — SUCRALFATE 1 G PO TABS: 1.0000 g | ORAL_TABLET | Freq: Three times a day (TID) | ORAL | 0 refills | Status: DC

## 2023-11-03 MED ORDER — METOCLOPRAMIDE HCL 5 MG/ML IJ SOLN
10.0000 mg | Freq: Once | INTRAMUSCULAR | Status: AC
  Administered 2023-11-03: 10 mg via INTRAVENOUS
  Filled 2023-11-03: qty 2

## 2023-11-03 NOTE — ED Triage Notes (Signed)
Pt. C/o vomiting x 3 days. Seen here 11/02/2023 for same.

## 2023-11-03 NOTE — ED Provider Notes (Signed)
[place] EMERGENCY DEPARTMENT AT [facility] Provider Note   CSN: [id number] Arrival date & time: 11/03/23  0441     History  Chief Complaint  Patient presents with   Emesis    Deborah Juarez is a 32 y.o. female.  Patient returns with upper abdominal pain and intractable nausea and vomiting.  She was seen yesterday for the same and discharged with prescriptions for Protonix and Pepcid. No antiemetics were given.  States she has had persistent vomiting ever since returning home and is not able to keep anything down.  Emesis is nonbloody and nonbilious.  No diarrhea.  Associated with upper abdominal pain.  No fever.  Has had issues with vomiting in the past that required admission to the hospital.  Possible cannabinoid hyperemesis syndrome.  Last used marijuana about a week ago.  Still has appendix and gallbladder.  No pain with urination or blood in the urine.  No vaginal bleeding or discharge.  She had a CT scan that showed evidence of esophagitis as well as gallstones.  She was referred to GI and general surgery but has not seen them yet.  She apparently has also had pancreatitis in the past.  The history is provided by the patient.  Emesis Associated symptoms: abdominal pain   Associated symptoms: no arthralgias, no cough, no diarrhea, no fever, no headaches and no myalgias        Home Medications Prior to Admission medications   Medication Sig Start Date End Date Taking? Authorizing Provider  Aspirin-Salicylamide-Caffeine (BC HEADACHE POWDER PO) Take 1 Package by mouth 2 (two) times daily as needed (Pain).    [provider]  dicyclomine (BENTYL) 20 MG tablet Take 1 tablet (20 mg total) by mouth 3 (three) times daily as needed for up to 3 days for spasms. 06/05/23 06/09/23  Gowens, Mariah L, PA-C  famotidine (PEPCID) 40 MG tablet Take 1 tablet (40 mg total) by mouth daily. 11/02/23 12/02/23  Ernie Avena, MD  hydrOXYzine (VISTARIL) 50 MG capsule Take  50 mg by mouth every 8 (eight) hours as needed for anxiety. 01/23/23   [provider]  loperamide (IMODIUM) 2 MG capsule Take 1 capsule (2 mg total) by mouth every 6 (six) hours as needed for diarrhea or loose stools. 04/21/23   Glade Lloyd, MD  medroxyPROGESTERone Acetate 150 MG/ML SUSY Inject 1 mL (150 mg total) into the muscle every 3 (three) months. 08/27/23     pantoprazole (PROTONIX) 40 MG tablet Take 1 tablet (40 mg total) by mouth daily. 11/02/23 12/02/23  Ernie Avena, MD  sertraline (ZOLOFT) 100 MG tablet Take 100 mg by mouth daily. 02/20/23   [provider]      Allergies    Patient has no known allergies.    Review of Systems   Review of Systems  Constitutional:  Positive for activity change and appetite change. Negative for fever.  HENT:  Negative for congestion and rhinorrhea.   Respiratory:  Negative for cough, chest tightness and shortness of breath.   Cardiovascular:  Negative for chest pain.  Gastrointestinal:  Positive for abdominal pain, nausea and vomiting. Negative for diarrhea.  Genitourinary:  Negative for dysuria and hematuria.  Musculoskeletal:  Negative for arthralgias and myalgias.  Neurological:  Negative for dizziness, weakness and headaches.   all other systems are negative except as noted in the HPI and PMH.     Physical Exam Updated Vital Signs BP (!) 137/96   Pulse 88   Temp 98.4 F (36.9  C) (Oral)   Resp 17   SpO2 100%  Physical Exam Vitals and nursing note reviewed.  Constitutional:      General: She is not in acute distress.    Appearance: She is well-developed.  HENT:     Head: Normocephalic and atraumatic.     Mouth/Throat:     Pharynx: No oropharyngeal exudate.  Eyes:     Conjunctiva/sclera: Conjunctivae normal.     Pupils: Pupils are equal, round, and reactive to light.  Neck:     Comments: No meningismus. Cardiovascular:     Rate and Rhythm: Normal rate and regular rhythm.     Heart sounds: Normal heart sounds.  No murmur heard. Pulmonary:     Effort: Pulmonary effort is normal. No respiratory distress.     Breath sounds: Normal breath sounds.  Abdominal:     Palpations: Abdomen is soft.     Tenderness: There is abdominal tenderness. There is no guarding or rebound.     Comments: Epigastric tenderness, no guarding or rebound  Musculoskeletal:        General: No tenderness. Normal range of motion.     Cervical back: Normal range of motion and neck supple.  Skin:    General: Skin is warm.  Neurological:     Mental Status: She is alert and oriented to person, place, and time.     Cranial Nerves: No cranial nerve deficit.     Motor: No abnormal muscle tone.     Coordination: Coordination normal.     Comments:  5/5 strength throughout. CN 2-12 intact.Equal grip strength.   Psychiatric:        Behavior: Behavior normal.     ED Results / Procedures / Treatments   Labs (all labs ordered are listed, but only abnormal results are displayed) Labs Reviewed  COMPREHENSIVE METABOLIC PANEL - Abnormal; Notable for the following components:      Result Value   Sodium 132 (*)    CO2 14 (*)    Glucose, Bld 138 (*)    BUN <5 (*)    Calcium 8.4 (*)    AST 140 (*)    ALT 54 (*)    Total Bilirubin 1.2 (*)    Anion gap 20 (*)    All other components within normal limits  CBC - Abnormal; Notable for the following components:   RBC 3.76 (*)    MCV 102.4 (*)    Platelets 127 (*)    All other components within normal limits  LIPASE, BLOOD  MAGNESIUM  URINALYSIS, ROUTINE W REFLEX MICROSCOPIC  PREGNANCY, URINE  BASIC METABOLIC PANEL  LACTIC ACID, PLASMA  LACTIC ACID, PLASMA  RAPID URINE DRUG SCREEN, HOSP PERFORMED    EKG None  Radiology CT ABDOMEN PELVIS W CONTRAST Result Date: 11/02/2023 CLINICAL DATA:  Left upper quadrant pain for several days. Nausea and vomiting. EXAM: CT ABDOMEN AND PELVIS WITH CONTRAST TECHNIQUE: Multidetector CT imaging of the abdomen and pelvis was performed using the  standard protocol following bolus administration of intravenous contrast. RADIATION DOSE REDUCTION: This exam was performed according to the departmental dose-optimization program which includes automated exposure control, adjustment of the mA and/or kV according to patient size and/or use of iterative reconstruction technique. CONTRAST:  80mL OMNIPAQUE IOHEXOL 300 MG/ML  SOLN COMPARISON:  None Available. FINDINGS: Lower Chest: No acute findings. Hepatobiliary: No suspicious hepatic masses identified. Moderate to severe diffuse hepatic steatosis noted. A few tiny gallstones are noted. No No evidence of cholecystitis or biliary ductal  dilatation. Pancreas:  No mass or inflammatory changes. Spleen: Within normal limits in size and appearance. Adrenals/Urinary Tract: No suspicious masses identified. Several tiny renal calculi are noted bilaterally. No evidence of ureteral calculi or hydronephrosis. Unremarkable unopacified urinary bladder. Stomach/Bowel: Wall thickening of distal thoracic esophagus noted, consistent with esophagitis. No evidence of obstruction, inflammatory process or abnormal fluid collections. Normal appendix visualized. Vascular/Lymphatic: No pathologically enlarged lymph nodes. No acute vascular findings. Reproductive:  No mass or other significant abnormality. Other:  None. Musculoskeletal:  No suspicious bone lesions identified. IMPRESSION: Wall thickening of distal thoracic esophagus, consistent with esophagitis. Moderate to severe hepatic steatosis. Cholelithiasis. No radiographic evidence of cholecystitis. Tiny bilateral renal calculi. No evidence of ureteral calculi or hydronephrosis. Electronically Signed   By: Danae Orleans M.D.   On: 11/02/2023 14:46    Procedures Procedures    Medications Ordered in ED Medications  lactated ringers bolus 1,000 mL (has no administration in time range)  ondansetron (ZOFRAN) injection 4 mg (has no administration in time range)  metoCLOPramide  (REGLAN) injection 10 mg (has no administration in time range)  famotidine (PEPCID) IVPB 20 mg premix (has no administration in time range)  pantoprazole (PROTONIX) injection 40 mg (has no administration in time range)    ED Course/ Medical Decision Making/ A&P                                 Medical Decision Making Amount and/or Complexity of Data Reviewed Labs: ordered. Decision-making details documented in ED Course. Radiology: ordered and independent interpretation performed. Decision-making details documented in ED Course. ECG/medicine tests: ordered and independent interpretation performed. Decision-making details documented in ED Course.  Risk Prescription drug management.   Recurrent upper abdominal pain with nausea and vomiting.  Stable vital signs.  No distress.  Abdomen soft without peritoneal signs.   CT from yesterday showed esophagitis as well as cholelithiasis.  IV fluids, antiemetics given.  hCG is negative from yesterday.  No significant leukocytosis.  Acidosis is worse with bicarb down to 14 anion gap of 20. LFTs slightly worse than previous.  Lipase has normalized.  Abdomen soft without peritoneal signs.  CT scan from yesterday reviewed.  No acute abdomen today.  Admission discussed with patient.  She much prefer to go home.  Will recheck her electrolytes and anion gap after hydration.  Discussed cessation of marijuana use.  If her vomiting remains intractable and her acidosis persists she may require admission.  Additional IV fluids and medications pending at shift change.  Will need recheck of her electrolytes and anion gap and p.o. challenge. D/w Dr. Wallace Cullens.        Final Clinical Impression(s) / ED Diagnoses Final diagnoses:  Nausea and vomiting, unspecified vomiting type    Rx / DC Orders ED Discharge Orders     None         Taneasha Fuqua, Jeannett Senior, MD 11/03/23 [phone]

## 2023-11-03 NOTE — Discharge Instructions (Addendum)
Stop using marijuana.  Start with clear liquid diet and advance slowly as tolerated.  Follow-up with your primary doctor as well as the gastroenterologist.  Return to the ED with new or worsening symptoms.   It was a pleasure caring for you today in the emergency department.  Please return to the emergency department for any worsening or worrisome symptoms.

## 2023-11-03 NOTE — ED Notes (Signed)
CRITICAL VALUE STICKER  CRITICAL VALUE:Lactate 5.3  RECEIVER (on-site recipient of call):Carmon Ginsberg, RN  DATE & TIME NOTIFIED:   MESSENGER (representative from lab):  MD NOTIFIED: S Gray  TIME OF NOTIFICATION:0826  RESPONSE:

## 2023-11-03 NOTE — ED Notes (Signed)
Dc instructions reviewed with patient. Patient voiced understanding. Dc with belongings.  °

## 2023-11-03 NOTE — ED Provider Notes (Signed)
  Provider Note MRN:  [id number]  Arrival date & time: 11/03/23    ED Course and Medical Decision Making  Assumed care from [place] at shift change.  See note from prior team for complete details, in brief:  32 yo female  N/v last few days Labs reviewed, bicarb is low, improved on recheck, creatinine stable.  UDS positive for THC.  Lactic acid was elevated, recheck is improved. Symptoms concerning for CHS IV fluids completed, given antiemetics and is feeling much better, she is tolerant p.o. without difficulty.  Feeling much better.  She prefer discharge as opposed to admission.  Recommend THC cessation  Clinical Course as of 11/03/23 1051  Sat Nov 03, 2023  0829 Tetrahydrocannabinol(!): POSITIVE Hx similar [SG]  0829 Lactic Acid, Venous(!!): 5.3 Her exam is stable, likely from vomiting, her vomiting has resolved, will give some more fluids, she is tolerating po, will recheck [SG]  0839 CO2(!): 16 Bicarb improved  [SG]  1016 Lactic Acid, Venous(!!): 3.1 LA improved, feeling much better [SG]    Clinical Course User Index [SG] Tanda Rockers A, DO    The patient improved significantly and was discharged in stable condition. Detailed discussions were had with the patient regarding current findings, and need for close f/u with PCP or on call doctor. The patient has been instructed to return immediately if the symptoms worsen in any way for re-evaluation. Patient verbalized understanding and is in agreement with current care plan. All questions answered prior to discharge.     .Critical Care  Performed by: Sloan Leiter, DO Authorized by: Sloan Leiter, DO   Critical care provider statement:    Critical care time (minutes):  30   Critical care time was exclusive of:  Separately billable procedures and treating other patients   Critical care was necessary to treat or prevent imminent or life-threatening deterioration of the following conditions:  Dehydration   Critical care was time  spent personally by me on the following activities:  Development of treatment plan with patient or surrogate, evaluation of patient's response to treatment, examination of patient, ordering and review of laboratory studies, ordering and review of radiographic studies, ordering and performing treatments and interventions, pulse oximetry, re-evaluation of patient's condition, review of old charts and obtaining history from patient or surrogate   Final Clinical Impressions(s) / ED Diagnoses     ICD-10-CM   1. Nausea and vomiting, unspecified vomiting type  R11.2     2. Marijuana use  F12.90       ED Discharge Orders          Ordered    promethazine (PHENERGAN) 25 MG tablet  Every 8 hours PRN        11/03/23 0712    sucralfate (CARAFATE) 1 g tablet  3 times daily with meals        11/03/23 1046              Discharge Instructions      Stop using marijuana.  Start with clear liquid diet and advance slowly as tolerated.  Follow-up with your primary doctor as well as the gastroenterologist.  Return to the ED with new or worsening symptoms.   It was a pleasure caring for you today in the emergency department.  Please return to the emergency department for any worsening or worrisome symptoms.          Tanda Rockers A, DO 11/03/23 1051

## 2023-11-05 LAB — COMPREHENSIVE METABOLIC PANEL
ALT: 54 U/L — ABNORMAL HIGH (ref 0–44)
AST: 140 U/L — ABNORMAL HIGH (ref 15–41)
Albumin: 4.6 g/dL (ref 3.5–5.0)
Alkaline Phosphatase: 52 U/L (ref 38–126)
Anion gap: 20 — ABNORMAL HIGH (ref 5–15)
BUN: <5 mg/dL — ABNORMAL LOW (ref 6–20)
CO2: 14 mmol/L — ABNORMAL LOW (ref 22–32)
Calcium: 8.4 mg/dL — ABNORMAL LOW (ref 8.9–10.3)
Chloride: 98 mmol/L (ref 98–111)
Creatinine, Ser: 0.78 mg/dL (ref 0.44–1.00)
GFR, Estimated: >60 mL/min (ref >60)
Glucose, Bld: 138 mg/dL — ABNORMAL HIGH (ref 70–99)
Potassium: 4.2 mmol/L (ref 3.5–5.1)
Sodium: 132 mmol/L — ABNORMAL LOW (ref 135–145)
Total Bilirubin: 1.2 mg/dL — ABNORMAL HIGH (ref <1.2)
Total Protein: 8.1 g/dL (ref 6.5–8.1)

## 2023-11-27 ENCOUNTER — Other Ambulatory Visit: Payer: Self-pay

## 2024-01-17 ENCOUNTER — Other Ambulatory Visit: Payer: Self-pay

## 2024-01-17 ENCOUNTER — Encounter (HOSPITAL_COMMUNITY): Payer: Self-pay | Admitting: Family Medicine

## 2024-01-17 ENCOUNTER — Emergency Department (HOSPITAL_COMMUNITY): Payer: Medicaid Other

## 2024-01-17 ENCOUNTER — Observation Stay (HOSPITAL_COMMUNITY)
Admission: EM | Admit: 2024-01-17 | Discharge: 2024-01-18 | Disposition: A | Discharge status: Home / Self Care | Payer: Medicaid Other | Attending: Family Medicine | Admitting: Family Medicine

## 2024-01-17 DIAGNOSIS — R112 Nausea with vomiting, unspecified: Secondary | ICD-10-CM | POA: Insufficient documentation

## 2024-01-17 DIAGNOSIS — E872 Acidosis, unspecified: Principal | ICD-10-CM

## 2024-01-17 DIAGNOSIS — F10920 Alcohol use, unspecified with intoxication, uncomplicated: Secondary | ICD-10-CM | POA: Insufficient documentation

## 2024-01-17 DIAGNOSIS — F419 Anxiety disorder, unspecified: Secondary | ICD-10-CM | POA: Diagnosis not present

## 2024-01-17 DIAGNOSIS — F32A Depression, unspecified: Secondary | ICD-10-CM | POA: Insufficient documentation

## 2024-01-17 DIAGNOSIS — E8729 Other acidosis: Secondary | ICD-10-CM | POA: Diagnosis not present

## 2024-01-17 DIAGNOSIS — Z794 Long term (current) use of insulin: Secondary | ICD-10-CM | POA: Diagnosis not present

## 2024-01-17 DIAGNOSIS — T730XXA Starvation, initial encounter: Principal | ICD-10-CM | POA: Insufficient documentation

## 2024-01-17 LAB — COMPREHENSIVE METABOLIC PANEL
ALT: 63 U/L — ABNORMAL HIGH (ref 0–44)
AST: 76 U/L — ABNORMAL HIGH (ref 15–41)
Albumin: 4.7 g/dL (ref 3.5–5.0)
Alkaline Phosphatase: 46 U/L (ref 38–126)
Anion gap: 28 — ABNORMAL HIGH (ref 5–15)
BUN: <5 mg/dL — ABNORMAL LOW (ref 6–20)
CO2: 9 mmol/L — ABNORMAL LOW (ref 22–32)
Calcium: 9.1 mg/dL (ref 8.9–10.3)
Chloride: 101 mmol/L (ref 98–111)
Creatinine, Ser: 0.91 mg/dL (ref 0.44–1.00)
GFR, Estimated: >60 mL/min (ref >60)
Glucose, Bld: 71 mg/dL (ref 70–99)
Potassium: 4.3 mmol/L (ref 3.5–5.1)
Sodium: 138 mmol/L (ref 135–145)
Total Bilirubin: 1.2 mg/dL (ref 0.0–1.2)
Total Protein: 8.3 g/dL — ABNORMAL HIGH (ref 6.5–8.1)

## 2024-01-17 LAB — I-STAT VENOUS BLOOD GAS, ED
Acid-base deficit: 15 mmol/L — ABNORMAL HIGH (ref 0.0–2.0)
Bicarbonate: 8.9 mmol/L — ABNORMAL LOW (ref 20.0–28.0)
Calcium, Ion: 1.01 mmol/L — ABNORMAL LOW (ref 1.15–1.40)
HCT: 39 % (ref 36.0–46.0)
Hemoglobin: 13.3 g/dL (ref 12.0–15.0)
O2 Saturation: 92 %
Potassium: 4 mmol/L (ref 3.5–5.1)
Sodium: 137 mmol/L (ref 135–145)
TCO2: 9 mmol/L — ABNORMAL LOW (ref 22–32)
pCO2, Ven: 18.6 mmHg — CL (ref 44–60)
pH, Ven: 7.29 (ref 7.25–7.43)
pO2, Ven: 68 mmHg — ABNORMAL HIGH (ref 32–45)

## 2024-01-17 LAB — CBC WITH DIFFERENTIAL/PLATELET
Abs Immature Granulocytes: 0.08 10*3/uL — ABNORMAL HIGH (ref 0.00–0.07)
Basophils Absolute: 0.1 10*3/uL (ref 0.0–0.1)
Basophils Relative: 1 %
Eosinophils Absolute: 0 10*3/uL (ref 0.0–0.5)
Eosinophils Relative: 0 %
HCT: 35.8 % — ABNORMAL LOW (ref 36.0–46.0)
Hemoglobin: 11.4 g/dL — ABNORMAL LOW (ref 12.0–15.0)
Immature Granulocytes: 1 %
Lymphocytes Relative: 13 %
Lymphs Abs: 1.5 10*3/uL (ref 0.7–4.0)
MCH: 33.5 pg (ref 26.0–34.0)
MCHC: 31.8 g/dL (ref 30.0–36.0)
MCV: 105.3 fL — ABNORMAL HIGH (ref 80.0–100.0)
Monocytes Absolute: 1 10*3/uL (ref 0.1–1.0)
Monocytes Relative: 9 %
Neutro Abs: 8.5 10*3/uL — ABNORMAL HIGH (ref 1.7–7.7)
Neutrophils Relative %: 76 %
Platelets: 209 10*3/uL (ref 150–400)
RBC: 3.4 MIL/uL — ABNORMAL LOW (ref 3.87–5.11)
RDW: 14.2 % (ref 11.5–15.5)
WBC: 11.1 10*3/uL — ABNORMAL HIGH (ref 4.0–10.5)
nRBC: 0 % (ref 0.0–0.2)

## 2024-01-17 LAB — I-STAT CG4 LACTIC ACID, ED
Lactic Acid, Venous: 8.7 mmol/L (ref 0.5–1.9)
Lactic Acid, Venous: 4.7 mmol/L (ref 0.5–1.9)

## 2024-01-17 LAB — TROPONIN I (HIGH SENSITIVITY)
Troponin I (High Sensitivity): 3 ng/L (ref <18)
Troponin I (High Sensitivity): 3 ng/L (ref <18)

## 2024-01-17 LAB — ETHANOL: Alcohol, Ethyl (B): 25 mg/dL — ABNORMAL HIGH (ref <10)

## 2024-01-17 LAB — LIPASE, BLOOD: Lipase: 40 U/L (ref 11–51)

## 2024-01-17 LAB — BETA-HYDROXYBUTYRIC ACID: Beta-Hydroxybutyric Acid: 6.78 mmol/L — ABNORMAL HIGH (ref 0.05–0.27)

## 2024-01-17 LAB — CBG MONITORING, ED: Glucose-Capillary: 76 mg/dL (ref 70–99)

## 2024-01-17 LAB — MAGNESIUM: Magnesium: 1.8 mg/dL (ref 1.7–2.4)

## 2024-01-17 LAB — HCG, SERUM, QUALITATIVE: Preg, Serum: NEGATIVE

## 2024-01-17 MED ORDER — ENSURE ENLIVE PO LIQD: 237.0000 mL | Freq: Two times a day (BID) | ORAL | Status: DC

## 2024-01-17 MED ORDER — HEPARIN SODIUM (PORCINE) 5000 UNIT/ML IJ SOLN
5000.0000 [IU] | Freq: Three times a day (TID) | INTRAMUSCULAR | Status: DC
  Administered 2024-01-17 – 2024-01-18 (×2): 5000 [IU] via SUBCUTANEOUS
  Filled 2024-01-17 (×2): qty 1

## 2024-01-17 MED ORDER — ONDANSETRON HCL 4 MG/2ML IJ SOLN: 4.0000 mg | Freq: Four times a day (QID) | INTRAMUSCULAR | Status: DC | PRN

## 2024-01-17 MED ORDER — LACTATED RINGERS IV BOLUS
1000.0000 mL | Freq: Once | INTRAVENOUS | Status: AC
  Administered 2024-01-17: 1000 mL via INTRAVENOUS

## 2024-01-17 MED ORDER — SODIUM BICARBONATE 8.4 % IV SOLN
INTRAVENOUS | Status: DC
  Filled 2024-01-17 (×2): qty 1000
  Filled 2024-01-17: qty 150

## 2024-01-17 MED ORDER — METOCLOPRAMIDE HCL 5 MG/ML IJ SOLN
10.0000 mg | Freq: Once | INTRAMUSCULAR | Status: AC
  Administered 2024-01-17: 10 mg via INTRAVENOUS
  Filled 2024-01-17: qty 2

## 2024-01-17 MED ORDER — SUCRALFATE 1 GM/10ML PO SUSP
1.0000 g | Freq: Three times a day (TID) | ORAL | Status: DC
  Administered 2024-01-17 – 2024-01-18 (×3): 1 g via ORAL
  Filled 2024-01-17 (×5): qty 10

## 2024-01-17 MED ORDER — PANTOPRAZOLE SODIUM 40 MG IV SOLR
40.0000 mg | Freq: Two times a day (BID) | INTRAVENOUS | Status: DC
  Administered 2024-01-17: 40 mg via INTRAVENOUS
  Filled 2024-01-17: qty 10

## 2024-01-17 MED ORDER — METOCLOPRAMIDE HCL 5 MG/ML IJ SOLN
5.0000 mg | Freq: Three times a day (TID) | INTRAMUSCULAR | Status: DC
  Administered 2024-01-17 – 2024-01-18 (×2): 5 mg via INTRAVENOUS
  Filled 2024-01-17 (×2): qty 2

## 2024-01-17 MED ORDER — LACTATED RINGERS IV BOLUS
1000.0000 mL | Freq: Once | INTRAVENOUS | Status: AC
Start: 2024-01-17 — End: 2024-01-17
  Administered 2024-01-17: 1000 mL via INTRAVENOUS

## 2024-01-17 MED ORDER — ORAL CARE MOUTH RINSE: 15.0000 mL | OROMUCOSAL | Status: DC | PRN

## 2024-01-17 MED ORDER — LORAZEPAM 2 MG/ML IJ SOLN
1.0000 mg | Freq: Once | INTRAMUSCULAR | Status: AC
  Administered 2024-01-17: 1 mg via INTRAVENOUS
  Filled 2024-01-17: qty 1

## 2024-01-17 MED ORDER — MELATONIN 3 MG PO TABS
6.0000 mg | ORAL_TABLET | Freq: Every evening | ORAL | Status: DC | PRN
  Administered 2024-01-18: 6 mg via ORAL
  Filled 2024-01-17: qty 2

## 2024-01-17 MED ORDER — THIAMINE HCL 100 MG/ML IJ SOLN
100.0000 mg | Freq: Every day | INTRAMUSCULAR | Status: DC
  Administered 2024-01-17: 100 mg via INTRAVENOUS
  Filled 2024-01-17: qty 2

## 2024-01-17 MED ORDER — ONDANSETRON HCL 4 MG PO TABS: 4.0000 mg | ORAL_TABLET | Freq: Four times a day (QID) | ORAL | Status: DC | PRN

## 2024-01-17 NOTE — ED Triage Notes (Signed)
 Pt bib em  from home; c/o N/V x 1 day; cbg 70, hx dm; endorses hx of same; found on floor yesterday, syncopal episode dafter vomiting; 4mg  zofran given pta, 500 cc ns, 20 ga RW; relief with zofran;does not take DM meds; 140/60, HR 90s; ems states pt "has a lot of stuff going on at home and would like to speak with someone"; endorses depression, denies SI/HI; c/o burning in chest and stomach, numbness, cold, and cramping to bilateral feet and toes; HX GERD, gallstones

## 2024-01-17 NOTE — H&P (Signed)
 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  10-12 times clear emesis at the end nothing really coming up-no associated fever chills rigor sick ill contacts No associated diarrhea Last alcoholic drink 2/24-has been drinking more lately since past 5 months since break-up with husband-he moved out 5 months prior Also endorsing depression which is worsened situationally 2/2 her having to commit her niece last week, patient's brother being lost/missing last year and then patient's father having a stroke and patient feels despondent and cannot cope Seems interested in getting help/counseling and interested in being back on medication   Review of Systems:  As mentioned above in HPI are pertinent +'s Pertinent negatives as per below   ED Course: Emergency room Rx Ativan X 1 mg, Reglan 10, bolus LR 2 L and then started on bicarb 150 at 150 cc/H, Ethanol level pending Was able to tolerate some p.o. liquids    Past Medical History:  Diagnosis Date   Anxiety    Anxiety    Asthma    "long " time since used  rescue inhaler   GERD (gastroesophageal reflux disease)    Post partum depression    Past Surgical History:  Procedure Laterality Date   BREAST SURGERY     CESAREAN SECTION N/A 03/03/2015   Procedure: CESAREAN SECTION;  Surgeon: Myna Hidalgo, DO;  Location: [facility];  Service: Obstetrics;  Laterality: N/A;    reports that she has never smoked. She has never used smokeless tobacco. She reports that she does not currently use alcohol after a past usage of about 3.0 standard drinks of alcohol per week. She reports that she does not use drugs. 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 Authorizing Provider  Aspirin-Salicylamide-Caffeine (BC HEADACHE POWDER PO) Take 1 Package by mouth 2 (two) times daily as needed (Pain).    [provider]  dicyclomine (BENTYL) 20 MG tablet Take 1 tablet (20 mg total) by mouth 3 (three) times daily as needed for up to 3 days for spasms. 06/05/23 06/09/23  Gowens, Mariah L, PA-C  famotidine (PEPCID) 40 MG tablet  Take 1 tablet (40 mg total) by mouth daily. 11/02/23 12/02/23  Ernie Avena, MD  hydrOXYzine (VISTARIL) 50 MG capsule Take 50 mg by mouth every 8 (eight) hours as needed for anxiety. 01/23/23   [provider]  loperamide (IMODIUM) 2 MG capsule Take 1 capsule (2 mg total) by mouth every 6 (six) hours as needed for diarrhea or loose stools. 04/21/23   Glade Lloyd, MD  medroxyPROGESTERone Acetate 150 MG/ML SUSY  Inject 1 mL (150 mg total) into the muscle every 3 (three) months. 08/27/23     pantoprazole (PROTONIX) 40 MG tablet Take 1 tablet (40 mg total) by mouth daily. 11/02/23 12/02/23  Ernie Avena, MD  promethazine (PHENERGAN) 25 MG tablet Take 1 tablet (25 mg total) by mouth every 8 (eight) hours as needed for nausea or vomiting. 11/03/23   Rancour, Jeannett Senior, MD  sertraline (ZOLOFT) 100 MG tablet Take 100 mg by mouth daily. 02/20/23   [provider]  sucralfate (CARAFATE) 1 g tablet Take 1 tablet (1 g total) by mouth with breakfast, with lunch, and with evening meal for 7 days. 11/03/23 11/10/23  Sloan Leiter, DO    Physical Exam:  Vitals:   01/17/24 1139 01/17/24 1445  BP: (!) 140/96 (!) 134/94  Pulse: (!) 107 81  Resp: 20 18  Temp: 97.8 F (36.6 C)   SpO2: 100% 99%    Anxious appearing black female no distress no icterus no pallor Multiple piercings-tattoos S1-S2 tachycardic regular rhythm rate increased above 100 Chest is clear no wheeze rales rhonchi ROM is intact Power 5/5 grossly smile symmetric follows and tracks reasonably with with eyes  I have personally reviewed following labs and imaging studies  Labs:   CO2 9  sodium 138 potassium 4.3 BUN/creatinine 5/0.9 lipase 40 AST ALT 76/63 bilirubin 1.2 WBC 11.1 hemoglobin 11.4 platelet 209   hCG negative  Imaging studies:  CXR unremarkable  Medical tests:  EKG independently reviewed: Sinus tachycardia slightly rightward shift to axis PR interval 0.08 no ST-T wave depressions  Test discussed with performing physician: Yes discussed with the Dr. Cherylynn Ridges  Decision to obtain old records:  Yes  Review and summation of old records:  Yes  Principal Problem:   Starvation ketoacidosis   Assessment/Plan  Starvation/alcoholic ketosis  beta hydroxybutyrate is elevated-continue LR 2 L complete the boluses Start bicarb 150 cc/H with 150 mEq-expect will improve with IV fluid repletion Do not suspect any another  ingestion of solvent etc.  Nausea vomiting Likely secondary to hyperemesis cannabinoid syndrome with reflux underlying and irritation from continued EtOH Will start Protonix IV 40 twice daily for now and when able to reliably take p.o. likely can switch to that and she will need outpatient GI eval given underlying cholelithiasis and need for follow-up We will also add Carafate 1 g 3 times daily meals and I will place her on a soft diet Would continue Reglan 5 IV every 12 for now  Situational depression/anxiety Once able to take p.o. we will resume sertraline 100 hydroxyzine 50 Q8 and likely will discharge her with at least a 27-month supply until she can get in with PCP  Alcoholism situational Watch for withdrawal symptoms-if starts to do so may need to place on CIWA protocol would hold for now  Severity of Illness: The appropriate patient status for this patient is INPATIENT. Inpatient status is judged to be reasonable and necessary in order to provide the required intensity of service to ensure the patient's safety. The patient's presenting symptoms,  physical exam findings, and initial radiographic and laboratory data in the context of their chronic comorbidities is felt to place them at high risk for further clinical deterioration. Furthermore, it is not anticipated that the patient will be medically stable for discharge from the hospital within 2 midnights of admission.   * I certify that at the point of admission it is my clinical judgment that the patient will require inpatient hospital care spanning beyond 2 midnights from the point of admission due to high intensity of service, high risk for further deterioration and high frequency of surveillance required.*   Family Communication: None present  DVT ppx: Heparin Consults called & Whom: None  Time spent: 50 minutes  Mahala Menghini, MD Cordelia Poche my NP partners at night for Care related issues] [facility] --[employer] [employer] [facility] ,  [email or web address]; password [facility]  01/17/2024, 4:13 PM

## 2024-01-17 NOTE — Hospital Course (Signed)
 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

## 2024-01-17 NOTE — ED Provider Notes (Signed)
 [place] EMERGENCY DEPARTMENT AT [facility] Provider Note   CSN: [id number] Arrival date & time: 01/17/24  1132     History  No chief complaint on file.   Deborah Juarez is a 33 y.o. female.  HPI 33 year old female present with vomiting.  For the past 48 hours or so she has had intractable vomiting and has been unable to keep anything down.  She has had lower abdominal and epigastric abdominal discomfort.  This is pretty typical for her whenever her stress causes her to have vomiting.  She also ate an edible a few days ago.  Denies fevers.  No diarrhea.  Her husband, who is currently moved out of the house, found her on the ground after she had had vomiting.  She thinks she might of passed out but was already laying on the ground due to how bad she was feeling and did not fall.  Patient also has a lot of stress going on her life which he thinks is both contributing to this but also causing her to have a lot of anxiety.  She feels depressed and used to be on an SSRI but recently stopped.  However she wonders if she needs to go back on it.  She does not feel suicidal but has a lot going on with family issues.  Home Medications Prior to Admission medications   Medication Sig Start Date End Date Taking? Authorizing Provider  famotidine (PEPCID) 40 MG tablet Take 1 tablet (40 mg total) by mouth daily. Patient taking differently: Take 40 mg by mouth daily as needed for heartburn or indigestion. 11/02/23 02/23/24 Yes Ernie Avena, MD  ibuprofen (ADVIL) 200 MG tablet Take 400 mg by mouth 2 (two) times daily as needed for moderate pain (pain score 4-6) or headache.   Yes [provider]  medroxyPROGESTERone Acetate 150 MG/ML SUSY Inject 1 mL (150 mg total) into the muscle every 3 (three) months. 08/27/23  Yes   promethazine (PHENERGAN) 25 MG tablet Take 1 tablet (25 mg total) by mouth every 8 (eight) hours as needed for nausea or vomiting. 11/03/23  Yes Rancour,  Jeannett Senior, MD      Allergies    Protonix [pantoprazole]    Review of Systems   Review of Systems  Constitutional:  Negative for fever.  Respiratory:  Negative for shortness of breath.   Cardiovascular:  Negative for chest pain.  Gastrointestinal:  Positive for abdominal pain, nausea and vomiting.  Psychiatric/Behavioral:  Positive for dysphoric mood and sleep disturbance. Negative for suicidal ideas. The patient is nervous/anxious.     Physical Exam Updated Vital Signs BP (!) 134/94   Pulse 81   Temp 97.8 F (36.6 C)   Resp 18   Wt 61.2 kg   SpO2 99%   BMI 21.14 kg/m  Physical Exam Vitals and nursing note reviewed.  Constitutional:      General: She is not in acute distress.    Appearance: She is well-developed. She is not ill-appearing or diaphoretic.  HENT:     Head: Normocephalic and atraumatic.  Cardiovascular:     Rate and Rhythm: Regular rhythm. Tachycardia present.     Heart sounds: Normal heart sounds.  Pulmonary:     Effort: Pulmonary effort is normal.     Breath sounds: Normal breath sounds.  Abdominal:     Palpations: Abdomen is soft.     Tenderness: There is abdominal tenderness in the epigastric area.  Skin:    General: Skin is warm  and dry.  Neurological:     Mental Status: She is alert.  Psychiatric:        Mood and Affect: Mood is anxious and depressed.        Thought Content: Thought content does not include suicidal ideation.     ED Results / Procedures / Treatments   Labs (all labs ordered are listed, but only abnormal results are displayed) Labs Reviewed  COMPREHENSIVE METABOLIC PANEL - Abnormal; Notable for the following components:      Result Value   CO2 9 (*)    BUN <5 (*)    Total Protein 8.3 (*)    AST 76 (*)    ALT 63 (*)    Anion gap 28 (*)    All other components within normal limits  CBC WITH DIFFERENTIAL/PLATELET - Abnormal; Notable for the following components:   WBC 11.1 (*)    RBC 3.40 (*)    Hemoglobin 11.4 (*)     HCT 35.8 (*)    MCV 105.3 (*)    Neutro Abs 8.5 (*)    Abs Immature Granulocytes 0.08 (*)    All other components within normal limits  BETA-HYDROXYBUTYRIC ACID - Abnormal; Notable for the following components:   Beta-Hydroxybutyric Acid 6.78 (*)    All other components within normal limits  I-STAT CG4 LACTIC ACID, ED - Abnormal; Notable for the following components:   Lactic Acid, Venous 8.7 (*)    All other components within normal limits  I-STAT VENOUS BLOOD GAS, ED - Abnormal; Notable for the following components:   pCO2, Ven 18.6 (*)    pO2, Ven 68 (*)    Bicarbonate 8.9 (*)    TCO2 9 (*)    Acid-base deficit 15.0 (*)    Calcium, Ion 1.01 (*)    All other components within normal limits  LIPASE, BLOOD  HCG, SERUM, QUALITATIVE  MAGNESIUM  ETHANOL  CBG MONITORING, ED  I-STAT CG4 LACTIC ACID, ED  TROPONIN I (HIGH SENSITIVITY)  TROPONIN I (HIGH SENSITIVITY)    EKG EKG Interpretation Date/Time:  Thursday January 17 2024 12:00:28 EST Ventricular Rate:  117 PR Interval:  118 QRS Duration:  76 QT Interval:  356 QTC Calculation: 496 R Axis:   104  Text Interpretation: Sinus tachycardia Biatrial enlargement Rightward axis Nonspecific T wave abnormality Confirmed by Vivi Barrack [phone]) on 01/17/2024 12:44:01 PM  Radiology DG Chest 2 View Result Date: 01/17/2024 CLINICAL DATA:  Chest pain.  Syncopal episode. EXAM: CHEST - 2 VIEW COMPARISON:  Chest radiograph dated Apr 12, 2021. FINDINGS: The heart size and mediastinal contours are within normal limits. Both lungs are clear. No pleural effusion or pneumothorax. No acute osseous abnormality. IMPRESSION: No acute cardiopulmonary findings. Electronically Signed   By: Hart Robinsons M.D.   On: 01/17/2024 15:27    Procedures .Critical Care  Performed by: Pricilla Loveless, MD Authorized by: Pricilla Loveless, MD   Critical care provider statement:    Critical care time (minutes):  35   Critical care time was exclusive of:   Separately billable procedures and treating other patients   Critical care was necessary to treat or prevent imminent or life-threatening deterioration of the following conditions:  Metabolic crisis   Critical care was time spent personally by me on the following activities:  Development of treatment plan with patient or surrogate, discussions with consultants, evaluation of patient's response to treatment, examination of patient, ordering and review of laboratory studies, ordering and review of radiographic studies, ordering and performing  treatments and interventions, pulse oximetry, re-evaluation of patient's condition and review of old charts     Medications Ordered in ED Medications  thiamine (VITAMIN B1) injection 100 mg (has no administration in time range)  sodium bicarbonate 150 mEq in dextrose 5 % 1,150 mL infusion (has no administration in time range)  heparin injection 5,000 Units (has no administration in time range)  ondansetron (ZOFRAN) tablet 4 mg (has no administration in time range)    Or  ondansetron (ZOFRAN) injection 4 mg (has no administration in time range)  pantoprazole (PROTONIX) injection 40 mg (has no administration in time range)  metoCLOPramide (REGLAN) injection 5 mg (has no administration in time range)  sucralfate (CARAFATE) 1 GM/10ML suspension 1 g (has no administration in time range)  lactated ringers bolus 1,000 mL (0 mLs Intravenous Stopped 01/17/24 1545)  LORazepam (ATIVAN) injection 1 mg (1 mg Intravenous Given 01/17/24 1347)  metoCLOPramide (REGLAN) injection 10 mg (10 mg Intravenous Given 01/17/24 1345)  lactated ringers bolus 1,000 mL (1,000 mLs Intravenous New Bag/Given 01/17/24 1608)    ED Course/ Medical Decision Making/ A&P                                 Medical Decision Making Amount and/or Complexity of Data Reviewed Labs: ordered.    Details: Anion gap acidosis with elevated lactate and elevated beta hydroxybutyric acid.  pH  7.29. Radiology: ordered and independent interpretation performed.    Details: No pneumonia ECG/medicine tests: ordered and independent interpretation performed.    Details: Tachycardia.  Risk Prescription drug management. Decision regarding hospitalization.   Patient presents with recurrent vomiting.  Similar to when she was here in December but her numbers are worse.  She has an elevated lactate of 8, metabolic acidosis seems to be compensated mildly, and elevated butyric acid.  She does note that she does drink a fair amount and has been increasing her alcohol intake during the stressful season and so I suspect this might be alcoholic ketoacidosis.  She has been given 2 L of IV fluids and her vitals are improving.  However she will need admission to make sure her gap closes.  She is no longer vomiting and with a recurrent presentation I do not think a CT will be helpful.  Will give her thiamine and we will also start her on bicarbonate with dextrose.  Discussed with Dr. Mahala Menghini for admission.        Final Clinical Impression(s) / ED Diagnoses Final diagnoses:  Metabolic acidosis    Rx / DC Orders ED Discharge Orders     None         Pricilla Loveless, MD 01/17/24 1644

## 2024-01-18 ENCOUNTER — Other Ambulatory Visit (HOSPITAL_COMMUNITY): Payer: Self-pay

## 2024-01-18 ENCOUNTER — Telehealth (HOSPITAL_COMMUNITY): Payer: Self-pay | Admitting: Pharmacy Technician

## 2024-01-18 DIAGNOSIS — T730XXA Starvation, initial encounter: Secondary | ICD-10-CM | POA: Diagnosis not present

## 2024-01-18 DIAGNOSIS — E8729 Other acidosis: Secondary | ICD-10-CM | POA: Diagnosis not present

## 2024-01-18 LAB — COMPREHENSIVE METABOLIC PANEL
ALT: 45 U/L — ABNORMAL HIGH (ref 0–44)
AST: 44 U/L — ABNORMAL HIGH (ref 15–41)
Albumin: 3.7 g/dL (ref 3.5–5.0)
Alkaline Phosphatase: 41 U/L (ref 38–126)
Anion gap: 14 (ref 5–15)
BUN: <5 mg/dL — ABNORMAL LOW (ref 6–20)
CO2: 25 mmol/L (ref 22–32)
Calcium: 8.4 mg/dL — ABNORMAL LOW (ref 8.9–10.3)
Chloride: 94 mmol/L — ABNORMAL LOW (ref 98–111)
Creatinine, Ser: 0.87 mg/dL (ref 0.44–1.00)
GFR, Estimated: >60 mL/min (ref >60)
Glucose, Bld: 133 mg/dL — ABNORMAL HIGH (ref 70–99)
Potassium: 3.7 mmol/L (ref 3.5–5.1)
Sodium: 133 mmol/L — ABNORMAL LOW (ref 135–145)
Total Bilirubin: 2 mg/dL — ABNORMAL HIGH (ref 0.0–1.2)
Total Protein: 6.6 g/dL (ref 6.5–8.1)

## 2024-01-18 LAB — PROTIME-INR
INR: 1.1 (ref 0.8–1.2)
Prothrombin Time: 13.9 s (ref 11.4–15.2)

## 2024-01-18 LAB — LACTIC ACID, PLASMA
Lactic Acid, Venous: 2.7 mmol/L (ref 0.5–1.9)
Lactic Acid, Venous: 1.5 mmol/L (ref 0.5–1.9)

## 2024-01-18 LAB — CBC
HCT: 35.2 % — ABNORMAL LOW (ref 36.0–46.0)
Hemoglobin: 11.9 g/dL — ABNORMAL LOW (ref 12.0–15.0)
MCH: 32.8 pg (ref 26.0–34.0)
MCHC: 33.8 g/dL (ref 30.0–36.0)
MCV: 97 fL (ref 80.0–100.0)
Platelets: 196 10*3/uL (ref 150–400)
RBC: 3.63 MIL/uL — ABNORMAL LOW (ref 3.87–5.11)
RDW: 13.2 % (ref 11.5–15.5)
WBC: 8.6 10*3/uL (ref 4.0–10.5)
nRBC: 0 % (ref 0.0–0.2)

## 2024-01-18 MED ORDER — THIAMINE HCL 100 MG/ML IJ SOLN: 100.0000 mg | Freq: Every day | INTRAMUSCULAR | Status: DC

## 2024-01-18 MED ORDER — SODIUM CHLORIDE 0.9 % IV BOLUS
500.0000 mL | Freq: Once | INTRAVENOUS | Status: AC
  Administered 2024-01-18: 500 mL via INTRAVENOUS

## 2024-01-18 MED ORDER — QUETIAPINE FUMARATE 100 MG PO TABS
100.0000 mg | ORAL_TABLET | Freq: Every day | ORAL | 3 refills | Status: DC
  Filled 2024-01-18 – 2024-02-15 (×2): qty 30, 30d supply, fill #0
  Filled 2024-04-07: qty 30, 30d supply, fill #1

## 2024-01-18 MED ORDER — ONDANSETRON HCL 4 MG PO TABS
4.0000 mg | ORAL_TABLET | Freq: Four times a day (QID) | ORAL | 0 refills | Status: DC | PRN
  Filled 2024-01-18: qty 20, 5d supply, fill #0

## 2024-01-18 MED ORDER — ADULT MULTIVITAMIN W/MINERALS CH
1.0000 | ORAL_TABLET | Freq: Every day | ORAL | Status: DC
  Administered 2024-01-18: 1 via ORAL
  Filled 2024-01-18 (×2): qty 1

## 2024-01-18 MED ORDER — LORAZEPAM 2 MG/ML IJ SOLN: 1.0000 mg | INTRAMUSCULAR | Status: DC | PRN

## 2024-01-18 MED ORDER — FOLIC ACID 1 MG PO TABS: 1.0000 mg | ORAL_TABLET | Freq: Every day | ORAL | Status: DC

## 2024-01-18 MED ORDER — SUCRALFATE 1 G PO TABS
1.0000 g | ORAL_TABLET | Freq: Four times a day (QID) | ORAL | 0 refills | Status: DC
  Filled 2024-01-18: qty 56, 14d supply, fill #0

## 2024-01-18 MED ORDER — FAMOTIDINE 40 MG PO TABS
40.0000 mg | ORAL_TABLET | Freq: Every day | ORAL | 0 refills | Status: DC
  Filled 2024-01-18: qty 30, 30d supply, fill #0

## 2024-01-18 MED ORDER — PANTOPRAZOLE SODIUM 40 MG PO TBEC
40.0000 mg | DELAYED_RELEASE_TABLET | Freq: Two times a day (BID) | ORAL | Status: DC
  Administered 2024-01-18: 40 mg via ORAL
  Filled 2024-01-18: qty 1

## 2024-01-18 MED ORDER — HYDROXYZINE HCL 25 MG PO TABS
25.0000 mg | ORAL_TABLET | Freq: Three times a day (TID) | ORAL | 3 refills | Status: DC | PRN
  Filled 2024-01-18 – 2024-04-07 (×2): qty 90, 30d supply, fill #0

## 2024-01-18 MED ORDER — LORAZEPAM 1 MG PO TABS
1.0000 mg | ORAL_TABLET | ORAL | Status: DC | PRN
  Administered 2024-01-18: 1 mg via ORAL
  Filled 2024-01-18: qty 1

## 2024-01-18 MED ORDER — PANTOPRAZOLE SODIUM 40 MG PO TBEC
40.0000 mg | DELAYED_RELEASE_TABLET | Freq: Two times a day (BID) | ORAL | 3 refills | Status: DC
  Filled 2024-01-18 – 2024-04-07 (×2): qty 180, 90d supply, fill #0

## 2024-01-18 MED ORDER — THIAMINE MONONITRATE 100 MG PO TABS: 100.0000 mg | ORAL_TABLET | Freq: Every day | ORAL | Status: DC

## 2024-01-18 NOTE — Discharge Summary (Addendum)
 Physician Discharge Summary  Deborah Juarez ZOX:[id number] DOB: [birth year] DOA: 01/17/2024  PCP: Mila Palmer, MD  Admit date: 01/17/2024 Discharge date: 01/18/2024  Time spent: 36 minutes  Recommendations for Outpatient Follow-up:  Recommend Chem-7 CBC 1 week Refilled Protonix Pepcid given a prescription short-term Carafate--refill Seroquel and hydroxyzine Please refer for counseling/psychiatric evaluation to assist She may need referral to GI if she continues to have abdominal issues she has a history of cholelithiasis without cholecystitis previously  Discharge Diagnoses:  MAIN problem for hospitalization   Starvation ketosis  Please see below for itemized issues addressed in HOpsital- refer to other progress notes for clarity if needed  Discharge Condition: Improved  Diet recommendation: Regular  Filed Weights   01/17/24 1141  Weight: 61.2 kg    History of present illness:  33 year old female--currently follows with has 105-month-old 28-year-old at home with her currently living separated from husband Known history of anxiety prior severe Postpartum depression previously used Seroquel Underlying asthma Reflux with positive THC and 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  10-12 times clear emesis at the end nothing really coming up-no associated fever chills rigor sick ill contacts  On arrival she had no further vomiting but was clearly anxious her CO2 is 9 with severe acidosis BUN/creatinine 5/0.9 lactic acid was quite elevated at 8.7 WBC  was 11  Her ABG showed a mild acidosis of 7.29 pCO2 of 18 O2 68 but she was not requiring oxygen  Her lactic acidosis was treated with boluses of IV fluid and she was kept on sodium bicarb and this completely resolved at time of discharge with a CO2 of 25 and she was keeping down some food It was felt that she had a starvation ketosis no acute infectious process was felt to be contributory and she was trialed on a diet and was able to eat some breakfast on day of discharge and we will see how she does at lunch but I think if she continues to do well we will discharge her home on Protonix 40 twice daily, refill her Pepcid I will give her Carafate for 2 weeks to help with gastritis probably from her alcohol use Her CO slightly elevated overnight in the 9 range but came down to 4 and I think there is a component of underlying anxiety additionally She was not grossly tremulous although she had elevated heart rate  For her depression we have prescribed her usual meds that she ran out of her sertraline and hydroxyzine which she has used in the past with good effect and she will need to be seen by psychiatrist  It was felt that she had maximally benefited if she eats enough for lunch from this hospital stay and that she could go home Nursing will let me know if otherwise She knows to follow-up with her primary physician and we will call in the meds to meds to bed program    Discharge Exam: Vitals:   01/18/24 0306 01/18/24 0455  BP: (!) 147/101 (!) 138/95  Pulse: 99 97  Resp:  20  Temp:  98.9 F (37.2  C)  SpO2:  100%    Subj on day of d/c   Awake coherent seems more comfortable sitting up Not tremulous Did eat 1 egg and some breakfast this morning with no vomiting Tolerated some chicken soup yesterday with no nausea vomiting   Discharge Instructions    Allergies as of 01/18/2024       Reactions   Protonix [pantoprazole] Other (See Comments)   GERD        Medication List      STOP taking these medications    Advil 200 MG tablet Generic drug: ibuprofen       TAKE these medications    famotidine 40 MG tablet Commonly known as: PEPCID Take 1 tablet (40 mg total) by mouth daily. What changed:  when to take this reasons to take this   hydrOXYzine 25 MG tablet Commonly known as: ATARAX Take 1 tablet (25 mg total) by mouth 3 (three) times daily as needed.   medroxyPROGESTERone Acetate 150 MG/ML Susy Inject 1 mL (150 mg total) into the muscle every 3 (three) months.   ondansetron 4 MG tablet Commonly known as: ZOFRAN Take 1 tablet (4 mg total) by mouth every 6 (six) hours as needed for nausea.   pantoprazole 40 MG tablet Commonly known as: Protonix Take 1 tablet (40 mg total) by mouth 2 (two) times daily.   promethazine 25 MG tablet Commonly known as: PHENERGAN Take 1 tablet (25 mg total) by mouth every 8 (eight) hours as needed for nausea or vomiting.   QUEtiapine 100 MG tablet Commonly known as: SEROquel Take 1 tablet (100 mg total) by mouth at bedtime.   sucralfate 1 g tablet Commonly known as: Carafate Take 1 tablet (1 g total) by mouth 4 (four) times daily for 14 days.       Allergies  Allergen Reactions   Protonix [Pantoprazole] Other (See Comments)    GERD      The results of significant diagnostics from this hospitalization (including imaging, microbiology, ancillary and laboratory) are listed below for reference.    Significant Diagnostic Studies: DG Chest 2 View Result Date: 01/17/2024 CLINICAL DATA:  Chest pain.  Syncopal episode. EXAM: CHEST - 2 VIEW COMPARISON:  Chest radiograph dated Apr 12, 2021. FINDINGS: The heart size and mediastinal contours are within normal limits. Both lungs are clear. No pleural effusion or pneumothorax. No acute osseous abnormality. IMPRESSION: No acute cardiopulmonary findings. Electronically Signed   By: Hart Robinsons M.D.   On: 01/17/2024 15:27    Microbiology: No results found for  this or any previous visit (from the past 240 hours).   Labs: Basic Metabolic Panel: Recent Labs  Lab 01/17/24 1202 01/17/24 1350 01/17/24 1402 01/18/24 0053  NA 138  --  137 133*  K 4.3  --  4.0 3.7  CL 101  --   --  94*  CO2 9*  --   --  25  GLUCOSE 71  --   --  133*  BUN <5*  --   --  <5*  CREATININE 0.91  --   --  0.87  CALCIUM 9.1  --   --  8.4*  MG  --  1.8  --   --    Liver Function Tests: Recent Labs  Lab 01/17/24 1202 01/18/24 0053  AST 76* 44*  ALT 63* 45*  ALKPHOS 46 41  BILITOT 1.2 2.0*  PROT 8.3* 6.6  ALBUMIN 4.7 3.7   Recent Labs  Lab 01/17/24 1202  LIPASE  40   No results for input(s): "AMMONIA" in the last 168 hours. CBC: Recent Labs  Lab 01/17/24 1350 01/17/24 1402 01/18/24 0053  WBC 11.1*  --  8.6  NEUTROABS 8.5*  --   --   HGB 11.4* 13.3 11.9*  HCT 35.8* 39.0 35.2*  MCV 105.3*  --  97.0  PLT 209  --  196   Cardiac Enzymes: No results for input(s): "CKTOTAL", "CKMB", "CKMBINDEX", "TROPONINI" in the last 168 hours. BNP: BNP (last 3 results) No results for input(s): "BNP" in the last 8760 hours.  ProBNP (last 3 results) No results for input(s): "PROBNP" in the last 8760 hours.  CBG: Recent Labs  Lab 01/17/24 1147  GLUCAP 76    Signed:  Rhetta Mura MD   [facility] 01/18/2024, 8:49 AM

## 2024-01-18 NOTE — Telephone Encounter (Signed)
 Pharmacy Patient Advocate Encounter  Received notification from [facility] that Prior Authorization for QUEtiapine Fumarate 100MG  tablets has been APPROVED from 01/18/2024 to 01/17/2025   PA #/Case ID/Reference #: [id number] Key: [id number]

## 2024-02-12 ENCOUNTER — Other Ambulatory Visit: Payer: Self-pay

## 2024-02-15 ENCOUNTER — Other Ambulatory Visit (HOSPITAL_COMMUNITY): Payer: Self-pay

## 2024-04-07 ENCOUNTER — Other Ambulatory Visit (HOSPITAL_COMMUNITY): Payer: Self-pay

## 2024-04-15 ENCOUNTER — Other Ambulatory Visit (HOSPITAL_COMMUNITY): Payer: Self-pay

## 2024-04-29 ENCOUNTER — Other Ambulatory Visit: Payer: Self-pay

## 2024-05-16 ENCOUNTER — Inpatient Hospital Stay (HOSPITAL_COMMUNITY)
Admission: EM | Admit: 2024-05-16 | Discharge: 2024-05-19 | DRG: 894 | Discharge status: Against Medical Advice | Attending: Internal Medicine | Admitting: Internal Medicine

## 2024-05-16 ENCOUNTER — Emergency Department (HOSPITAL_COMMUNITY)

## 2024-05-16 ENCOUNTER — Inpatient Hospital Stay (HOSPITAL_COMMUNITY)

## 2024-05-16 ENCOUNTER — Encounter (HOSPITAL_COMMUNITY): Payer: Self-pay

## 2024-05-16 ENCOUNTER — Other Ambulatory Visit: Payer: Self-pay

## 2024-05-16 DIAGNOSIS — Z823 Family history of stroke: Secondary | ICD-10-CM | POA: Diagnosis not present

## 2024-05-16 DIAGNOSIS — R112 Nausea with vomiting, unspecified: Secondary | ICD-10-CM | POA: Diagnosis not present

## 2024-05-16 DIAGNOSIS — E878 Other disorders of electrolyte and fluid balance, not elsewhere classified: Secondary | ICD-10-CM | POA: Diagnosis present

## 2024-05-16 DIAGNOSIS — R9431 Abnormal electrocardiogram [ECG] [EKG]: Secondary | ICD-10-CM | POA: Diagnosis present

## 2024-05-16 DIAGNOSIS — J45909 Unspecified asthma, uncomplicated: Secondary | ICD-10-CM | POA: Diagnosis present

## 2024-05-16 DIAGNOSIS — N179 Acute kidney failure, unspecified: Secondary | ICD-10-CM | POA: Diagnosis not present

## 2024-05-16 DIAGNOSIS — K859 Acute pancreatitis without necrosis or infection, unspecified: Secondary | ICD-10-CM | POA: Diagnosis not present

## 2024-05-16 DIAGNOSIS — Z8249 Family history of ischemic heart disease and other diseases of the circulatory system: Secondary | ICD-10-CM | POA: Diagnosis not present

## 2024-05-16 DIAGNOSIS — K828 Other specified diseases of gallbladder: Secondary | ICD-10-CM | POA: Diagnosis present

## 2024-05-16 DIAGNOSIS — E871 Hypo-osmolality and hyponatremia: Secondary | ICD-10-CM | POA: Diagnosis present

## 2024-05-16 DIAGNOSIS — E872 Acidosis, unspecified: Secondary | ICD-10-CM | POA: Diagnosis present

## 2024-05-16 DIAGNOSIS — K8020 Calculus of gallbladder without cholecystitis without obstruction: Secondary | ICD-10-CM | POA: Diagnosis present

## 2024-05-16 DIAGNOSIS — E860 Dehydration: Secondary | ICD-10-CM | POA: Diagnosis present

## 2024-05-16 DIAGNOSIS — E8721 Acute metabolic acidosis: Secondary | ICD-10-CM | POA: Diagnosis present

## 2024-05-16 DIAGNOSIS — F10930 Alcohol use, unspecified with withdrawal, uncomplicated: Secondary | ICD-10-CM

## 2024-05-16 DIAGNOSIS — D696 Thrombocytopenia, unspecified: Secondary | ICD-10-CM | POA: Diagnosis not present

## 2024-05-16 DIAGNOSIS — F10231 Alcohol dependence with withdrawal delirium: Secondary | ICD-10-CM | POA: Diagnosis present

## 2024-05-16 DIAGNOSIS — K7010 Alcoholic hepatitis without ascites: Secondary | ICD-10-CM | POA: Diagnosis present

## 2024-05-16 DIAGNOSIS — Z808 Family history of malignant neoplasm of other organs or systems: Secondary | ICD-10-CM

## 2024-05-16 DIAGNOSIS — K2100 Gastro-esophageal reflux disease with esophagitis, without bleeding: Secondary | ICD-10-CM | POA: Diagnosis present

## 2024-05-16 DIAGNOSIS — K2980 Duodenitis without bleeding: Secondary | ICD-10-CM | POA: Diagnosis present

## 2024-05-16 DIAGNOSIS — K760 Fatty (change of) liver, not elsewhere classified: Secondary | ICD-10-CM | POA: Diagnosis present

## 2024-05-16 DIAGNOSIS — E876 Hypokalemia: Secondary | ICD-10-CM | POA: Diagnosis present

## 2024-05-16 DIAGNOSIS — E8729 Other acidosis: Secondary | ICD-10-CM | POA: Diagnosis present

## 2024-05-16 DIAGNOSIS — F10221 Alcohol dependence with intoxication delirium: Secondary | ICD-10-CM | POA: Diagnosis present

## 2024-05-16 DIAGNOSIS — F10929 Alcohol use, unspecified with intoxication, unspecified: Secondary | ICD-10-CM | POA: Diagnosis not present

## 2024-05-16 DIAGNOSIS — D6959 Other secondary thrombocytopenia: Secondary | ICD-10-CM | POA: Diagnosis present

## 2024-05-16 DIAGNOSIS — F411 Generalized anxiety disorder: Secondary | ICD-10-CM | POA: Diagnosis present

## 2024-05-16 DIAGNOSIS — F1290 Cannabis use, unspecified, uncomplicated: Secondary | ICD-10-CM | POA: Diagnosis present

## 2024-05-16 DIAGNOSIS — K852 Alcohol induced acute pancreatitis without necrosis or infection: Secondary | ICD-10-CM | POA: Diagnosis present

## 2024-05-16 DIAGNOSIS — R000 Tachycardia, unspecified: Secondary | ICD-10-CM | POA: Diagnosis present

## 2024-05-16 DIAGNOSIS — R109 Unspecified abdominal pain: Secondary | ICD-10-CM | POA: Diagnosis present

## 2024-05-16 DIAGNOSIS — Z79899 Other long term (current) drug therapy: Secondary | ICD-10-CM | POA: Diagnosis not present

## 2024-05-16 DIAGNOSIS — F10939 Alcohol use, unspecified with withdrawal, unspecified: Principal | ICD-10-CM

## 2024-05-16 DIAGNOSIS — Z5329 Procedure and treatment not carried out because of patient's decision for other reasons: Secondary | ICD-10-CM | POA: Diagnosis present

## 2024-05-16 DIAGNOSIS — Y901 Blood alcohol level of 20-39 mg/100 ml: Secondary | ICD-10-CM | POA: Diagnosis present

## 2024-05-16 LAB — COMPREHENSIVE METABOLIC PANEL WITH GFR
ALT: 66 U/L — ABNORMAL HIGH (ref 0–44)
AST: 161 U/L — ABNORMAL HIGH (ref 15–41)
Albumin: 4.6 g/dL (ref 3.5–5.0)
Alkaline Phosphatase: 62 U/L (ref 38–126)
Anion gap: 36 — ABNORMAL HIGH (ref 5–15)
BUN: 5 mg/dL — ABNORMAL LOW (ref 6–20)
CO2: 9 mmol/L — ABNORMAL LOW (ref 22–32)
Calcium: 9.5 mg/dL (ref 8.9–10.3)
Chloride: 95 mmol/L — ABNORMAL LOW (ref 98–111)
Creatinine, Ser: 1.06 mg/dL — ABNORMAL HIGH (ref 0.44–1.00)
GFR, Estimated: >60 mL/min (ref >60)
Glucose, Bld: 117 mg/dL — ABNORMAL HIGH (ref 70–99)
Potassium: 3.6 mmol/L (ref 3.5–5.1)
Sodium: 140 mmol/L (ref 135–145)
Total Bilirubin: 1.5 mg/dL — ABNORMAL HIGH (ref 0.0–1.2)
Total Protein: 8.2 g/dL — ABNORMAL HIGH (ref 6.5–8.1)

## 2024-05-16 LAB — URINALYSIS, ROUTINE W REFLEX MICROSCOPIC
Bilirubin Urine: NEGATIVE
Glucose, UA: NEGATIVE mg/dL
Ketones, ur: 80 mg/dL — AB
Leukocytes,Ua: NEGATIVE
Nitrite: NEGATIVE
Protein, ur: 100 mg/dL — AB
Specific Gravity, Urine: 1.017 (ref 1.005–1.030)
pH: 6 (ref 5.0–8.0)

## 2024-05-16 LAB — CBC WITH DIFFERENTIAL/PLATELET
Abs Immature Granulocytes: 0.04 10*3/uL (ref 0.00–0.07)
Basophils Absolute: 0.1 10*3/uL (ref 0.0–0.1)
Basophils Relative: 1 %
Eosinophils Absolute: 0 10*3/uL (ref 0.0–0.5)
Eosinophils Relative: 0 %
HCT: 37.5 % (ref 36.0–46.0)
Hemoglobin: 11.7 g/dL — ABNORMAL LOW (ref 12.0–15.0)
Immature Granulocytes: 1 %
Lymphocytes Relative: 15 %
Lymphs Abs: 1 10*3/uL (ref 0.7–4.0)
MCH: 31 pg (ref 26.0–34.0)
MCHC: 31.2 g/dL (ref 30.0–36.0)
MCV: 99.5 fL (ref 80.0–100.0)
Monocytes Absolute: 0.4 10*3/uL (ref 0.1–1.0)
Monocytes Relative: 5 %
Neutro Abs: 5.3 10*3/uL (ref 1.7–7.7)
Neutrophils Relative %: 78 %
Platelets: 112 10*3/uL — ABNORMAL LOW (ref 150–400)
RBC: 3.77 MIL/uL — ABNORMAL LOW (ref 3.87–5.11)
RDW: 14.2 % (ref 11.5–15.5)
WBC: 6.8 10*3/uL (ref 4.0–10.5)
nRBC: 0 % (ref 0.0–0.2)

## 2024-05-16 LAB — I-STAT VENOUS BLOOD GAS, ED
Acid-base deficit: 14 mmol/L — ABNORMAL HIGH (ref 0.0–2.0)
Bicarbonate: 11 mmol/L — ABNORMAL LOW (ref 20.0–28.0)
Calcium, Ion: 1.04 mmol/L — ABNORMAL LOW (ref 1.15–1.40)
HCT: 40 % (ref 36.0–46.0)
Hemoglobin: 13.6 g/dL (ref 12.0–15.0)
O2 Saturation: 100 %
Potassium: 4 mmol/L (ref 3.5–5.1)
Sodium: 134 mmol/L — ABNORMAL LOW (ref 135–145)
TCO2: 12 mmol/L — ABNORMAL LOW (ref 22–32)
pCO2, Ven: 24.6 mmHg — ABNORMAL LOW (ref 44–60)
pH, Ven: 7.259 (ref 7.25–7.43)
pO2, Ven: 187 mmHg — ABNORMAL HIGH (ref 32–45)

## 2024-05-16 LAB — RAPID URINE DRUG SCREEN, HOSP PERFORMED
Amphetamines: NOT DETECTED
Barbiturates: NOT DETECTED
Benzodiazepines: NOT DETECTED
Cocaine: NOT DETECTED
Opiates: NOT DETECTED
Tetrahydrocannabinol: POSITIVE — AB

## 2024-05-16 LAB — I-STAT CG4 LACTIC ACID, ED
Lactic Acid, Venous: 7.5 mmol/L (ref 0.5–1.9)
Lactic Acid, Venous: 5.1 mmol/L (ref 0.5–1.9)

## 2024-05-16 LAB — BETA-HYDROXYBUTYRIC ACID: Beta-Hydroxybutyric Acid: >8 mmol/L — ABNORMAL HIGH (ref 0.05–0.27)

## 2024-05-16 LAB — HCG, SERUM, QUALITATIVE: Preg, Serum: NEGATIVE

## 2024-05-16 LAB — LIPASE, BLOOD: Lipase: 264 U/L — ABNORMAL HIGH (ref 11–51)

## 2024-05-16 LAB — ETHANOL: Alcohol, Ethyl (B): 38 mg/dL — ABNORMAL HIGH (ref <15)

## 2024-05-16 MED ORDER — ENOXAPARIN SODIUM 40 MG/0.4ML IJ SOSY
40.0000 mg | PREFILLED_SYRINGE | INTRAMUSCULAR | Status: DC
  Administered 2024-05-18: 40 mg via SUBCUTANEOUS
  Filled 2024-05-16 (×2): qty 0.4

## 2024-05-16 MED ORDER — ADULT MULTIVITAMIN W/MINERALS CH: 1.0000 | ORAL_TABLET | Freq: Every day | ORAL | Status: DC

## 2024-05-16 MED ORDER — HYDROXYZINE HCL 25 MG PO TABS
25.0000 mg | ORAL_TABLET | Freq: Three times a day (TID) | ORAL | Status: DC | PRN
  Administered 2024-05-17 – 2024-05-18 (×2): 25 mg via ORAL
  Filled 2024-05-16 (×3): qty 1

## 2024-05-16 MED ORDER — SODIUM CHLORIDE 0.9% FLUSH
3.0000 mL | INTRAVENOUS | Status: DC | PRN
Start: 2024-05-16 — End: 2024-05-18

## 2024-05-16 MED ORDER — LACTATED RINGERS IV BOLUS
1000.0000 mL | Freq: Once | INTRAVENOUS | Status: AC
  Administered 2024-05-16: 1000 mL via INTRAVENOUS

## 2024-05-16 MED ORDER — LORAZEPAM 1 MG PO TABS
1.0000 mg | ORAL_TABLET | ORAL | Status: DC | PRN
  Administered 2024-05-19: 1 mg via ORAL
  Filled 2024-05-16: qty 1

## 2024-05-16 MED ORDER — LORAZEPAM 2 MG/ML IJ SOLN: 1.0000 mg | INTRAMUSCULAR | Status: DC | PRN

## 2024-05-16 MED ORDER — SODIUM CHLORIDE 0.9 % IV SOLN: 250.0000 mL | INTRAVENOUS | Status: AC | PRN

## 2024-05-16 MED ORDER — THIAMINE HCL 100 MG/ML IJ SOLN
100.0000 mg | Freq: Every day | INTRAMUSCULAR | Status: DC
  Filled 2024-05-16 (×2): qty 2

## 2024-05-16 MED ORDER — PANTOPRAZOLE SODIUM 40 MG PO TBEC: 40.0000 mg | DELAYED_RELEASE_TABLET | Freq: Every day | ORAL | Status: DC

## 2024-05-16 MED ORDER — QUETIAPINE FUMARATE 100 MG PO TABS: 100.0000 mg | ORAL_TABLET | Freq: Every day | ORAL | Status: DC

## 2024-05-16 MED ORDER — THIAMINE HCL 100 MG/ML IJ SOLN
100.0000 mg | INTRAMUSCULAR | Status: AC
  Administered 2024-05-16: 100 mg via INTRAVENOUS
  Filled 2024-05-16: qty 2

## 2024-05-16 MED ORDER — THIAMINE MONONITRATE 100 MG PO TABS: 500.0000 mg | ORAL_TABLET | ORAL | Status: DC

## 2024-05-16 MED ORDER — FAMOTIDINE IN NACL 20-0.9 MG/50ML-% IV SOLN
20.0000 mg | Freq: Every day | INTRAVENOUS | Status: DC
  Administered 2024-05-17 – 2024-05-18 (×2): 20 mg via INTRAVENOUS
  Filled 2024-05-16 (×2): qty 50

## 2024-05-16 MED ORDER — FAMOTIDINE IN NACL 20-0.9 MG/50ML-% IV SOLN
20.0000 mg | Freq: Once | INTRAVENOUS | Status: AC
  Administered 2024-05-16: 20 mg via INTRAVENOUS
  Filled 2024-05-16: qty 50

## 2024-05-16 MED ORDER — SODIUM CHLORIDE 0.9% FLUSH
3.0000 mL | Freq: Two times a day (BID) | INTRAVENOUS | Status: DC
Start: 2024-05-16 — End: 2024-05-18
  Administered 2024-05-16 – 2024-05-17 (×2): 3 mL via INTRAVENOUS

## 2024-05-16 MED ORDER — SODIUM CHLORIDE 0.9% FLUSH: 3.0000 mL | INTRAVENOUS | Status: DC | PRN

## 2024-05-16 MED ORDER — ONDANSETRON HCL 4 MG/2ML IJ SOLN
4.0000 mg | Freq: Once | INTRAMUSCULAR | Status: AC
  Administered 2024-05-16: 4 mg via INTRAVENOUS
  Filled 2024-05-16: qty 2

## 2024-05-16 MED ORDER — BISACODYL 5 MG PO TBEC: 5.0000 mg | DELAYED_RELEASE_TABLET | Freq: Every day | ORAL | Status: DC | PRN

## 2024-05-16 MED ORDER — TRIMETHOBENZAMIDE HCL 100 MG/ML IM SOLN: 200.0000 mg | Freq: Three times a day (TID) | INTRAMUSCULAR | Status: DC | PRN

## 2024-05-16 MED ORDER — HYDROXYZINE HCL 25 MG PO TABS
25.0000 mg | ORAL_TABLET | Freq: Once | ORAL | Status: AC
  Administered 2024-05-16: 25 mg via ORAL
  Filled 2024-05-16: qty 1

## 2024-05-16 MED ORDER — THIAMINE MONONITRATE 100 MG PO TABS
100.0000 mg | ORAL_TABLET | Freq: Every day | ORAL | Status: DC
  Administered 2024-05-16 – 2024-05-18 (×3): 100 mg via ORAL
  Filled 2024-05-16 (×3): qty 1

## 2024-05-16 MED ORDER — SODIUM CHLORIDE 0.9% FLUSH
3.0000 mL | Freq: Two times a day (BID) | INTRAVENOUS | Status: DC
  Administered 2024-05-17: 3 mL via INTRAVENOUS

## 2024-05-16 MED ORDER — DEXTROSE IN LACTATED RINGERS 5 % IV SOLN: INTRAVENOUS | Status: DC

## 2024-05-16 MED ORDER — ADULT MULTIVITAMIN W/MINERALS CH
1.0000 | ORAL_TABLET | Freq: Every day | ORAL | Status: DC
  Administered 2024-05-16 – 2024-05-18 (×3): 1 via ORAL
  Filled 2024-05-16 (×3): qty 1

## 2024-05-16 MED ORDER — LORAZEPAM 2 MG/ML IJ SOLN
1.0000 mg | INTRAMUSCULAR | Status: DC | PRN
  Administered 2024-05-16: 1 mg via INTRAVENOUS
  Administered 2024-05-16 – 2024-05-17 (×3): 2 mg via INTRAVENOUS
  Administered 2024-05-17: 3 mg via INTRAVENOUS
  Filled 2024-05-16: qty 1
  Filled 2024-05-16: qty 2
  Filled 2024-05-16 (×3): qty 1

## 2024-05-16 MED ORDER — FENTANYL CITRATE PF 50 MCG/ML IJ SOSY: 12.5000 ug | PREFILLED_SYRINGE | INTRAMUSCULAR | Status: DC | PRN

## 2024-05-16 MED ORDER — FOLIC ACID 1 MG PO TABS
1.0000 mg | ORAL_TABLET | Freq: Every day | ORAL | Status: DC
  Administered 2024-05-16 – 2024-05-18 (×3): 1 mg via ORAL
  Filled 2024-05-16 (×3): qty 1

## 2024-05-16 MED ORDER — FOLIC ACID 1 MG PO TABS: 1.0000 mg | ORAL_TABLET | Freq: Every day | ORAL | Status: DC

## 2024-05-16 MED ORDER — IBUPROFEN 400 MG PO TABS
400.0000 mg | ORAL_TABLET | Freq: Four times a day (QID) | ORAL | Status: DC | PRN
Start: 2024-05-16 — End: 2024-05-18

## 2024-05-16 MED ORDER — ONDANSETRON 4 MG PO TBDP
4.0000 mg | ORAL_TABLET | Freq: Once | ORAL | Status: AC
Start: 2024-05-16 — End: 2024-05-16
  Administered 2024-05-16: 4 mg via ORAL
  Filled 2024-05-16: qty 1

## 2024-05-16 MED ORDER — MORPHINE SULFATE (PF) 4 MG/ML IV SOLN: 4.0000 mg | Freq: Once | INTRAVENOUS | Status: DC

## 2024-05-16 MED ORDER — ALUM & MAG HYDROXIDE-SIMETH 200-200-20 MG/5ML PO SUSP
30.0000 mL | Freq: Once | ORAL | Status: AC
  Administered 2024-05-16: 30 mL via ORAL
  Filled 2024-05-16: qty 30

## 2024-05-16 MED ORDER — LIDOCAINE VISCOUS HCL 2 % MT SOLN
15.0000 mL | Freq: Once | OROMUCOSAL | Status: AC
  Administered 2024-05-16: 15 mL via ORAL
  Filled 2024-05-16: qty 15

## 2024-05-16 MED ORDER — FAMOTIDINE 20 MG PO TABS: 40.0000 mg | ORAL_TABLET | Freq: Every day | ORAL | Status: DC

## 2024-05-16 MED ORDER — LORAZEPAM 1 MG PO TABS: 1.0000 mg | ORAL_TABLET | ORAL | Status: DC | PRN

## 2024-05-16 NOTE — ED Provider Notes (Signed)
 [facility] Provider Note  CSN: [id number] Arrival date & time: 05/16/24 1252  Chief Complaint(s) Abdominal Pain  HPI Deborah Juarez is a 33 y.o. female with PMH alcohol abuse with recent hospital admission for starvation/alcoholic ketoacidosis who presents emerged part for evaluation of abdominal pain nausea and vomiting.  Patient states that she drinks vodka daily likely 1 pint daily with last drink 2 days ago.  Arrives with tremors, anxiety, epigastric and lower abdominal pain.  She states that she would like help for her alcohol use as she is very stressed at home.  Denies chest pain, shortness of breath, headache, fever or other systemic symptoms.   Past Medical History Past Medical History:  Diagnosis Date   Anxiety    Anxiety    Asthma    long  time since used rescue inhaler   GERD (gastroesophageal reflux disease)    Post partum depression    Patient Active Problem List   Diagnosis Date Noted   Starvation ketoacidosis 01/17/2024   Intractable nausea and vomiting 04/19/2023   Acute gastroenteritis 12/26/2022   Abnormal ECG 12/26/2022   Palpitations 12/26/2022   Prolonged QT interval 12/26/2022   Metabolic acidosis 12/26/2022   Transaminitis 12/26/2022   Depressive disorder 12/18/2022   Normal labor 09/06/2022   Indication for care in labor and delivery, antepartum 09/05/2022   Asthma 03/05/2015   Anxiety 03/05/2015   Status post primary low transverse cesarean section--malpresentation 03/03/2015   Home Medication(s) Prior to Admission medications   Medication Sig Start Date End Date Taking? Authorizing Provider  famotidine  (PEPCID ) 40 MG tablet Take 1 tablet (40 mg total) by mouth daily. 01/18/24 02/17/24  Samtani, Jai-Gurmukh, MD  hydrOXYzine  (ATARAX ) 25 MG tablet Take 1 tablet (25 mg total) by mouth 3 (three) times daily as needed. 01/18/24   Samtani, Jai-Gurmukh, MD  medroxyPROGESTERone  Acetate 150 MG/ML SUSY  Inject 1 mL (150 mg total) into the muscle every 3 (three) months. 08/27/23     ondansetron  (ZOFRAN ) 4 MG tablet Take 1 tablet (4 mg total) by mouth every 6 (six) hours as needed for nausea. 01/18/24   Samtani, Jai-Gurmukh, MD  pantoprazole  (PROTONIX ) 40 MG tablet Take 1 tablet (40 mg total) by mouth 2 (two) times daily. 01/18/24 01/17/25  Samtani, Jai-Gurmukh, MD  promethazine  (PHENERGAN ) 25 MG tablet Take 1 tablet (25 mg total) by mouth every 8 (eight) hours as needed for nausea or vomiting. 11/03/23   Rancour, Garnette, MD  QUEtiapine  (SEROQUEL ) 100 MG tablet Take 1 tablet (100 mg total) by mouth at bedtime. 01/18/24   Samtani, Jai-Gurmukh, MD  sucralfate  (CARAFATE ) 1 g tablet Take 1 tablet (1 g total) by mouth 4 (four) times daily for 14 days. 01/18/24 02/01/24  Samtani, Jai-Gurmukh, MD  Past Surgical History Past Surgical History:  Procedure Laterality Date   BREAST SURGERY     CESAREAN SECTION N/A 03/03/2015   Procedure: CESAREAN SECTION;  Surgeon: Delon Prude, DO;  Location: [facility];  Service: Obstetrics;  Laterality: N/A;   Family History Family History  Problem Relation Age of Onset   Cancer Father        Pacreatic Ca   Heart disease Father    Heart disease Maternal Grandfather    Heart disease Paternal Grandfather    Stroke Paternal Grandfather     Social History Social History   Tobacco Use   Smoking status: Never   Smokeless tobacco: Never  Vaping Use   Vaping status: Never Used  Substance Use Topics   Alcohol use: Not Currently    Alcohol/week: 3.0 standard drinks of alcohol    Types: 3 Cans of beer per week   Drug use: No   Allergies Protonix  [pantoprazole ]  Review of Systems Review of Systems  Gastrointestinal:  Positive for abdominal pain, nausea and vomiting.  Neurological:  Positive for tremors.  Psychiatric/Behavioral:  The patient  is nervous/anxious.     Physical Exam Vital Signs  I have reviewed the triage vital signs BP (!) 140/99 (BP Location: Left Arm)   Pulse (!) 116   Temp 97.6 F (36.4 C)   Resp (!) 21   Ht 5' 7 (1.702 m)   Wt 63.5 kg   SpO2 100%   Breastfeeding No   BMI 21.93 kg/m   Physical Exam Vitals and nursing note reviewed.  Constitutional:      General: She is not in acute distress.    Appearance: She is well-developed.  HENT:     Head: Normocephalic and atraumatic.   Eyes:     Conjunctiva/sclera: Conjunctivae normal.    Cardiovascular:     Rate and Rhythm: Normal rate and regular rhythm.     Heart sounds: No murmur heard. Pulmonary:     Effort: Pulmonary effort is normal. No respiratory distress.     Breath sounds: Normal breath sounds.  Abdominal:     Palpations: Abdomen is soft.     Tenderness: There is abdominal tenderness in the epigastric area.   Musculoskeletal:        General: No swelling.     Cervical back: Neck supple.   Skin:    General: Skin is warm and dry.     Capillary Refill: Capillary refill takes less than 2 seconds.   Neurological:     Mental Status: She is alert.   Psychiatric:        Mood and Affect: Mood normal.     ED Results and Treatments Labs (all labs ordered are listed, but only abnormal results are displayed) Labs Reviewed  CBC WITH DIFFERENTIAL/PLATELET - Abnormal; Notable for the following components:      Result Value   RBC 3.77 (*)    Hemoglobin 11.7 (*)    Platelets 112 (*)    All other components within normal limits  COMPREHENSIVE METABOLIC PANEL WITH GFR - Abnormal; Notable for the following components:   Chloride 95 (*)    CO2 9 (*)    Glucose, Bld 117 (*)    BUN 5 (*)    Creatinine, Ser 1.06 (*)    Total Protein 8.2 (*)    AST 161 (*)    ALT 66 (*)    Total Bilirubin 1.5 (*)    Anion gap 36 (*)    All other components  within normal limits  LIPASE, BLOOD - Abnormal; Notable for the following components:   Lipase  264 (*)    All other components within normal limits  URINALYSIS, ROUTINE W REFLEX MICROSCOPIC - Abnormal; Notable for the following components:   APPearance HAZY (*)    Hgb urine dipstick SMALL (*)    Ketones, ur 80 (*)    Protein, ur 100 (*)    Bacteria, UA RARE (*)    All other components within normal limits  ETHANOL - Abnormal; Notable for the following components:   Alcohol, Ethyl (B) 38 (*)    All other components within normal limits  RAPID URINE DRUG SCREEN, HOSP PERFORMED - Abnormal; Notable for the following components:   Tetrahydrocannabinol POSITIVE (*)    All other components within normal limits  HCG, SERUM, QUALITATIVE  BLOOD GAS, VENOUS  BETA-HYDROXYBUTYRIC ACID  I-STAT CG4 LACTIC ACID, ED                                                                                                                          Radiology US  Abdomen Limited Result Date: 05/16/2024 CLINICAL DATA:  Abdominal pain. EXAM: ULTRASOUND ABDOMEN LIMITED RIGHT UPPER QUADRANT COMPARISON:  04/20/2023. FINDINGS: Gallbladder: No gallbladder wall thickening, pericholecystic fluid, or positive sonographic Murphy sign reported by the sonographer. 1.2 cm stone in the gallbladder fundus. Common bile duct: Diameter: 1.6 mm Liver: No focal lesion identified. Diffusely increased hepatic parenchymal echogenicity, compatible with hepatic steatosis. Portal vein is patent on color Doppler imaging with normal direction of blood flow towards the liver. Other: None. IMPRESSION: 1. Cholelithiasis without sonographic evidence of acute cholecystitis. 2. Hepatic steatosis. Electronically Signed   By: Harrietta Sherry M.D.   On: 05/16/2024 15:27    Pertinent labs & imaging results that were available during my care of the patient were reviewed by me and considered in my medical decision making (see MDM for details).  Medications Ordered in ED Medications  lactated ringers  bolus 1,000 mL (has no administration in time range)   ondansetron  (ZOFRAN ) injection 4 mg (has no administration in time range)  LORazepam  (ATIVAN ) tablet 1-4 mg (has no administration in time range)    Or  LORazepam  (ATIVAN ) injection 1-4 mg (has no administration in time range)  thiamine  (VITAMIN B1) tablet 100 mg (has no administration in time range)    Or  thiamine  (VITAMIN B1) injection 100 mg (has no administration in time range)  folic acid  (FOLVITE ) tablet 1 mg (has no administration in time range)  multivitamin with minerals tablet 1 tablet (has no administration in time range)  hydrOXYzine  (ATARAX ) tablet 25 mg (25 mg Oral Given 05/16/24 1407)  ondansetron  (ZOFRAN -ODT) disintegrating tablet 4 mg (4 mg Oral Given 05/16/24 1407)  Procedures Procedures  (including critical care time)  Medical Decision Making / ED Course   This patient presents to the ED for concern of ***, this involves an extensive number of treatment options, and is a complaint that carries with it a high risk of complications and morbidity.  The differential diagnosis includes ***  MDM: ***   Additional history obtained: -Additional history obtained from *** -External records from outside source obtained and reviewed including: Chart review including previous notes, labs, imaging, consultation notes   Lab Tests: -I ordered, reviewed, and interpreted labs.   The pertinent results include:   Labs Reviewed  CBC WITH DIFFERENTIAL/PLATELET - Abnormal; Notable for the following components:      Result Value   RBC 3.77 (*)    Hemoglobin 11.7 (*)    Platelets 112 (*)    All other components within normal limits  COMPREHENSIVE METABOLIC PANEL WITH GFR - Abnormal; Notable for the following components:   Chloride 95 (*)    CO2 9 (*)    Glucose, Bld 117 (*)    BUN 5 (*)    Creatinine, Ser 1.06 (*)    Total Protein 8.2 (*)     AST 161 (*)    ALT 66 (*)    Total Bilirubin 1.5 (*)    Anion gap 36 (*)    All other components within normal limits  LIPASE, BLOOD - Abnormal; Notable for the following components:   Lipase 264 (*)    All other components within normal limits  URINALYSIS, ROUTINE W REFLEX MICROSCOPIC - Abnormal; Notable for the following components:   APPearance HAZY (*)    Hgb urine dipstick SMALL (*)    Ketones, ur 80 (*)    Protein, ur 100 (*)    Bacteria, UA RARE (*)    All other components within normal limits  ETHANOL - Abnormal; Notable for the following components:   Alcohol, Ethyl (B) 38 (*)    All other components within normal limits  RAPID URINE DRUG SCREEN, HOSP PERFORMED - Abnormal; Notable for the following components:   Tetrahydrocannabinol POSITIVE (*)    All other components within normal limits  HCG, SERUM, QUALITATIVE  BLOOD GAS, VENOUS  BETA-HYDROXYBUTYRIC ACID  I-STAT CG4 LACTIC ACID, ED      EKG ***  EKG Interpretation Date/Time:    Ventricular Rate:    PR Interval:    QRS Duration:    QT Interval:    QTC Calculation:   R Axis:      Text Interpretation:           Imaging Studies ordered: I ordered imaging studies including *** I independently visualized and interpreted imaging. I agree with the radiologist interpretation   Medicines ordered and prescription drug management: Meds ordered this encounter  Medications   hydrOXYzine  (ATARAX ) tablet 25 mg   ondansetron  (ZOFRAN -ODT) disintegrating tablet 4 mg   lactated ringers  bolus 1,000 mL   DISCONTD: morphine  (PF) 4 MG/ML injection 4 mg   ondansetron  (ZOFRAN ) injection 4 mg   OR Linked Order Group    LORazepam  (ATIVAN ) tablet 1-4 mg     CIWA-AR < 5 =:   0 mg     CIWA-AR 5 -10 =:   1 mg     CIWA-AR 11 -15 =:   2 mg     CIWA-AR 16 -20 =:   3 mg     CIWA-AR 16 -20 =:   Recheck CIWA-AR in 1 hour; if > 20 notify MD  CIWA-AR > 20 =:   4 mg     CIWA-AR > 20 =:   Call Rapid Response    LORazepam   (ATIVAN ) injection 1-4 mg     CIWA-AR < 5 =:   0 mg     CIWA-AR 5 -10 =:   1 mg     CIWA-AR 11 -15 =:   2 mg     CIWA-AR 16 -20 =:   3 mg     CIWA-AR 16 -20 =:   Recheck CIWA-AR in 1 hour; if > 20 notify MD     CIWA-AR > 20 =:   4 mg     CIWA-AR > 20 =:   Call Rapid Response   OR Linked Order Group    thiamine  (VITAMIN B1) tablet 100 mg    thiamine  (VITAMIN B1) injection 100 mg   folic acid  (FOLVITE ) tablet 1 mg   multivitamin with minerals tablet 1 tablet    -I have reviewed the patients home medicines and have made adjustments as needed  Critical interventions ***  Consultations Obtained: I requested consultation with the ***,  and discussed lab and imaging findings as well as pertinent plan - they recommend: ***   Cardiac Monitoring: The patient was maintained on a cardiac monitor.  I personally viewed and interpreted the cardiac monitored which showed an underlying rhythm of: ***  Social Determinants of Health:  Factors impacting patients care include: ***   Reevaluation: After the interventions noted above, I reevaluated the patient and found that they have :{resolved/improved/worsened:23923::improved}  Co morbidities that complicate the patient evaluation  Past Medical History:  Diagnosis Date   Anxiety    Anxiety    Asthma    long  time since used rescue inhaler   GERD (gastroesophageal reflux disease)    Post partum depression       Dispostion: I considered admission for this patient, ***     Final Clinical Impression(s) / ED Diagnoses Final diagnoses:  None     @PCDICTATION @

## 2024-05-16 NOTE — ED Provider Triage Note (Signed)
 Emergency Medicine Provider Triage Evaluation Note  Deborah Juarez , a 33 y.o. female  was evaluated in triage.  Pt complains of abd pain. Endorse upper abd pain ongoing x 2 weeks.  Endorse nausea, vomiting and decreased urine outpt.  Hx of alcohol abuse and gallstone.  Does use marijuana on occasion.  Report having a panic attack  Review of Systems  Positive: As above Negative: As above  Physical Exam  BP (!) 140/99 (BP Location: Left Arm)   Pulse (!) 116   Temp 97.6 F (36.4 C)   Resp (!) 21   Ht 5' 7 (1.702 m)   Wt 63.5 kg   SpO2 100%   Breastfeeding No   BMI 21.93 kg/m  Gen:   Awake, appears uncomfortable, retching Resp:  Normal effort  MSK:   Moves extremities without difficulty  Other:    Medical Decision Making  Medically screening exam initiated at 1:33 PM.  Appropriate orders placed.  Deborah Juarez was informed that the remainder of the evaluation will be completed by another provider, this initial triage assessment does not replace that evaluation, and the importance of remaining in the ED until their evaluation is complete.     Nivia Colon, PA-C 05/16/24 1334

## 2024-05-16 NOTE — H&P (Addendum)
 History and Physical    Deborah Juarez FMW:[id number] DOB: 29-Jun-1991 DOA: 05/16/2024  PCP: Verena Mems, MD   Patient coming from: Home   Chief Complaint:  Chief Complaint  Patient presents with   Abdominal Pain   ED TRIAGE note:  Pt has been vomiting for a month and for the past 3 days has not been able to eat. C/O RUQ abd pain. Pt states having a hard time urinating. C/O nausea. Denies diarrhea. C/O CP       HPI:  Deborah Juarez is a 33 y.o. female with medical history significant of chronic alcohol use disorder, recurrent hospital admission from chronic alcohol use disorder associated starvation ketoacidosis, anxiety, GERD, and asthma presented emergency department with complaining of abdominal pain with associated nausea and vomiting.  Patient reported that she has been drinking 1 pint of vodka on daily basis and last drink was 2 days ago.  Patient reported that she has been vomiting for last 1 month and for the last few days she has not eaten anything.  Patient is presenting to ED with tremor, anxiety, epigastric and lower abdominal discomfort.  Patient reported that she would like to get help for alcohol use disorder and she is very stressed at home. Did not any chest pain, palpitation, shortness of breath, change reported in appetite. Patient denies any hematemesis and melena.  Denies any constipation and diarrhea.   ED Course:  At presentation to ED patient is tachycardic, tachypneic and hypertensive.  O2 sat 100% room air. VBG showing pH 7.2, low bicarb 11, low pCO2 24, PO2 186. Elevated lactic acid 7.5. Elevated beta-hydroxybutyrate level above 8. UDS positive with THC. Elevated blood alcohol level 38. Pregnancy test negative. UA showing hazy appearance, dipstick hemoglobin positive, ketone 80, protein 100 and rare bacteria. Elevated lipase level 264. CMP showing low 9, elevated anion gap 36, elevated creatinine 1.06, elevated AST/ALT 161/66, normal  ALP, elevated bilirubin level 1.5.  Normal potassium and calcium level. CBC unremarkable stable H&H normal WBC and low platelet count.  Right upper quadrant ultrasound showing cholelithiasis without any sonographic evidence of acute cholecystitis and hepatic steatosis.  In the ED patient has been given 2 L of LR bolus, thiamine , folic acid , Ativan  dapper doses started in the setting of alcohol withdrawal.  Getting 2 L LR bolus in the ED patient's pH has been improved 7.5-1.5.  Hospitalist has been consulted for management of starvation/diabetic ketoacidosis, intractable nausea vomiting in the setting of alcohol use, acute kidney injury, metabolic acidosis, elevated lactic acidosis secondary to dehydration, alcoholic pancreatitis, and marijuana use.   Significant labs in the ED: Lab Orders         CBC with Differential         Comprehensive metabolic panel         Lipase, blood         Urinalysis, Routine w reflex microscopic -Urine, Clean Catch         hCG, serum, qualitative         Ethanol         Rapid urine drug screen (hospital performed)         Beta-hydroxybutyric acid         Basic metabolic panel         Beta-hydroxybutyric acid         Lactic acid, plasma         Comprehensive metabolic panel         CBC  Hepatitis panel, acute         Urinalysis, Routine w reflex microscopic -Urine, Clean Catch         HIV Antibody (routine testing w rflx)         Gamma GT         I-Stat CG4 Lactic Acid         I-Stat venous blood gas, ([facility], [facility], [facility])       Review of Systems:  Review of Systems  Constitutional:  Negative for chills, fever, malaise/fatigue and weight loss.  Respiratory:  Negative for cough, sputum production and shortness of breath.   Cardiovascular:  Negative for chest pain and palpitations.  Gastrointestinal:  Positive for abdominal pain, nausea and vomiting. Negative for blood in stool, constipation, diarrhea, heartburn and melena.  Genitourinary:   Negative for dysuria, frequency and urgency.  Skin:  Negative for itching and rash.  Neurological:  Negative for dizziness and headaches.  Psychiatric/Behavioral:  The patient is not nervous/anxious.     Past Medical History:  Diagnosis Date   Anxiety    Anxiety    Asthma    long  time since used rescue inhaler   GERD (gastroesophageal reflux disease)    Post partum depression     Past Surgical History:  Procedure Laterality Date   BREAST SURGERY     CESAREAN SECTION N/A 03/03/2015   Procedure: CESAREAN SECTION;  Surgeon: Delon Prude, DO;  Location: [facility];  Service: Obstetrics;  Laterality: N/A;     reports that she has never smoked. She has never used smokeless tobacco. She reports that she does not currently use alcohol after a past usage of about 3.0 standard drinks of alcohol per week. She reports that she does not use drugs.  Allergies  Allergen Reactions   Protonix  [Pantoprazole ] Other (See Comments)    Increased/worsened acid reflux    Family History  Problem Relation Age of Onset   Cancer Father        Pacreatic Ca   Heart disease Father    Heart disease Maternal Grandfather    Heart disease Paternal Grandfather    Stroke Paternal Grandfather     Prior to Admission medications   Medication Sig Start Date End Date Taking? Authorizing Provider  hydrOXYzine  (ATARAX ) 25 MG tablet Take 1 tablet (25 mg total) by mouth 3 (three) times daily as needed. 01/18/24  Yes Samtani, Jai-Gurmukh, MD  medroxyPROGESTERone  Acetate 150 MG/ML SUSY Inject 1 mL (150 mg total) into the muscle every 3 (three) months. 08/27/23  Yes   ondansetron  (ZOFRAN ) 4 MG tablet Take 1 tablet (4 mg total) by mouth every 6 (six) hours as needed for nausea. 01/18/24  Yes Samtani, Jai-Gurmukh, MD  QUEtiapine  (SEROQUEL ) 100 MG tablet Take 1 tablet (100 mg total) by mouth at bedtime. Patient taking differently: Take 50 mg by mouth at bedtime. 01/18/24  Yes Royal Sill, MD     Physical  Exam: Vitals:   05/16/24 2015 05/16/24 2030 05/16/24 2130 05/16/24 2245  BP: 128/88 126/89 (!) 125/90 130/84  Pulse: 100 99 97 100  Resp:  17 18 18   Temp:  97.9 F (36.6 C) 98 F (36.7 C)   TempSrc:  Axillary Oral   SpO2:  100% 100% 100%  Weight:      Height:        Physical Exam Vitals and nursing note reviewed.  Constitutional:      Appearance: She is not ill-appearing.   Cardiovascular:  Rate and Rhythm: Regular rhythm. Tachycardia present.     Heart sounds: Normal heart sounds.  Abdominal:     General: Abdomen is scaphoid. Bowel sounds are normal. There is no distension.     Palpations: Abdomen is soft. There is no hepatomegaly or splenomegaly.     Tenderness: There is abdominal tenderness in the epigastric area. There is no guarding or rebound.   Skin:    General: Skin is dry.     Capillary Refill: Capillary refill takes less than 2 seconds.   Neurological:     Mental Status: She is alert and oriented to person, place, and time.      Labs on Admission: I have personally reviewed following labs and imaging studies  CBC: Recent Labs  Lab 05/16/24 1333 05/16/24 1832  WBC 6.8  --   NEUTROABS 5.3  --   HGB 11.7* 13.6  HCT 37.5 40.0  MCV 99.5  --   PLT 112*  --    Basic Metabolic Panel: Recent Labs  Lab 05/16/24 1333 05/16/24 1832  NA 140 134*  K 3.6 4.0  CL 95*  --   CO2 9*  --   GLUCOSE 117*  --   BUN 5*  --   CREATININE 1.06*  --   CALCIUM 9.5  --    GFR: Estimated Creatinine Clearance: 72.7 mL/min (A) (by C-G formula based on SCr of 1.06 mg/dL (H)). Liver Function Tests: Recent Labs  Lab 05/16/24 1333  AST 161*  ALT 66*  ALKPHOS 62  BILITOT 1.5*  PROT 8.2*  ALBUMIN 4.6   Recent Labs  Lab 05/16/24 1333  LIPASE 264*   No results for input(s): AMMONIA in the last 168 hours. Coagulation Profile: No results for input(s): INR, PROTIME in the last 168 hours. Cardiac Enzymes: No results for input(s): CKTOTAL, CKMB,  CKMBINDEX, TROPONINI, TROPONINIHS in the last 168 hours. BNP (last 3 results) No results for input(s): BNP in the last 8760 hours. HbA1C: No results for input(s): HGBA1C in the last 72 hours. CBG: No results for input(s): GLUCAP in the last 168 hours. Lipid Profile: No results for input(s): CHOL, HDL, LDLCALC, TRIG, CHOLHDL, LDLDIRECT in the last 72 hours. Thyroid Function Tests: No results for input(s): TSH, T4TOTAL, FREET4, T3FREE, THYROIDAB in the last 72 hours. Anemia Panel: No results for input(s): VITAMINB12, FOLATE, FERRITIN, TIBC, IRON, RETICCTPCT in the last 72 hours. Urine analysis:    Component Value Date/Time   COLORURINE YELLOW 05/16/2024 1333   APPEARANCEUR HAZY (A) 05/16/2024 1333   LABSPEC 1.017 05/16/2024 1333   PHURINE 6.0 05/16/2024 1333   GLUCOSEU NEGATIVE 05/16/2024 1333   HGBUR SMALL (A) 05/16/2024 1333   BILIRUBINUR NEGATIVE 05/16/2024 1333   KETONESUR 80 (A) 05/16/2024 1333   PROTEINUR 100 (A) 05/16/2024 1333   UROBILINOGEN 1.0 08/10/2014 1739   NITRITE NEGATIVE 05/16/2024 1333   LEUKOCYTESUR NEGATIVE 05/16/2024 1333    Radiological Exams on Admission: I have personally reviewed images CT ABDOMEN WO CONTRAST Result Date: 05/16/2024 CLINICAL DATA:  Abdominal pain.  Rule out gallstone pancreatitis EXAM: CT ABDOMEN WITHOUT CONTRAST TECHNIQUE: Multidetector CT imaging of the abdomen was performed following the standard protocol without IV contrast. RADIATION DOSE REDUCTION: This exam was performed according to the departmental dose-optimization program which includes automated exposure control, adjustment of the mA and/or kV according to patient size and/or use of iterative reconstruction technique. COMPARISON:  Same day abdominal ultrasound and CT abdomen pelvis 11/02/2023 FINDINGS: Lower chest: No acute abnormality. Hepatobiliary: Marked hepatic steatosis.  Sludge and stones in the gallbladder without wall thickening  or pericholecystic fluid to suggest cholecystitis. No biliary dilation or radiopaque choledocholithiasis. Pancreas: Fluid about the head of the pancreas and descending duodenum. 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 No pancreatic ductal dilation. Spleen: Unremarkable. Adrenals/Urinary Tract: Normal adrenal glands. Punctate nonobstructing stones in the right kidney. No hydronephrosis. Stomach/Bowel: Wall thickening about the distal esophagus compatible with esophagitis. Stomach is within normal limits. Edema and wall thickening about the descending duodenum. This may be related to groove pancreatitis or primary duodenitis. Mural fatty infiltration within the ascending and proximal transverse colon can be a normal finding or due to sequela of chronic inflammation. No evidence of active colitis. Normal appendix. Vascular/Lymphatic: No significant vascular findings are present. No enlarged abdominal or pelvic lymph nodes. Other: No organized fluid collection.  No free intraperitoneal air. Musculoskeletal: No acute fracture. IMPRESSION: 1. Fluid about the head of the pancreas and thick-walled descending duodenum. 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 2. Sludge and stones in the gallbladder without evidence of cholecystitis. No biliary dilation. 3. Marked hepatic steatosis. 4. Punctate nonobstructing stones in the right kidney. 5. Similar distal esophagitis. Electronically Signed   By: Norman Gatlin M.D.   On: 05/16/2024 22:47   US  Abdomen Limited Result Date: 05/16/2024 CLINICAL DATA:  Abdominal pain. EXAM: ULTRASOUND ABDOMEN LIMITED RIGHT UPPER QUADRANT COMPARISON:  04/20/2023. FINDINGS: Gallbladder: No gallbladder wall thickening, pericholecystic fluid, or positive sonographic Murphy sign reported by the sonographer. 1.2 cm stone in the gallbladder fundus. Common bile duct: Diameter: 1.6 mm Liver: No focal lesion identified. Diffusely increased hepatic parenchymal  echogenicity, compatible with hepatic steatosis. Portal vein is patent on color Doppler imaging with normal direction of blood flow towards the liver. Other: None. IMPRESSION: 1. Cholelithiasis without sonographic evidence of acute cholecystitis. 2. Hepatic steatosis. Electronically Signed   By: Harrietta Sherry M.D.   On: 05/16/2024 15:27     EKG: My personal interpretation of EKG shows: Sinus tachycardia heart rate 23 and prolonged QTc 472 ms.    Assessment/Plan: Principal Problem:   Ketoacidosis due to acute alcohol intoxication (HCC) Active Problems:   Lactic acidosis   Alcoholic ketoacidosis   Intractable nausea and vomiting   Alcohol withdrawal (HCC)   AKI (acute kidney injury) (HCC)   Alcoholic pancreatitis   Cholelithiasis   Marijuana use   QT prolongation   Nausea & vomiting   Thrombocytopenia (HCC)   GAD (generalized anxiety disorder)    Assessment and Plan: Alcoholic ketoacidosis Starvation ketoacidosis Lactic acidosis-in the setting of dehydration and decreased hepatic clearance from underlying chronic transaminitis Intractable nausea vomiting Alcoholic pancreatitis -Patient came to emergency department complaining of intractable nausea vomiting with associated poor oral intake for last few days.  Patient stated that she has continue vomiting for last 1 month however she continues to drink 1 pint of vodka on daily basis and continues to drink as well except for last 2 days due to vomiting has not been drinking much.  At presentation to ED patient has anxiety, tremor, tachypneic and borderline hypertensive.  Exhibiting symptoms of alcohol withdrawal - CMP showed low bicarb 9, elevated anion gap 36.  Evidence of AKI creatinine 1.06.  Elevated AST/ALT and bilirubin level.  Normal ALP 62.  Elevated lipase 264.  VBG showed normal pCO2 7.2, low bicarb 11.  Elevated beta-hydroxybutyrate above 8. -Initial lactic acid 7.5 has been improved to 5.1 after leaving 2 L of LR bolus in  the ED.  Lactic acidosis in the setting of dehydration and decreased hepatic clearance from chronic transaminitis - Starvation and alcoholic ketoacidosis in the setting of intractable nausea vomiting, poor oral intake and continue to drink alcohol. - In the ED received total 3 L of LR bolus.  -Starting dextrose  drip with LR 150 cc/h which will improve to increase insulin secretion and in terms it will improve bicarb level and will decrease ketoacidosis. -Giving thiamine  100 mg stat before initiation of dextrose  drip. -Right upper quadrant ultrasound showed cholelithiasis without evidence of acute cholecystitis and hepatic steatosis. -Also concern for alcoholic pancreatitis. -Checking hepatitis panel. - Continue pain and nausea control. -Starting clear liquid diet as patient tolerates. - On discharge patient need to refer to general surgery for elective cholecystectomy. Addendum - Repeat BMP at midnight showing bicarb level has been improved 9-20 and anion gap has been closed to 36-15.  As acidosis has been improved and anion gap has been closed changing D5- LR to LR 125 cc/h.   Acute alcohol withdrawal History of chronic alcohol abuse -Continue CIWA protocol, Ativan  as needed tapper, thiamine  folic acid  and multivitamin. - Counseled has provided at the bedside for alcohol cessation.  Consult transition care team  Acute kidney injury -Elevated creatinine 1.06. - Prerenal acute kidney injury in the setting of dehydration from poor oral intake and nausea vomiting.  Resuscitated with 3 L of LR bolus.  Continue maintenance fluid dextrose  drip. -Continue monitor improvement of renal function.  Avoid nephrotoxic agent  Alcoholic pancreatitis Cholelithiasis -Alcoholic pancreatitis in the setting of alcohol use.  Hepatic ultrasound showed cholelithiasis without evidence of Coley cystitis. - Given patient has elevated AST/ALT as normal ALP level there is no concern for biliary  involvement. -Obtaining CT abdomen pelvis. -In the ED patient has been received 3 L of LR bolus - Continue maintenance fluid D5 W with LR 150 cc/h. -Continue clear liquid diet - Continue to monitor improvement of pain and nausea. - Consulted [facility] Dr. Charlanne. -Also consulted general surgery Dr. Ann. Addendum - CT abdomen pelvis showing development of groove pancreatitis or primary duodenitis.  Sludge of the stone in the gallbladder without any evidence of cholecystitis or biliary dilation.  Mild hepatic steatosis.  Similar distal esophagitis. -Consulted GI for further recommendation. - Continue IV famotidine  daily for the management of acute duodenitis and distal esophagitis.  Patient is allergic to Protonix .  Marijuana use -UDS positive for marijuana.  Counseling has been provided for marijuana cessation  Thrombocytopenia -CBC showing low platelet count to 12.  Continue to monitor  Generalized anxiety disorder -Continue Atarax  as needed  Prolonged QTc -History of prolonged QTc.  Need to avoid further QTc prolonging medication     DVT prophylaxis:  Lovenox  Code Status:  Full Code Diet: Clear liquid diet Family Communication:   Family was present at bedside, at the time of interview. Opportunity was given to ask question and all questions were answered satisfactorily.  Disposition Plan: Culture monitor improvement of bicarb level, anion gap, beta-hydroxybutyrate and lactic acid level.  Continue monitor improvement of abdominal pain. Consults:  Admission status:   Inpatient, Telemetry bed  Severity of Illness: The appropriate patient status for this patient is INPATIENT. Inpatient status is judged to be reasonable and necessary in order to provide the required intensity of service to ensure the patient's safety. The patient's presenting symptoms, physical exam findings, and initial radiographic and laboratory data in the context of their chronic comorbidities is felt to  place them at high risk for  further clinical deterioration. Furthermore, it is not anticipated that the patient will be medically stable for discharge from the hospital within 2 midnights of admission.   * I certify that at the point of admission it is my clinical judgment that the patient will require inpatient hospital care spanning beyond 2 midnights from the point of admission due to high intensity of service, high risk for further deterioration and high frequency of surveillance required.DEWAINE    Sayward Horvath, MD [facility]  How to contact the [facility] Attending or Consulting provider 7A - 7P or covering provider during after hours 7P -7A, for this patient.  Check the care team in [facility] and look for a) attending/consulting [facility] provider listed and b) the [facility] team listed Log into [email or web address] and use [facility]'s universal password to access. If you do not have the password, please contact the hospital operator. Locate the [facility] provider you are looking for under [facility] and page to a number that you can be directly reached. If you still have difficulty reaching the provider, please page the [facility] (Director on Call) for the Hospitalists listed on amion for assistance.  05/16/2024, 11:15 PM

## 2024-05-16 NOTE — Discharge Instructions (Signed)

## 2024-05-16 NOTE — ED Notes (Signed)
 Pt pain is increasing.  Pt is actively vomiting and hyperventalating.  Coached on slowing breathing and given clean emesis back.

## 2024-05-16 NOTE — TOC Initial Note (Signed)
 Transition of Care [facility]) - Initial/Assessment Note    Patient Details  Name: Deborah Juarez MRN: [id number] Date of Birth: 1991/10/05  Transition of Care [facility]) CM/SW Contact:    Hartley KATHEE Robertson, LCSWA Phone Number: 05/16/2024, 7:07 PM  Clinical Narrative:                  Substance resources added to AVS.         Patient Goals and CMS Choice            Expected Discharge Plan and Services                                              Prior Living Arrangements/Services                       Activities of Daily Living      Permission Sought/Granted                  Emotional Assessment              Admission diagnosis:  abd pain sob Patient Active Problem List   Diagnosis Date Noted   Starvation ketoacidosis 01/17/2024   Intractable nausea and vomiting 04/19/2023   Acute gastroenteritis 12/26/2022   Abnormal ECG 12/26/2022   Palpitations 12/26/2022   Prolonged QT interval 12/26/2022   Metabolic acidosis 12/26/2022   Transaminitis 12/26/2022   Depressive disorder 12/18/2022   Normal labor 09/06/2022   Indication for care in labor and delivery, antepartum 09/05/2022   Asthma 03/05/2015   Anxiety 03/05/2015   Status post primary low transverse cesarean section--malpresentation 03/03/2015   PCP:  Verena Mems, MD Pharmacy:   [facility] [phone], [place] - [address] AT [facility] [address] [place] [address] Phone: [phone] Fax: [phone]  [facility] [address] [place] [address] Phone: [phone] Fax: [phone]  [facility] [address] [place] [address] Phone: [phone] Fax: [phone]     Social Drivers of Health (SDOH) Social History: SDOH Screenings   Food Insecurity: No Food Insecurity (01/17/2024)  Housing: Low Risk  (01/17/2024)   Transportation Needs: No Transportation Needs (01/17/2024)  Utilities: Not At Risk (01/17/2024)  Tobacco Use: Low Risk  (05/16/2024)   SDOH Interventions:     Readmission Risk Interventions     No data to display

## 2024-05-16 NOTE — ED Triage Notes (Signed)
 Pt has been vomiting for a month and for the past 3 days has not been able to eat. C/O RUQ abd pain. Pt states having a hard time urinating. C/O nausea. Denies diarrhea. C/O CP

## 2024-05-16 NOTE — ED Notes (Signed)
Pt ambulated to the bathroom. Pt able to walk independently.

## 2024-05-16 NOTE — ED Notes (Signed)
 Patient transported to CT

## 2024-05-17 DIAGNOSIS — D696 Thrombocytopenia, unspecified: Secondary | ICD-10-CM | POA: Diagnosis not present

## 2024-05-17 DIAGNOSIS — K859 Acute pancreatitis without necrosis or infection, unspecified: Secondary | ICD-10-CM | POA: Diagnosis not present

## 2024-05-17 DIAGNOSIS — E8729 Other acidosis: Secondary | ICD-10-CM | POA: Diagnosis not present

## 2024-05-17 DIAGNOSIS — F10929 Alcohol use, unspecified with intoxication, unspecified: Secondary | ICD-10-CM | POA: Diagnosis not present

## 2024-05-17 LAB — LACTIC ACID, PLASMA
Lactic Acid, Venous: 1.9 mmol/L (ref 0.5–1.9)
Lactic Acid, Venous: 1.3 mmol/L (ref 0.5–1.9)

## 2024-05-17 LAB — BETA-HYDROXYBUTYRIC ACID: Beta-Hydroxybutyric Acid: 2.77 mmol/L — ABNORMAL HIGH (ref 0.05–0.27)

## 2024-05-17 LAB — CBC
HCT: 35.7 % — ABNORMAL LOW (ref 36.0–46.0)
Hemoglobin: 11.8 g/dL — ABNORMAL LOW (ref 12.0–15.0)
MCH: 31.2 pg (ref 26.0–34.0)
MCHC: 33.1 g/dL (ref 30.0–36.0)
MCV: 94.4 fL (ref 80.0–100.0)
Platelets: 85 10*3/uL — ABNORMAL LOW (ref 150–400)
RBC: 3.78 MIL/uL — ABNORMAL LOW (ref 3.87–5.11)
RDW: 13.7 % (ref 11.5–15.5)
WBC: 6.9 10*3/uL (ref 4.0–10.5)
nRBC: 0 % (ref 0.0–0.2)

## 2024-05-17 LAB — GLUCOSE, CAPILLARY: Glucose-Capillary: 155 mg/dL — ABNORMAL HIGH (ref 70–99)

## 2024-05-17 LAB — BASIC METABOLIC PANEL WITH GFR
Anion gap: 15 (ref 5–15)
BUN: 5 mg/dL — ABNORMAL LOW (ref 6–20)
CO2: 20 mmol/L — ABNORMAL LOW (ref 22–32)
Calcium: 8.5 mg/dL — ABNORMAL LOW (ref 8.9–10.3)
Chloride: 96 mmol/L — ABNORMAL LOW (ref 98–111)
Creatinine, Ser: 0.85 mg/dL (ref 0.44–1.00)
GFR, Estimated: >60 mL/min (ref >60)
Glucose, Bld: 186 mg/dL — ABNORMAL HIGH (ref 70–99)
Potassium: 3.8 mmol/L (ref 3.5–5.1)
Sodium: 131 mmol/L — ABNORMAL LOW (ref 135–145)

## 2024-05-17 LAB — COMPREHENSIVE METABOLIC PANEL WITH GFR
ALT: 46 U/L — ABNORMAL HIGH (ref 0–44)
AST: 82 U/L — ABNORMAL HIGH (ref 15–41)
Albumin: 3.4 g/dL — ABNORMAL LOW (ref 3.5–5.0)
Alkaline Phosphatase: 52 U/L (ref 38–126)
Anion gap: 18 — ABNORMAL HIGH (ref 5–15)
BUN: 5 mg/dL — ABNORMAL LOW (ref 6–20)
CO2: 19 mmol/L — ABNORMAL LOW (ref 22–32)
Calcium: 8.6 mg/dL — ABNORMAL LOW (ref 8.9–10.3)
Chloride: 97 mmol/L — ABNORMAL LOW (ref 98–111)
Creatinine, Ser: 0.77 mg/dL (ref 0.44–1.00)
GFR, Estimated: >60 mL/min (ref >60)
Glucose, Bld: 113 mg/dL — ABNORMAL HIGH (ref 70–99)
Potassium: 4.1 mmol/L (ref 3.5–5.1)
Sodium: 134 mmol/L — ABNORMAL LOW (ref 135–145)
Total Bilirubin: 2.2 mg/dL — ABNORMAL HIGH (ref 0.0–1.2)
Total Protein: 6.1 g/dL — ABNORMAL LOW (ref 6.5–8.1)

## 2024-05-17 LAB — HEPATITIS PANEL, ACUTE
HCV Ab: NONREACTIVE
Hep A IgM: NONREACTIVE
Hep B C IgM: NONREACTIVE
Hepatitis B Surface Ag: NONREACTIVE

## 2024-05-17 LAB — GAMMA GT: GGT: 136 U/L — ABNORMAL HIGH (ref 7–50)

## 2024-05-17 MED ORDER — LORAZEPAM 1 MG PO TABS
0.0000 mg | ORAL_TABLET | Freq: Four times a day (QID) | ORAL | Status: DC
  Administered 2024-05-17 – 2024-05-18 (×3): 2 mg via ORAL
  Administered 2024-05-18: 1 mg via ORAL
  Filled 2024-05-17 (×4): qty 2

## 2024-05-17 MED ORDER — LORAZEPAM 1 MG PO TABS: 0.0000 mg | ORAL_TABLET | Freq: Two times a day (BID) | ORAL | Status: DC

## 2024-05-17 MED ORDER — LACTATED RINGERS IV BOLUS
1000.0000 mL | Freq: Once | INTRAVENOUS | Status: AC
  Administered 2024-05-17: 1000 mL via INTRAVENOUS

## 2024-05-17 MED ORDER — BOOST / RESOURCE BREEZE PO LIQD CUSTOM
1.0000 | Freq: Three times a day (TID) | ORAL | Status: DC
  Administered 2024-05-17 (×2): 1 via ORAL

## 2024-05-17 MED ORDER — LACTATED RINGERS IV SOLN: INTRAVENOUS | Status: AC

## 2024-05-17 MED ORDER — METOPROLOL TARTRATE 5 MG/5ML IV SOLN
2.5000 mg | Freq: Once | INTRAVENOUS | Status: AC
  Administered 2024-05-17: 2.5 mg via INTRAVENOUS
  Filled 2024-05-17: qty 5

## 2024-05-17 MED ORDER — ALUM & MAG HYDROXIDE-SIMETH 200-200-20 MG/5ML PO SUSP
30.0000 mL | Freq: Four times a day (QID) | ORAL | Status: DC | PRN
  Administered 2024-05-17: 30 mL via ORAL
  Filled 2024-05-17: qty 30

## 2024-05-17 NOTE — Consult Note (Signed)
 Reason for Consult:  cholelithiasis, concern for possible gallstone pancreatitis v alcohol pancreatitis Referring Provider: Lee, MD  HPI  Deborah Juarez is an 33 y.o. female with history of alcohol abuse disorder, anxiety, GERD, asthma, cannabinoid hyperemesis syndrome, history of alcohol associated starvation ketoacidosis who presents to ED with abdominal pain, nausea, and emesis.  Patient has heavy alcohol use,1 pint of vodka per day, last drink 2 days ago.  Patient has been having issues with emesis over the last month and has not eaten anything for the last several days.  Workup notable for cholelithiasis without any signs of gallbladder wall thickening, pericholecystic fluid, or Murphy sign.  Common bile duct within normal limits.  Additional imaging concerning for esophagitis, acute pancreatitis and primary duodenitis versus groove pancreatitis.  Labs notable for hyponatremia, hypochloremia, metabolic acidosis, elevated lipase to 264, elevated AST/ALT/alk phos of 161/66/62 respectively.  Total bilirubin elevated to 1.5.  Patient does state that she has had issues with nausea and right upper quadrant pain after heavy meals in the past, but that is not associated with this current episode of abdominal pain, nausea.  10 point review of systems is negative except as listed above in HPI.  Objective  Past Medical History: Past Medical History:  Diagnosis Date   Anxiety    Anxiety    Asthma    long  time since used rescue inhaler   GERD (gastroesophageal reflux disease)    Post partum depression     Past Surgical History: Past Surgical History:  Procedure Laterality Date   BREAST SURGERY     CESAREAN SECTION N/A 03/03/2015   Procedure: CESAREAN SECTION;  Surgeon: Delon Prude, DO;  Location: [facility];  Service: Obstetrics;  Laterality: N/A;    Family History:  Family History  Problem Relation Age of Onset   Cancer Father        Pacreatic Ca   Heart disease  Father    Heart disease Maternal Grandfather    Heart disease Paternal Grandfather    Stroke Paternal Grandfather     Social History:  reports that she has never smoked. She has never used smokeless tobacco. She reports that she does not currently use alcohol after a past usage of about 3.0 standard drinks of alcohol per week. She reports that she does not use drugs.  Allergies:  Allergies  Allergen Reactions   Protonix  [Pantoprazole ] Other (See Comments)    Increased/worsened acid reflux    Medications: I have reviewed the patient's current medications.  Labs: I have personally reviewed all labs for the past 24h  Imaging: I have personally reviewed and interpreted all imaging for the past 24h and agree with the radiologist's impression.  CT ABDOMEN WO CONTRAST Result Date: 05/16/2024 CLINICAL DATA:  Abdominal pain.  Rule out gallstone pancreatitis EXAM: CT ABDOMEN WITHOUT CONTRAST TECHNIQUE: Multidetector CT imaging of the abdomen was performed following the standard protocol without IV contrast. RADIATION DOSE REDUCTION: This exam was performed according to the departmental dose-optimization program which includes automated exposure control, adjustment of the mA and/or kV according to patient size and/or use of iterative reconstruction technique. COMPARISON:  Same day abdominal ultrasound and CT abdomen pelvis 11/02/2023 FINDINGS: Lower chest: No acute abnormality. Hepatobiliary: Marked hepatic steatosis. Sludge and stones in the gallbladder without wall thickening or pericholecystic fluid to suggest cholecystitis. No biliary dilation or radiopaque choledocholithiasis. Pancreas: Fluid about the head of the pancreas and descending duodenum. 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 No pancreatic  ductal dilation. Spleen: Unremarkable. Adrenals/Urinary Tract: Normal adrenal glands. Punctate nonobstructing stones in the right kidney. No hydronephrosis. Stomach/Bowel: Wall  thickening about the distal esophagus compatible with esophagitis. Stomach is within normal limits. Edema and wall thickening about the descending duodenum. This may be related to groove pancreatitis or primary duodenitis. Mural fatty infiltration within the ascending and proximal transverse colon can be a normal finding or due to sequela of chronic inflammation. No evidence of active colitis. Normal appendix. Vascular/Lymphatic: No significant vascular findings are present. No enlarged abdominal or pelvic lymph nodes. Other: No organized fluid collection.  No free intraperitoneal air. Musculoskeletal: No acute fracture. IMPRESSION: 1. Fluid about the head of the pancreas and thick-walled descending duodenum. 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 2. Sludge and stones in the gallbladder without evidence of cholecystitis. No biliary dilation. 3. Marked hepatic steatosis. 4. Punctate nonobstructing stones in the right kidney. 5. Similar distal esophagitis. Electronically Signed   By: Norman Gatlin M.D.   On: 05/16/2024 22:47   US  Abdomen Limited Result Date: 05/16/2024 CLINICAL DATA:  Abdominal pain. EXAM: ULTRASOUND ABDOMEN LIMITED RIGHT UPPER QUADRANT COMPARISON:  04/20/2023. FINDINGS: Gallbladder: No gallbladder wall thickening, pericholecystic fluid, or positive sonographic Murphy sign reported by the sonographer. 1.2 cm stone in the gallbladder fundus. Common bile duct: Diameter: 1.6 mm Liver: No focal lesion identified. Diffusely increased hepatic parenchymal echogenicity, compatible with hepatic steatosis. Portal vein is patent on color Doppler imaging with normal direction of blood flow towards the liver. Other: None. IMPRESSION: 1. Cholelithiasis without sonographic evidence of acute cholecystitis. 2. Hepatic steatosis. Electronically Signed   By: Harrietta Sherry M.D.   On: 05/16/2024 15:27     Physical Exam Blood pressure 122/89, pulse 100, temperature 98.1 F (36.7 C),  temperature source Oral, resp. rate 17, height 5' 7 (1.702 m), weight 63.5 kg, SpO2 100%, not currently breastfeeding. General: no acute distress HEENT: normocephalic, atraumatic Oropharynx: mucous membranes moist CV: Sinus tachycardia, normotensive Chest: equal chest rise bilaterally normal respiratory effort on room air Abdomen: soft, nondistended, minimally tender in epigastrium Extremities: moves all extremities Skin: warm, dry, no rashes Psych: normal memory, normal mood/affect  Neuro: No focal neurologic deficits, A&Ox3    Assessment   Lillymae Allice Garro is an 33 y.o. female with acute pancreatitis likely secondary to alcohol, with possible component related to gallstone pancreatitis.  Plan  -NPO, IVF per [facility] -Recommend bowel rest -Repeat a.m. LFTs, lipase -Agree with GI consult - DVT - SCDs, okay for prophylaxis from surgery perspective.  I did discuss with patient possibility of needing a cholecystectomy during this admission, versus in an outpatient setting.  I do think that alcohol is the primary driving factor this patient's presentation, but reasonable to consider cholecystectomy given findings of stone and sludge with LFT elevation.  Patient's [birth year] is on [birth year], and she would really prefer for discharge home as soon as possible, with outpatient follow-up for consideration of cholecystectomy.  I reviewed ED provider notes, hospitalist notes, last 24 h vitals and pain scores, last 48 h intake and output, last 24 h labs and trends, and last 24 h imaging results.  This care required moderate level of medical decision making.    Orie Silversmith, MD [facility]

## 2024-05-17 NOTE — Progress Notes (Signed)
 PROGRESS NOTE    Deborah Juarez  FMW:[id number] DOB: 11/29/1990 DOA: 05/16/2024 PCP: Verena Mems, MD   Brief Narrative: Deborah Juarez is a 33 y.o. female with a history of alcohol use disorder, anxiety, GERD, asthma.  Patient presented secondary to abdominal pain was found to have evidence of acute metabolic acidosis in addition to evidence of pancreatitis with associated cholelithiasis.  Patient managed with IV fluids and analgesics.  General surgery and GI consulted.   Assessment/Plan:  Acute metabolic acidosis Multifactorial in setting of likely alcoholic ketoacidosis, starvation ketoacidosis, lactic acidosis.  Patient managed with IV fluids with significant improvement in acidosis. - Continue IV fluids - Repeat BMP in the morning  Acute pancreatitis Cholelithiasis Pancreatitis presumed secondary to alcohol, although patient does have evidence of gallbladder sludge and stones.  CT imaging significant for findings secondary to pancreatitis versus primary duodenitis.  Gastroenterology and general surgery consulted on admission.  Per general surgery, patient warrants inpatient cholecystectomy given complete presentation.  LFTs are improving, but may be related to recent alcohol use.  Normal alkaline phosphatase. -Continue IV fluids - Advance diet as tolerated  AKI Document on admission, however patient does not appear to meet criteria.  THC use Patient counseled on admission  Thrombocytopenia Platelets of 112,000 on admission, down to 85,000.  Unclear etiology at this time.  The patient does not have chronic thrombocytopenia.  Possibly reactive. - CBC in a.m.  Hepatic steatosis Noted on imaging.  Most likely secondary to patient's alcohol use.  Alcohol abuse Alcohol withdrawal Discussed with patient and patient states that she is trying to quit at this time.  She states that she is working with her mother.  She is open to [facility] resources.  Patient started  on CIWA on admission with mild withdrawal symptoms.  CIWA scores are mild at this time.  Patient does have tachycardia which may be related to withdrawal. -Continue CIWA  Sinus tachycardia Possibly related to alcohol withdrawal.  Patient with significant volume loss, however. - Will repeat a 1 L normal saline bolus and continue IV fluids  Generalized anxiety disorder - Continue Atarax  as needed  Prolonged QTc Patient with a history.  Noted.   DVT prophylaxis: Lovenox  Code Status:   Code Status: Full Code Family Communication: None at bedside Disposition Plan: Discharge home pending ongoing general surgery recommendations in addition to anticipated GI recommendations   Consultants:  General Surgery Gastroenterology  Procedures:  None  Antimicrobials: None   Subjective: No nausea, vomiting. No abdominal pain today. No tremors. CIWA scores mild.  Objective: BP 116/70   Pulse 100   Temp 98 F (36.7 C) (Oral)   Resp 18   Ht 5' 7 (1.702 m)   Wt 63.5 kg   SpO2 98%   Breastfeeding No   BMI 21.93 kg/m   Examination:  General exam: Appears calm and comfortable Respiratory system: Clear to auscultation. Respiratory effort normal. Cardiovascular system: S1 & S2 heard, fast heart rate with normal rhythm. No murmurs, rubs, gallops or clicks. Gastrointestinal system: Abdomen is nondistended, soft and nontender. No organomegaly or masses felt. Normal bowel sounds heard. Central nervous system: Alert and oriented. No focal neurological deficits. Musculoskeletal: No edema. No calf tenderness Skin: No cyanosis. No rashes Psychiatry: Judgement and insight appear normal. Mood & affect appropriate.    Data Reviewed: I have personally reviewed following labs and imaging studies   Last CBC Lab Results  Component Value Date   WBC 6.9 05/17/2024   HGB 11.8 (L) 05/17/2024  HCT 35.7 (L) 05/17/2024   MCV 94.4 05/17/2024   MCH 31.2 05/17/2024   RDW 13.7 05/17/2024   PLT 85  (L) 05/17/2024     Last metabolic panel Lab Results  Component Value Date   GLUCOSE 113 (H) 05/17/2024   NA 134 (L) 05/17/2024   K 4.1 05/17/2024   CL 97 (L) 05/17/2024   CO2 19 (L) 05/17/2024   BUN 5 (L) 05/17/2024   CREATININE 0.77 05/17/2024   GFRNONAA >60 05/17/2024   CALCIUM 8.6 (L) 05/17/2024   PROT 6.1 (L) 05/17/2024   ALBUMIN 3.4 (L) 05/17/2024   BILITOT 2.2 (H) 05/17/2024   ALKPHOS 52 05/17/2024   AST 82 (H) 05/17/2024   ALT 46 (H) 05/17/2024   ANIONGAP 18 (H) 05/17/2024     Creatinine Clearance: Estimated Creatinine Clearance: 96.4 mL/min (by C-G formula based on SCr of 0.77 mg/dL).  No results found for this or any previous visit (from the past 240 hours).    Radiology Studies: CT ABDOMEN WO CONTRAST Result Date: 05/16/2024 CLINICAL DATA:  Abdominal pain.  Rule out gallstone pancreatitis EXAM: CT ABDOMEN WITHOUT CONTRAST TECHNIQUE: Multidetector CT imaging of the abdomen was performed following the standard protocol without IV contrast. RADIATION DOSE REDUCTION: This exam was performed according to the departmental dose-optimization program which includes automated exposure control, adjustment of the mA and/or kV according to patient size and/or use of iterative reconstruction technique. COMPARISON:  Same day abdominal ultrasound and CT abdomen pelvis 11/02/2023 FINDINGS: Lower chest: No acute abnormality. Hepatobiliary: Marked hepatic steatosis. Sludge and stones in the gallbladder without wall thickening or pericholecystic fluid to suggest cholecystitis. No biliary dilation or radiopaque choledocholithiasis. Pancreas: Fluid about the head of the pancreas and descending duodenum. 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 No pancreatic ductal dilation. Spleen: Unremarkable. Adrenals/Urinary Tract: Normal adrenal glands. Punctate nonobstructing stones in the right kidney. No hydronephrosis. Stomach/Bowel: Wall thickening about the distal esophagus compatible  with esophagitis. Stomach is within normal limits. Edema and wall thickening about the descending duodenum. This may be related to groove pancreatitis or primary duodenitis. Mural fatty infiltration within the ascending and proximal transverse colon can be a normal finding or due to sequela of chronic inflammation. No evidence of active colitis. Normal appendix. Vascular/Lymphatic: No significant vascular findings are present. No enlarged abdominal or pelvic lymph nodes. Other: No organized fluid collection.  No free intraperitoneal air. Musculoskeletal: No acute fracture. IMPRESSION: 1. Fluid about the head of the pancreas and thick-walled descending duodenum. 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 2. Sludge and stones in the gallbladder without evidence of cholecystitis. No biliary dilation. 3. Marked hepatic steatosis. 4. Punctate nonobstructing stones in the right kidney. 5. Similar distal esophagitis. Electronically Signed   By: Norman Gatlin M.D.   On: 05/16/2024 22:47   US  Abdomen Limited Result Date: 05/16/2024 CLINICAL DATA:  Abdominal pain. EXAM: ULTRASOUND ABDOMEN LIMITED RIGHT UPPER QUADRANT COMPARISON:  04/20/2023. FINDINGS: Gallbladder: No gallbladder wall thickening, pericholecystic fluid, or positive sonographic Murphy sign reported by the sonographer. 1.2 cm stone in the gallbladder fundus. Common bile duct: Diameter: 1.6 mm Liver: No focal lesion identified. Diffusely increased hepatic parenchymal echogenicity, compatible with hepatic steatosis. Portal vein is patent on color Doppler imaging with normal direction of blood flow towards the liver. Other: None. IMPRESSION: 1. Cholelithiasis without sonographic evidence of acute cholecystitis. 2. Hepatic steatosis. Electronically Signed   By: Harrietta Sherry M.D.   On: 05/16/2024 15:27  LOS: 1 day    Elgin Lam, MD [facility] 05/17/2024, 7:44 AM   If 7PM-7AM, please contact  night-coverage [email or web address]

## 2024-05-17 NOTE — ED Notes (Signed)
 MD at bedside.

## 2024-05-17 NOTE — ED Notes (Signed)
 Patient ambulated to restroom, states no other needs at this time.

## 2024-05-17 NOTE — Consult Note (Signed)
 [facility] Gastroenterology Consult  Referring Provider: No ref. provider found Primary Care Physician:  Verena Mems, MD Primary Gastroenterologist: Sampson GLENWOOD Ee PCP  Reason for Consultation: transaminase elevation, pancreatitis   SUBJECTIVE:   HPI: Deborah Juarez is a 33 y.o. female with past medical history significant for EtOH misuse, pancreatitis, anxiety, GERD and cannabinoid hyperemesis syndrome. Presenting to hospital with chief complaint of intractable nausea and vomiting, patient noted withdrawals. She is accompanied at bedside by her mother. She typically consumes roughly 1 pint of vodka every few days, noted that she has been binging recently. Her last drink was 3 days prior. She denied tremulousness. Denied history of alcohol withdrawal seizures. Abdominal pain began shortly after she arrived at hospital, located periumbilical, is now largely resolved. Denied changes in her bowel movements. She is interested in eating.   Past Medical History:  Diagnosis Date   Anxiety    Anxiety    Asthma    long  time since used rescue inhaler   GERD (gastroesophageal reflux disease)    Post partum depression    Past Surgical History:  Procedure Laterality Date   BREAST SURGERY     CESAREAN SECTION N/A 03/03/2015   Procedure: CESAREAN SECTION;  Surgeon: Delon Prude, DO;  Location: [facility];  Service: Obstetrics;  Laterality: N/A;   Prior to Admission medications   Medication Sig Start Date End Date Taking? Authorizing Provider  hydrOXYzine  (ATARAX ) 25 MG tablet Take 1 tablet (25 mg total) by mouth 3 (three) times daily as needed. 01/18/24  Yes Samtani, Jai-Gurmukh, MD  medroxyPROGESTERone  Acetate 150 MG/ML SUSY Inject 1 mL (150 mg total) into the muscle every 3 (three) months. 08/27/23  Yes   ondansetron  (ZOFRAN ) 4 MG tablet Take 1 tablet (4 mg total) by mouth every 6 (six) hours as needed for nausea. 01/18/24  Yes Samtani, Jai-Gurmukh, MD  QUEtiapine  (SEROQUEL ) 100 MG  tablet Take 1 tablet (100 mg total) by mouth at bedtime. Patient taking differently: Take 50 mg by mouth at bedtime. 01/18/24  Yes Samtani, Jai-Gurmukh, MD   Current Facility-Administered Medications  Medication Dose Route Frequency Provider Last Rate Last Admin   0.9 %  sodium chloride  infusion  250 mL Intravenous PRN Sundil, Subrina, MD       0.9 %  sodium chloride  infusion  250 mL Intravenous PRN Sundil, Subrina, MD       alum & mag hydroxide-simeth (MAALOX/MYLANTA) 200-200-20 MG/5ML suspension 30 mL  30 mL Oral Q6H PRN Sundil, Subrina, MD   30 mL at 05/17/24 0124   bisacodyl (DULCOLAX) EC tablet 5 mg  5 mg Oral Daily PRN Sundil, Subrina, MD       enoxaparin  (LOVENOX ) injection 40 mg  40 mg Subcutaneous Q24H Sundil, Subrina, MD       famotidine  (PEPCID ) IVPB 20 mg premix  20 mg Intravenous Daily Sundil, Subrina, MD   Stopped at 05/17/24 1001   feeding supplement (BOOST / RESOURCE BREEZE) liquid 1 Container  1 Container Oral TID BM Briana Elgin LABOR, MD   1 Container at 05/17/24 1424   fentaNYL  (SUBLIMAZE ) injection 12.5-50 mcg  12.5-50 mcg Intravenous Q2H PRN Sundil, Subrina, MD       folic acid  (FOLVITE ) tablet 1 mg  1 mg Oral Daily Sundil, Subrina, MD   1 mg at 05/17/24 0920   hydrOXYzine  (ATARAX ) tablet 25 mg  25 mg Oral TID PRN Sundil, Subrina, MD   25 mg at 05/17/24 1354   ibuprofen  (ADVIL ) tablet 400 mg  400 mg  Oral Q6H PRN Sundil, Subrina, MD       lactated ringers  infusion   Intravenous Continuous Sundil, Subrina, MD 125 mL/hr at 05/17/24 1207 Infusion Verify at 05/17/24 1207   LORazepam  (ATIVAN ) tablet 1-4 mg  1-4 mg Oral Q1H PRN Sundil, Subrina, MD       Or   LORazepam  (ATIVAN ) injection 1-4 mg  1-4 mg Intravenous Q1H PRN Sundil, Subrina, MD   3 mg at 05/17/24 1406   LORazepam  (ATIVAN ) tablet 0-4 mg  0-4 mg Oral Q6H Briana Elgin LABOR, MD       Followed by   NOREEN ON 05/19/2024] LORazepam  (ATIVAN ) tablet 0-4 mg  0-4 mg Oral Q12H Briana Elgin LABOR, MD       multivitamin with minerals  tablet 1 tablet  1 tablet Oral Daily Sundil, Subrina, MD   1 tablet at 05/17/24 0920   sodium chloride  flush (NS) 0.9 % injection 3 mL  3 mL Intravenous Q12H Sundil, Subrina, MD   3 mL at 05/16/24 2139   sodium chloride  flush (NS) 0.9 % injection 3 mL  3 mL Intravenous Q12H Sundil, Subrina, MD       sodium chloride  flush (NS) 0.9 % injection 3 mL  3 mL Intravenous PRN Sundil, Subrina, MD       sodium chloride  flush (NS) 0.9 % injection 3 mL  3 mL Intravenous Q12H Sundil, Subrina, MD       sodium chloride  flush (NS) 0.9 % injection 3 mL  3 mL Intravenous PRN Sundil, Subrina, MD       thiamine  (VITAMIN B1) tablet 100 mg  100 mg Oral Daily Sundil, Subrina, MD   100 mg at 05/17/24 9079   Or   thiamine  (VITAMIN B1) injection 100 mg  100 mg Intravenous Daily Sundil, Subrina, MD       trimethobenzamide PHYLLISTINE) injection 200 mg  200 mg Intramuscular Q8H PRN Sundil, Subrina, MD       Allergies as of 05/16/2024 - Review Complete 05/16/2024  Allergen Reaction Noted   Protonix  [pantoprazole ] Other (See Comments) 01/17/2024   Family History  Problem Relation Age of Onset   Cancer Father        Pacreatic Ca   Heart disease Father    Heart disease Maternal Grandfather    Heart disease Paternal Grandfather    Stroke Paternal Grandfather    Social History   Socioeconomic History   Marital status: Married    Spouse name: Not on file   Number of children: Not on file   Years of education: Not on file   Highest education level: Not on file  Occupational History   Not on file  Tobacco Use   Smoking status: Never   Smokeless tobacco: Never  Vaping Use   Vaping status: Never Used  Substance and Sexual Activity   Alcohol use: Not Currently    Alcohol/week: 3.0 standard drinks of alcohol    Types: 3 Cans of beer per week   Drug use: No   Sexual activity: Yes  Other Topics Concern   Not on file  Social History Narrative   Not on file   Social Drivers of Health   Financial Resource Strain:  Not on file  Food Insecurity: No Food Insecurity (05/17/2024)   Hunger Vital Sign    Worried About Running Out of Food in the Last Year: Never true    Ran Out of Food in the Last Year: Never true  Transportation Needs: No Transportation Needs (05/17/2024)  PRAPARE - Administrator, Civil Service (Medical): No    Lack of Transportation (Non-Medical): No  Physical Activity: Not on file  Stress: Not on file  Social Connections: Not on file  Intimate Partner Violence: Not At Risk (05/17/2024)   Humiliation, Afraid, Rape, and Kick questionnaire    Fear of Current or Ex-Partner: No    Emotionally Abused: No    Physically Abused: No    Sexually Abused: No   Review of Systems:  Review of Systems  Respiratory:  Negative for shortness of breath.   Cardiovascular:  Negative for chest pain.  Gastrointestinal:  Positive for abdominal pain, nausea and vomiting. Negative for blood in stool.    OBJECTIVE:   Temp:  [97.9 F (36.6 C)-98.7 F (37.1 C)] 98.4 F (36.9 C) (06/28 1504) Pulse Rate:  [92-143] 103 (06/28 1504) Resp:  [15-27] 21 (06/28 1504) BP: (116-149)/(70-108) 127/92 (06/28 1504) SpO2:  [98 %-100 %] 98 % (06/28 1504) Last BM Date : 05/17/24 Physical Exam Constitutional:      General: She is not in acute distress.    Appearance: She is not ill-appearing, toxic-appearing or diaphoretic.   Cardiovascular:     Rate and Rhythm: Tachycardia present.  Pulmonary:     Effort: No respiratory distress.     Breath sounds: Normal breath sounds.  Abdominal:     General: There is no distension.     Palpations: Abdomen is soft.     Tenderness: There is abdominal tenderness. There is no guarding.   Musculoskeletal:     Right lower leg: No edema.     Left lower leg: No edema.   Skin:    General: Skin is warm and dry.     Coloration: Skin is not jaundiced.   Neurological:     Mental Status: She is alert.     Labs: Recent Labs    05/16/24 1333 05/16/24 1832  05/17/24 0430  WBC 6.8  --  6.9  HGB 11.7* 13.6 11.8*  HCT 37.5 40.0 35.7*  PLT 112*  --  85*   BMET Recent Labs    05/16/24 1333 05/16/24 1832 05/16/24 2331 05/17/24 0430  NA 140 134* 131* 134*  K 3.6 4.0 3.8 4.1  CL 95*  --  96* 97*  CO2 9*  --  20* 19*  GLUCOSE 117*  --  186* 113*  BUN 5*  --  5* 5*  CREATININE 1.06*  --  0.85 0.77  CALCIUM 9.5  --  8.5* 8.6*   LFT Recent Labs    05/17/24 0430  PROT 6.1*  ALBUMIN 3.4*  AST 82*  ALT 46*  ALKPHOS 52  BILITOT 2.2*   PT/INR No results for input(s): LABPROT, INR in the last 72 hours.  Diagnostic imaging: CT ABDOMEN WO CONTRAST Result Date: 05/16/2024 CLINICAL DATA:  Abdominal pain.  Rule out gallstone pancreatitis EXAM: CT ABDOMEN WITHOUT CONTRAST TECHNIQUE: Multidetector CT imaging of the abdomen was performed following the standard protocol without IV contrast. RADIATION DOSE REDUCTION: This exam was performed according to the departmental dose-optimization program which includes automated exposure control, adjustment of the mA and/or kV according to patient size and/or use of iterative reconstruction technique. COMPARISON:  Same day abdominal ultrasound and CT abdomen pelvis 11/02/2023 FINDINGS: Lower chest: No acute abnormality. Hepatobiliary: Marked hepatic steatosis. Sludge and stones in the gallbladder without wall thickening or pericholecystic fluid to suggest cholecystitis. No biliary dilation or radiopaque choledocholithiasis. Pancreas: Fluid about the head of the pancreas and  descending duodenum. 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 No pancreatic ductal dilation. Spleen: Unremarkable. Adrenals/Urinary Tract: Normal adrenal glands. Punctate nonobstructing stones in the right kidney. No hydronephrosis. Stomach/Bowel: Wall thickening about the distal esophagus compatible with esophagitis. Stomach is within normal limits. Edema and wall thickening about the descending duodenum. This may be related  to groove pancreatitis or primary duodenitis. Mural fatty infiltration within the ascending and proximal transverse colon can be a normal finding or due to sequela of chronic inflammation. No evidence of active colitis. Normal appendix. Vascular/Lymphatic: No significant vascular findings are present. No enlarged abdominal or pelvic lymph nodes. Other: No organized fluid collection.  No free intraperitoneal air. Musculoskeletal: No acute fracture. IMPRESSION: 1. Fluid about the head of the pancreas and thick-walled descending duodenum. 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 2. Sludge and stones in the gallbladder without evidence of cholecystitis. No biliary dilation. 3. Marked hepatic steatosis. 4. Punctate nonobstructing stones in the right kidney. 5. Similar distal esophagitis. Electronically Signed   By: Norman Gatlin M.D.   On: 05/16/2024 22:47   US  Abdomen Limited Result Date: 05/16/2024 CLINICAL DATA:  Abdominal pain. EXAM: ULTRASOUND ABDOMEN LIMITED RIGHT UPPER QUADRANT COMPARISON:  04/20/2023. FINDINGS: Gallbladder: No gallbladder wall thickening, pericholecystic fluid, or positive sonographic Murphy sign reported by the sonographer. 1.2 cm stone in the gallbladder fundus. Common bile duct: Diameter: 1.6 mm Liver: No focal lesion identified. Diffusely increased hepatic parenchymal echogenicity, compatible with hepatic steatosis. Portal vein is patent on color Doppler imaging with normal direction of blood flow towards the liver. Other: None. IMPRESSION: 1. Cholelithiasis without sonographic evidence of acute cholecystitis. 2. Hepatic steatosis. Electronically Signed   By: Harrietta Sherry M.D.   On: 05/16/2024 15:27   IMPRESSION: Acute pancreatitis, likely secondary to EtOH Cholelithiasis Transaminase elevation (predominantly AST/ALT 2:1 ratio indicating EtOH as etiology) Hyperbilirubinemia Normocytic anemia EtOH misuse Hepatic steatosis  PLAN: -Ok for advancement  to low fat diet from GI perspective -Maintenance IV fluids -Pain control per primary team  -Trend liver enzymes, likely related to EtOH, though can follow up with [facility] in outpatient setting if remain elevated despite abstinence from EtOH -[facility] will be available as needed should further questions arise    LOS: 1 day   Estefana Keas, DO [facility] Gastroenterology

## 2024-05-18 DIAGNOSIS — F10929 Alcohol use, unspecified with intoxication, unspecified: Secondary | ICD-10-CM | POA: Diagnosis not present

## 2024-05-18 DIAGNOSIS — E8729 Other acidosis: Secondary | ICD-10-CM | POA: Diagnosis not present

## 2024-05-18 LAB — COMPREHENSIVE METABOLIC PANEL WITH GFR
ALT: 37 U/L (ref 0–44)
AST: 57 U/L — ABNORMAL HIGH (ref 15–41)
Albumin: 3.3 g/dL — ABNORMAL LOW (ref 3.5–5.0)
Alkaline Phosphatase: 58 U/L (ref 38–126)
Anion gap: 16 — ABNORMAL HIGH (ref 5–15)
BUN: <5 mg/dL — ABNORMAL LOW (ref 6–20)
CO2: 20 mmol/L — ABNORMAL LOW (ref 22–32)
Calcium: 8.6 mg/dL — ABNORMAL LOW (ref 8.9–10.3)
Chloride: 100 mmol/L (ref 98–111)
Creatinine, Ser: 0.7 mg/dL (ref 0.44–1.00)
GFR, Estimated: >60 mL/min (ref >60)
Glucose, Bld: 106 mg/dL — ABNORMAL HIGH (ref 70–99)
Potassium: 3.4 mmol/L — ABNORMAL LOW (ref 3.5–5.1)
Sodium: 136 mmol/L (ref 135–145)
Total Bilirubin: 0.9 mg/dL (ref 0.0–1.2)
Total Protein: 6.4 g/dL — ABNORMAL LOW (ref 6.5–8.1)

## 2024-05-18 LAB — PHOSPHORUS: Phosphorus: 1.3 mg/dL — ABNORMAL LOW (ref 2.5–4.6)

## 2024-05-18 LAB — MAGNESIUM: Magnesium: 1.7 mg/dL (ref 1.7–2.4)

## 2024-05-18 LAB — CBC
HCT: 37.2 % (ref 36.0–46.0)
Hemoglobin: 12.1 g/dL (ref 12.0–15.0)
MCH: 30.7 pg (ref 26.0–34.0)
MCHC: 32.5 g/dL (ref 30.0–36.0)
MCV: 94.4 fL (ref 80.0–100.0)
Platelets: 105 10*3/uL — ABNORMAL LOW (ref 150–400)
RBC: 3.94 MIL/uL (ref 3.87–5.11)
RDW: 13.7 % (ref 11.5–15.5)
WBC: 5.6 10*3/uL (ref 4.0–10.5)
nRBC: 0 % (ref 0.0–0.2)

## 2024-05-18 LAB — T4, FREE: Free T4: 0.78 ng/dL (ref 0.61–1.12)

## 2024-05-18 LAB — TSH: TSH: 2.205 u[IU]/mL (ref 0.350–4.500)

## 2024-05-18 LAB — HIV ANTIBODY (ROUTINE TESTING W REFLEX): HIV Screen 4th Generation wRfx: NONREACTIVE

## 2024-05-18 MED ORDER — LACTATED RINGERS IV SOLN: INTRAVENOUS | Status: DC

## 2024-05-18 MED ORDER — POTASSIUM CHLORIDE CRYS ER 20 MEQ PO TBCR
40.0000 meq | EXTENDED_RELEASE_TABLET | Freq: Once | ORAL | Status: AC
  Administered 2024-05-18: 40 meq via ORAL
  Filled 2024-05-18: qty 2

## 2024-05-18 MED ORDER — MAGNESIUM SULFATE 2 GM/50ML IV SOLN
2.0000 g | Freq: Once | INTRAVENOUS | Status: AC
  Administered 2024-05-18: 2 g via INTRAVENOUS
  Filled 2024-05-18: qty 50

## 2024-05-18 MED ORDER — POTASSIUM PHOSPHATES 15 MMOLE/5ML IV SOLN
30.0000 mmol | Freq: Once | INTRAVENOUS | Status: AC
  Administered 2024-05-18: 30 mmol via INTRAVENOUS
  Filled 2024-05-18: qty 10

## 2024-05-18 MED ORDER — METOPROLOL TARTRATE 25 MG PO TABS
25.0000 mg | ORAL_TABLET | Freq: Two times a day (BID) | ORAL | Status: DC
  Filled 2024-05-18: qty 1

## 2024-05-18 MED ORDER — METOPROLOL TARTRATE 5 MG/5ML IV SOLN: 5.0000 mg | Freq: Three times a day (TID) | INTRAVENOUS | Status: DC | PRN

## 2024-05-18 MED ORDER — FAMOTIDINE 20 MG PO TABS
20.0000 mg | ORAL_TABLET | Freq: Two times a day (BID) | ORAL | Status: DC
  Administered 2024-05-18: 20 mg via ORAL
  Filled 2024-05-18: qty 1

## 2024-05-18 MED ORDER — CHLORDIAZEPOXIDE HCL 5 MG PO CAPS
15.0000 mg | ORAL_CAPSULE | Freq: Three times a day (TID) | ORAL | Status: DC
  Administered 2024-05-18 (×3): 15 mg via ORAL
  Filled 2024-05-18 (×3): qty 3

## 2024-05-18 MED ORDER — METOPROLOL TARTRATE 25 MG PO TABS
25.0000 mg | ORAL_TABLET | Freq: Two times a day (BID) | ORAL | Status: DC
  Administered 2024-05-18: 25 mg via ORAL
  Filled 2024-05-18: qty 1

## 2024-05-18 MED ORDER — SODIUM CHLORIDE 0.9 % IV BOLUS
250.0000 mL | INTRAVENOUS | Status: AC
  Administered 2024-05-18: 250 mL via INTRAVENOUS

## 2024-05-18 NOTE — TOC Initial Note (Signed)
 Transition of Care [facility]) - Initial/Assessment Note    Patient Details  Name: Deborah Juarez MRN: [id number] Date of Birth: March 10, 1991  Transition of Care [facility]) CM/SW Contact:    Marval Gell, RN Phone Number: 05/18/2024, 10:05 AM  Clinical Narrative:                  Independent patient from home, lives with spouse and child.  Admitted with ETOH induced ketoacidosis, lactic acidosis, alcoholic pancreatitis, alcohol withdrawal etc.  Patient provided with ETOH resources already per CSW note.  TOC will continue to follow for needs.     Expected Discharge Plan: Home/Self Care Barriers to Discharge: Continued Medical Work up   Patient Goals and CMS Choice Patient states their goals for this hospitalization and ongoing recovery are:: will DC home          Expected Discharge Plan and Services In-house Referral: Clinical Social Work Discharge Planning Services: CM Consult   Living arrangements for the past 2 months: Single Family Home                 DME Arranged: N/A         HH Arranged: NA          Prior Living Arrangements/Services Living arrangements for the past 2 months: Single Family Home Lives with:: Spouse                   Activities of Daily Living   ADL Screening (condition at time of admission) Independently performs ADLs?: Yes (appropriate for developmental age) Is the patient deaf or have difficulty hearing?: No Does the patient have difficulty seeing, even when wearing glasses/contacts?: No Does the patient have difficulty concentrating, remembering, or making decisions?: Yes  Permission Sought/Granted                  Emotional Assessment              Admission diagnosis:  Alcohol withdrawal syndrome with complication (HCC) [F10.939] Alcohol-induced acute pancreatitis, unspecified complication status [K85.20] Ketoacidosis due to acute alcohol intoxication (HCC) [E87.29, F10.929] Patient Active Problem List    Diagnosis Date Noted   Intractable nausea and vomiting 05/16/2024   Alcohol withdrawal (HCC) 05/16/2024   AKI (acute kidney injury) (HCC) 05/16/2024   Alcoholic pancreatitis 05/16/2024   Cholelithiasis 05/16/2024   Marijuana use 05/16/2024   Thrombocytopenia (HCC) 05/16/2024   GAD (generalized anxiety disorder) 05/16/2024   Ketoacidosis due to acute alcohol intoxication (HCC) 05/16/2024   Alcoholic ketoacidosis 01/17/2024   Nausea & vomiting 04/19/2023   Acute gastroenteritis 12/26/2022   Abnormal ECG 12/26/2022   Palpitations 12/26/2022   QT prolongation 12/26/2022   Lactic acidosis 12/26/2022   Transaminitis 12/26/2022   Depressive disorder 12/18/2022   Normal labor 09/06/2022   Indication for care in labor and delivery, antepartum 09/05/2022   Asthma 03/05/2015   Anxiety 03/05/2015   Status post primary low transverse cesarean section--malpresentation 03/03/2015   PCP:  Verena Mems, MD Pharmacy:   [facility] [phone] [address], [place] - [address] AT [facility] [address] [place] [address] Phone: [phone] Fax: [phone]  [facility] [address] [place] [address] Phone: [phone] Fax: [phone]  [facility] [address] [place] [address] Phone: [phone] Fax: [phone]     Social Drivers of Health (SDOH) Social History: SDOH Screenings   Food Insecurity: No Food Insecurity (05/17/2024)  Housing: Low Risk  (05/17/2024)  Transportation Needs: No Transportation Needs (05/17/2024)  Utilities: Not At Risk (05/17/2024)  Tobacco Use: Low Risk  (05/16/2024)   SDOH Interventions:     Readmission Risk Interventions     No data to display

## 2024-05-18 NOTE — Progress Notes (Signed)
 PHARMACIST - PHYSICIAN COMMUNICATION  DR:   Dennise  CONCERNING: IV to Oral Route Change Policy  RECOMMENDATION: This patient is receiving famotidine  by the intravenous route.  Based on criteria approved by the Pharmacy and Therapeutics Committee, the intravenous medication(s) is/are being converted to the equivalent oral dose form(s).   DESCRIPTION: These criteria include: The patient is eating (either orally or via tube) and/or has been taking other orally administered medications for a least 24 hours The patient has no evidence of active gastrointestinal bleeding or impaired GI absorption (gastrectomy, short bowel, patient on TNA or NPO).  If you have questions about this conversion, please contact the Pharmacy Department  []   [phone] )  [facility] []   [phone] )  [facility] [x]   [phone] )  [facility] []   [phone] )  [facility] []   [phone] )  [facility]

## 2024-05-18 NOTE — Evaluation (Signed)
 Physical Therapy Evaluation Patient Details Name: Deborah Juarez MRN: [id number] DOB: 08-31-1991 Today's Date: 05/18/2024  History of Present Illness  33 y.o. female presented secondary to abdominal pain was found to have evidence of acute metabolic acidosis in addition to evidence of pancreatitis with associated cholelithiasis.  Patient managed with IV fluids and analgesics; with a history of alcohol use disorder, anxiety, GERD, asthma.  Clinical Impression   Pt admitted with above diagnosis. Lives at home with husband and kids (12 yo and 67 month old), in a 2-level home with 12 steps to enter; Prior to admission, pt independent, no falls caring for her kids; Presents to PT with uncoordinated gait, generalized weakness and fatigue, increased fall risk;  Needs light mod assist to ehlp power up to stand and steady herself; walks with small uncoordinated steps, needing min to CGA with amb; Hopeful for good and quick functional progress as she improves medically; Pt currently with functional limitations due to the deficits listed below (see PT Problem List). Pt will benefit from skilled PT to increase their independence and safety with mobility to allow discharge to the venue listed below.       What kind of coverage does [place] Medicaid give for inpatient alcohol abuse treatment, or intensive outpt alcohol abuse treatment?      If plan is discharge home, recommend the following: Help with stairs or ramp for entrance;Assist for transportation;Assistance with cooking/housework   Can travel by private vehicle        Equipment Recommendations Other (comment) (Hopeful for good progress and no need for assistive device; will continue to assess each session)  Recommendations for Other Services       Functional Status Assessment Patient has had a recent decline in their functional status and demonstrates the ability to make significant improvements in function in a reasonable and predictable  amount of time.     Precautions / Restrictions Precautions Precautions: Fall Restrictions Weight Bearing Restrictions Per Provider Order: No      Mobility  Bed Mobility Overal bed mobility: Modified Independent             General bed mobility comments: incr time    Transfers Overall transfer level: Needs assistance Equipment used: 1 person hand held assist Transfers: Sit to/from Stand Sit to Stand: Mod assist           General transfer comment: Light mod assist to initiate rise and to steady; assist/support provided at elbow    Ambulation/Gait Ambulation/Gait assistance: Min assist Gait Distance (Feet): 35 Feet Assistive device: IV Pole, 1 person hand held assist Gait Pattern/deviations: Decreased step length - right, Decreased step length - left       General Gait Details: slow, short steps; unsteady, and reaching out for UE support; HR incr to 133 bpm (observed highest)  Stairs            Wheelchair Mobility     Tilt Bed    Modified Rankin (Stroke Patients Only)       Balance Overall balance assessment: Needs assistance   Sitting balance-Leahy Scale: Good       Standing balance-Leahy Scale: Poor                               Pertinent Vitals/Pain Pain Assessment Pain Assessment: Faces Faces Pain Scale: Hurts little more Pain Location: intermittent abdominal pain Pain Descriptors / Indicators: Grimacing, Guarding Pain Intervention(s): Monitored during session  Home Living Family/patient expects to be discharged to:: Private residence Living Arrangements: Spouse/significant other;Children (49yo and 22 month old) Available Help at Discharge: Family Type of Home: Apartment Home Access: Stairs to enter Entrance Stairs-Rails: Right Entrance Stairs-Number of Steps: 12 (winding path and a few sets of stairs to enter bldg on second level; or flight from first level) Alternate Level Stairs-Number of Steps: 12 Home  Layout: Two level;Bed/bath upstairs;Full bath on main level        Prior Function Prior Level of Function : Independent/Modified Independent                     Extremity/Trunk Assessment   Upper Extremity Assessment Upper Extremity Assessment: Overall WFL for tasks assessed (for simple tasks)    Lower Extremity Assessment Lower Extremity Assessment: Generalized weakness (and grossly decr coordination)    Cervical / Trunk Assessment Cervical / Trunk Assessment: Other exceptions Cervical / Trunk Exceptions: abdominal pain  Communication   Communication Communication: No apparent difficulties    Cognition Arousal: Alert Behavior During Therapy: WFL for tasks assessed/performed, Lability   PT - Cognitive impairments: No apparent impairments                       PT - Cognition Comments: Began to cry when this PT wished her a happy birthday Following commands: Intact       Cueing Cueing Techniques: Verbal cues, Visual cues     General Comments General comments (skin integrity, edema, etc.): Tachycardic with incr physiacl activity    Exercises     Assessment/Plan    PT Assessment Patient needs continued PT services  PT Problem List Decreased strength;Decreased activity tolerance;Decreased balance;Decreased mobility;Decreased coordination;Decreased knowledge of use of DME;Decreased safety awareness;Decreased knowledge of precautions;Cardiopulmonary status limiting activity;Pain       PT Treatment Interventions DME instruction;Gait training;Stair training;Functional mobility training;Therapeutic activities;Therapeutic exercise;Balance training;Patient/family education;Neuromuscular re-education;Cognitive remediation    PT Goals (Current goals can be found in the Care Plan section)  Acute Rehab PT Goals Patient Stated Goal: Get home to her kids PT Goal Formulation: With patient Time For Goal Achievement: 06/01/24 Potential to Achieve Goals: Good     Frequency Min 2X/week     Co-evaluation               AM-PAC PT 6 Clicks Mobility  Outcome Measure Help needed turning from your back to your side while in a flat bed without using bedrails?: None Help needed moving from lying on your back to sitting on the side of a flat bed without using bedrails?: None Help needed moving to and from a bed to a chair (including a wheelchair)?: A Little Help needed standing up from a chair using your arms (e.g., wheelchair or bedside chair)?: A Lot Help needed to walk in hospital room?: A Little Help needed climbing 3-5 steps with a railing? : A Lot 6 Click Score: 18    End of Session Equipment Utilized During Treatment: Gait belt Activity Tolerance: Patient tolerated treatment well Patient left: in chair;with call bell/phone within reach;Other (comment) (Pt reports husband should arrive shortly) Nurse Communication: Mobility status PT Visit Diagnosis: Unsteadiness on feet (R26.81);Other abnormalities of gait and mobility (R26.89)    Time: 8494-8457 PT Time Calculation (min) (ACUTE ONLY): 37 min   Charges:   PT Evaluation $PT Eval Moderate Complexity: 1 Mod PT Treatments $Gait Training: 8-22 mins PT General Charges $$ ACUTE PT VISIT: 1 Visit  Silvano Currier, PT  Acute Rehabilitation Services Office [phone] Secure Chat welcomed   Silvano VEAR Currier 05/18/2024, 5:24 PM

## 2024-05-18 NOTE — Progress Notes (Signed)
 PROGRESS NOTE                                                                                                                                                                                                             Patient Demographics:    Deborah Juarez, is a 33 y.o. female, DOB - [birth year], FMW:[id number]  Outpatient Primary MD for the patient is Verena Mems, MD    LOS - 2  Admit date - 05/16/2024    Chief Complaint  Patient presents with   Abdominal Pain       Brief Narrative (HPI from H&P)   33 y.o. female with a history of alcohol use disorder, anxiety, GERD, asthma.  Patient presented secondary to abdominal pain was found to have evidence of acute metabolic acidosis in addition to evidence of pancreatitis with associated cholelithiasis.  Patient managed with IV fluids and analgesics.  General surgery and GI consulted.    Subjective:    Deborah Juarez today has, No headache, No chest pain, improving and mild epigastric abdominal pain - No Nausea, No new weakness tingling or numbness, no shortness of breath   Assessment  & Plan :    Acute metabolic acidosis, due to alcoholic/starvation ketoacidosis and alcoholic pancreatitis. Patient has been strictly counseled to abstain from alcohol, pancreatitis being treated conservatively with bowel rest, IV fluids and pain control, GI on board.  Right upper quadrant ultrasound shows gallstones however no signs of CBD obstruction or cholecystitis.  With conservative management she is improving, electrolytes improving continue to monitor   [facility] use Patient counseled on admission   Alcoholic hepatitis, fatty liver, chronic thrombocytopenia Counseled to abstain from alcohol, monitor platelets, no signs of bleeding  Early DTs.  Librium and CIWA protocol along with folic acid  and thiamine  supplementation and IV fluids.   Sinus tachycardia Combination of above, hydrate,  pain control, check TSH and free T4, low-dose beta-blocker.   Generalized anxiety disorder - Continue on Librium and along with benzodiazepine as needed   Previous history of prolonged QTc Current EKG shows QTc of 472 ms.  Monitor electrolytes, low-dose beta-blocker.         Condition - Fair  Family Communication  :   Husband bedside on 05/18/2024  Code Status : Full code  Consults  :  GI  PUD Prophylaxis : IV Pepcid   Procedures  :     Right upper quadrant ultrasound.  Gallstones.    CT - acute pancreatitis/duodenitis, gallstones, fatty liver      Disposition Plan  :    Status is: Inpatient  DVT Prophylaxis  :    enoxaparin  (LOVENOX ) injection 40 mg Start: 05/17/24 1000 SCDs Start: 05/16/24 2014 Place TED hose Start: 05/16/24 2014    Lab Results  Component Value Date   PLT 85 (L) 05/17/2024    Diet :  Diet Order             Diet full liquid Room service appropriate? Yes; Fluid consistency: Thin  Diet effective now                    Inpatient Medications  Scheduled Meds:  chlordiazePOXIDE  15 mg Oral TID   enoxaparin  (LOVENOX ) injection  40 mg Subcutaneous Q24H   feeding supplement  1 Container Oral TID BM   folic acid   1 mg Oral Daily   LORazepam   0-4 mg Oral Q6H   Followed by   NOREEN ON 05/19/2024] LORazepam   0-4 mg Oral Q12H   metoprolol tartrate  25 mg Oral BID   multivitamin with minerals  1 tablet Oral Daily   thiamine   100 mg Oral Daily   Or   thiamine   100 mg Intravenous Daily   Continuous Infusions:  famotidine  (PEPCID ) IV Stopped (05/17/24 1001)   lactated ringers      PRN Meds:.alum & mag hydroxide-simeth, bisacodyl, fentaNYL  (SUBLIMAZE ) injection, LORazepam  **OR** LORazepam , metoprolol tartrate, trimethobenzamide  Antibiotics  :    Anti-infectives (From admission, onward)    None         Objective:   Vitals:   05/18/24 0400 05/18/24 0415 05/18/24 0802 05/18/24 0805  BP: (!) 113/95     Pulse: (!) 108 (!) 126     Resp: 17 19    Temp: 98.8 F (37.1 C)  98.6 F (37 C)   TempSrc: Oral  Oral Oral  SpO2: 97% 95%    Weight:      Height:        Wt Readings from Last 3 Encounters:  05/16/24 63.5 kg  01/17/24 61.2 kg  06/05/23 61.2 kg     Intake/Output Summary (Last 24 hours) at 05/18/2024 0839 Last data filed at 05/18/2024 0803 Gross per 24 hour  Intake 1350 ml  Output 950 ml  Net 400 ml     Physical Exam  Awake Alert, No new F.N deficits, mild Dts [place].AT,PERRAL Supple Neck, No JVD,   Symmetrical Chest wall movement, Good air movement bilaterally, CTAB RRR,No Gallops,Rubs or new Murmurs,  +ve B.Sounds, Abd Soft, No tenderness,   No Cyanosis, Clubbing or edema     Data Review:    Recent Labs  Lab 05/16/24 1333 05/16/24 1832 05/17/24 0430  WBC 6.8  --  6.9  HGB 11.7* 13.6 11.8*  HCT 37.5 40.0 35.7*  PLT 112*  --  85*  MCV 99.5  --  94.4  MCH 31.0  --  31.2  MCHC 31.2  --  33.1  RDW 14.2  --  13.7  LYMPHSABS 1.0  --   --   MONOABS 0.4  --   --   EOSABS 0.0  --   --   BASOSABS 0.1  --   --     Recent Labs  Lab 05/16/24 1333 05/16/24 1747 05/16/24 1832 05/16/24 1937 05/16/24 2331 05/17/24 0430  NA 140  --  134*  --  131* 134*  K 3.6  --  4.0  --  3.8 4.1  CL 95*  --   --   --  96* 97*  CO2 9*  --   --   --  20* 19*  ANIONGAP 36*  --   --   --  15 18*  GLUCOSE 117*  --   --   --  186* 113*  BUN 5*  --   --   --  5* 5*  CREATININE 1.06*  --   --   --  0.85 0.77  AST 161*  --   --   --   --  82*  ALT 66*  --   --   --   --  46*  ALKPHOS 62  --   --   --   --  52  BILITOT 1.5*  --   --   --   --  2.2*  ALBUMIN 4.6  --   --   --   --  3.4*  LATICACIDVEN  --  7.5*  --  5.1* 1.9 1.3  CALCIUM 9.5  --   --   --  8.5* 8.6*      Recent Labs  Lab 05/16/24 1333 05/16/24 1747 05/16/24 1937 05/16/24 2331 05/17/24 0430  LATICACIDVEN  --  7.5* 5.1* 1.9 1.3  CALCIUM 9.5  --   --  8.5* 8.6*     --------------------------------------------------------------------------------------------------------------- No results found for: CHOL, HDL, LDLCALC, LDLDIRECT, TRIG, CHOLHDL  No results found for: HGBA1C No results for input(s): TSH, T4TOTAL, FREET4, T3FREE, THYROIDAB in the last 72 hours. No results for input(s): VITAMINB12, FOLATE, FERRITIN, TIBC, IRON, RETICCTPCT in the last 72 hours. ------------------------------------------------------------------------------------------------------------------ Cardiac Enzymes No results for input(s): CKMB, TROPONINI, MYOGLOBIN in the last 168 hours.  Invalid input(s): CK  Micro Results No results found for this or any previous visit (from the past 240 hours).  Radiology Report CT ABDOMEN WO CONTRAST Result Date: 05/16/2024 CLINICAL DATA:  Abdominal pain.  Rule out gallstone pancreatitis EXAM: CT ABDOMEN WITHOUT CONTRAST TECHNIQUE: Multidetector CT imaging of the abdomen was performed following the standard protocol without IV contrast. RADIATION DOSE REDUCTION: This exam was performed according to the departmental dose-optimization program which includes automated exposure control, adjustment of the mA and/or kV according to patient size and/or use of iterative reconstruction technique. COMPARISON:  Same day abdominal ultrasound and CT abdomen pelvis 11/02/2023 FINDINGS: Lower chest: No acute abnormality. Hepatobiliary: Marked hepatic steatosis. Sludge and stones in the gallbladder without wall thickening or pericholecystic fluid to suggest cholecystitis. No biliary dilation or radiopaque choledocholithiasis. Pancreas: Fluid about the head of the pancreas and descending duodenum. 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 No pancreatic ductal dilation. Spleen: Unremarkable. Adrenals/Urinary Tract: Normal adrenal glands. Punctate nonobstructing stones in the right kidney. No hydronephrosis.  Stomach/Bowel: Wall thickening about the distal esophagus compatible with esophagitis. Stomach is within normal limits. Edema and wall thickening about the descending duodenum. This may be related to groove pancreatitis or primary duodenitis. Mural fatty infiltration within the ascending and proximal transverse colon can be a normal finding or due to sequela of chronic inflammation. No evidence of active colitis. Normal appendix. Vascular/Lymphatic: No significant vascular findings are present. No enlarged abdominal or pelvic lymph nodes. Other: No organized fluid collection.  No free intraperitoneal air. Musculoskeletal: No acute fracture. IMPRESSION: 1. Fluid about the head of the pancreas and thick-walled descending duodenum. Findings may be due to groove  pancreatitis or primary duodenitis. 2. Sludge and stones in the gallbladder without evidence of cholecystitis. No biliary dilation. 3. Marked hepatic steatosis. 4. Punctate nonobstructing stones in the right kidney. 5. Similar distal esophagitis. Electronically Signed   By: Norman Gatlin M.D.   On: 05/16/2024 22:47   US  Abdomen Limited Result Date: 05/16/2024 CLINICAL DATA:  Abdominal pain. EXAM: ULTRASOUND ABDOMEN LIMITED RIGHT UPPER QUADRANT COMPARISON:  04/20/2023. FINDINGS: Gallbladder: No gallbladder wall thickening, pericholecystic fluid, or positive sonographic Murphy sign reported by the sonographer. 1.2 cm stone in the gallbladder fundus. Common bile duct: Diameter: 1.6 mm Liver: No focal lesion identified. Diffusely increased hepatic parenchymal echogenicity, compatible with hepatic steatosis. Portal vein is patent on color Doppler imaging with normal direction of blood flow towards the liver. Other: None. IMPRESSION: 1. Cholelithiasis without sonographic evidence of acute cholecystitis. 2. Hepatic steatosis. Electronically Signed   By: Harrietta Sherry M.D.   On: 05/16/2024 15:27     Signature  -   Lavada Stank M.D on 05/18/2024 at 8:39 AM    -  To page go to [email or web address]

## 2024-05-18 NOTE — Progress Notes (Signed)
 Subjective: Feels pain is getting better today. Tolerating liquids.   Objective: Vital signs in last 24 hours: Temp:  [98.1 F (36.7 C)-99.5 F (37.5 C)] 99.2 F (37.3 C) (06/29 1234) Pulse Rate:  [99-153] 122 (06/29 1124) Resp:  [14-21] 18 (06/29 1234) BP: (98-130)/(79-104) 113/87 (06/29 1234) SpO2:  [94 %-99 %] 95 % (06/29 0415) Last BM Date : 05/17/24  Intake/Output from previous day: 06/28 0701 - 06/29 0700 In: 1350 [P.O.:240; I.V.:1010; IV Piggyback:100] Out: 600 [Urine:600] Intake/Output this shift: Total I/O In: -  Out: 350 [Urine:350]  PE: General: resting comfortably, NAD Neuro: alert and oriented, no focal deficits Resp: normal work of breathing room air CV: tachycardic, regular Abdomen: soft, nondistended, minimally tender to palpation Extremities: warm and well-perfused   Lab Results:  Recent Labs    05/17/24 0430 05/18/24 0741  WBC 6.9 5.6  HGB 11.8* 12.1  HCT 35.7* 37.2  PLT 85* 105*   BMET Recent Labs    05/17/24 0430 05/18/24 0741  NA 134* 136  K 4.1 3.4*  CL 97* 100  CO2 19* 20*  GLUCOSE 113* 106*  BUN 5* <5*  CREATININE 0.77 0.70  CALCIUM 8.6* 8.6*   PT/INR No results for input(s): LABPROT, INR in the last 72 hours. CMP     Component Value Date/Time   NA 136 05/18/2024 0741   K 3.4 (L) 05/18/2024 0741   CL 100 05/18/2024 0741   CO2 20 (L) 05/18/2024 0741   GLUCOSE 106 (H) 05/18/2024 0741   BUN <5 (L) 05/18/2024 0741   CREATININE 0.70 05/18/2024 0741   CALCIUM 8.6 (L) 05/18/2024 0741   PROT 6.4 (L) 05/18/2024 0741   ALBUMIN 3.3 (L) 05/18/2024 0741   AST 57 (H) 05/18/2024 0741   ALT 37 05/18/2024 0741   ALKPHOS 58 05/18/2024 0741   BILITOT 0.9 05/18/2024 0741   GFRNONAA >60 05/18/2024 0741   GFRAA >60 04/21/2019 1204   Lipase     Component Value Date/Time   LIPASE 264 (H) 05/16/2024 1333       Studies/Results: CT ABDOMEN WO CONTRAST Result Date: 05/16/2024 CLINICAL DATA:  Abdominal pain.  Rule out  gallstone pancreatitis EXAM: CT ABDOMEN WITHOUT CONTRAST TECHNIQUE: Multidetector CT imaging of the abdomen was performed following the standard protocol without IV contrast. RADIATION DOSE REDUCTION: This exam was performed according to the departmental dose-optimization program which includes automated exposure control, adjustment of the mA and/or kV according to patient size and/or use of iterative reconstruction technique. COMPARISON:  Same day abdominal ultrasound and CT abdomen pelvis 11/02/2023 FINDINGS: Lower chest: No acute abnormality. Hepatobiliary: Marked hepatic steatosis. Sludge and stones in the gallbladder without wall thickening or pericholecystic fluid to suggest cholecystitis. No biliary dilation or radiopaque choledocholithiasis. Pancreas: Fluid about the head of the pancreas and descending duodenum. 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 No pancreatic ductal dilation. Spleen: Unremarkable. Adrenals/Urinary Tract: Normal adrenal glands. Punctate nonobstructing stones in the right kidney. No hydronephrosis. Stomach/Bowel: Wall thickening about the distal esophagus compatible with esophagitis. Stomach is within normal limits. Edema and wall thickening about the descending duodenum. This may be related to groove pancreatitis or primary duodenitis. Mural fatty infiltration within the ascending and proximal transverse colon can be a normal finding or due to sequela of chronic inflammation. No evidence of active colitis. Normal appendix. Vascular/Lymphatic: No significant vascular findings are present. No enlarged abdominal or pelvic lymph nodes. Other: No organized fluid collection.  No free intraperitoneal air. Musculoskeletal:  No acute fracture. IMPRESSION: 1. Fluid about the head of the pancreas and thick-walled descending duodenum. 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 2. Sludge and stones in the gallbladder without evidence of cholecystitis. No  biliary dilation. 3. Marked hepatic steatosis. 4. Punctate nonobstructing stones in the right kidney. 5. Similar distal esophagitis. Electronically Signed   By: Norman Gatlin M.D.   On: 05/16/2024 22:47   US  Abdomen Limited Result Date: 05/16/2024 CLINICAL DATA:  Abdominal pain. EXAM: ULTRASOUND ABDOMEN LIMITED RIGHT UPPER QUADRANT COMPARISON:  04/20/2023. FINDINGS: Gallbladder: No gallbladder wall thickening, pericholecystic fluid, or positive sonographic Murphy sign reported by the sonographer. 1.2 cm stone in the gallbladder fundus. Common bile duct: Diameter: 1.6 mm Liver: No focal lesion identified. Diffusely increased hepatic parenchymal echogenicity, compatible with hepatic steatosis. Portal vein is patent on color Doppler imaging with normal direction of blood flow towards the liver. Other: None. IMPRESSION: 1. Cholelithiasis without sonographic evidence of acute cholecystitis. 2. Hepatic steatosis. Electronically Signed   By: Harrietta Sherry M.D.   On: 05/16/2024 15:27    Anti-infectives: Anti-infectives (From admission, onward)    None        Assessment/Plan 33 yo female with acute pancreatitis, likely EtOH-induced, but does have gallstones and mildly elevated LFTs. LFTs are improving, abdominal pain is also improving. Ok to continue diet advancement as tolerated. Surgery will follow.    LOS: 2 days    Deborah Dawn, MD [facility], Hepatobiliary and Pancreatic Surgery 05/18/24 12:40 PM

## 2024-05-18 NOTE — Plan of Care (Signed)
  Problem: Education: Goal: Knowledge of General Education information will improve Description: Including pain rating scale, medication(s)/side effects and non-pharmacologic comfort measures Outcome: Progressing   Problem: Health Behavior/Discharge Planning: Goal: Ability to manage health-related needs will improve Outcome: Progressing   Problem: Clinical Measurements: Goal: Ability to maintain clinical measurements within normal limits will improve Outcome: Progressing Goal: Cardiovascular complication will be avoided Outcome: Progressing   Problem: Activity: Goal: Risk for activity intolerance will decrease Outcome: Progressing   Problem: Nutrition: Goal: Adequate nutrition will be maintained Outcome: Progressing   Problem: Coping: Goal: Level of anxiety will decrease Outcome: Progressing   

## 2024-05-19 DIAGNOSIS — E8729 Other acidosis: Secondary | ICD-10-CM | POA: Diagnosis not present

## 2024-05-19 DIAGNOSIS — F10929 Alcohol use, unspecified with intoxication, unspecified: Secondary | ICD-10-CM | POA: Diagnosis not present

## 2024-05-19 LAB — COMPREHENSIVE METABOLIC PANEL WITH GFR
ALT: 41 U/L (ref 0–44)
AST: 80 U/L — ABNORMAL HIGH (ref 15–41)
Albumin: 3 g/dL — ABNORMAL LOW (ref 3.5–5.0)
Alkaline Phosphatase: 50 U/L (ref 38–126)
Anion gap: 11 (ref 5–15)
BUN: <5 mg/dL — ABNORMAL LOW (ref 6–20)
CO2: 23 mmol/L (ref 22–32)
Calcium: 8.3 mg/dL — ABNORMAL LOW (ref 8.9–10.3)
Chloride: 101 mmol/L (ref 98–111)
Creatinine, Ser: 0.74 mg/dL (ref 0.44–1.00)
GFR, Estimated: >60 mL/min (ref >60)
Glucose, Bld: 100 mg/dL — ABNORMAL HIGH (ref 70–99)
Potassium: 3.5 mmol/L (ref 3.5–5.1)
Sodium: 135 mmol/L (ref 135–145)
Total Bilirubin: 1.1 mg/dL (ref 0.0–1.2)
Total Protein: 6 g/dL — ABNORMAL LOW (ref 6.5–8.1)

## 2024-05-19 LAB — CBC WITH DIFFERENTIAL/PLATELET
Abs Immature Granulocytes: 0.05 10*3/uL (ref 0.00–0.07)
Basophils Absolute: 0.1 10*3/uL (ref 0.0–0.1)
Basophils Relative: 1 %
Eosinophils Absolute: 0.1 10*3/uL (ref 0.0–0.5)
Eosinophils Relative: 1 %
HCT: 32.8 % — ABNORMAL LOW (ref 36.0–46.0)
Hemoglobin: 10.5 g/dL — ABNORMAL LOW (ref 12.0–15.0)
Immature Granulocytes: 1 %
Lymphocytes Relative: 31 %
Lymphs Abs: 1.3 10*3/uL (ref 0.7–4.0)
MCH: 31 pg (ref 26.0–34.0)
MCHC: 32 g/dL (ref 30.0–36.0)
MCV: 96.8 fL (ref 80.0–100.0)
Monocytes Absolute: 0.8 10*3/uL (ref 0.1–1.0)
Monocytes Relative: 19 %
Neutro Abs: 2 10*3/uL (ref 1.7–7.7)
Neutrophils Relative %: 47 %
Platelets: 106 10*3/uL — ABNORMAL LOW (ref 150–400)
RBC: 3.39 MIL/uL — ABNORMAL LOW (ref 3.87–5.11)
RDW: 14 % (ref 11.5–15.5)
WBC: 4.3 10*3/uL (ref 4.0–10.5)
nRBC: 0 % (ref 0.0–0.2)

## 2024-05-19 LAB — PHOSPHORUS: Phosphorus: 2.1 mg/dL — ABNORMAL LOW (ref 2.5–4.6)

## 2024-05-19 LAB — MAGNESIUM: Magnesium: 1.9 mg/dL (ref 1.7–2.4)

## 2024-05-19 MED ORDER — POTASSIUM PHOSPHATES 15 MMOLE/5ML IV SOLN
30.0000 mmol | Freq: Once | INTRAVENOUS | Status: DC
  Filled 2024-05-19: qty 10

## 2024-05-19 MED ORDER — POTASSIUM CHLORIDE CRYS ER 20 MEQ PO TBCR: 20.0000 meq | EXTENDED_RELEASE_TABLET | Freq: Once | ORAL | Status: DC

## 2024-05-19 NOTE — Plan of Care (Signed)
  Problem: Education: Goal: Knowledge of General Education information will improve Description Including pain rating scale, medication(s)/side effects and non-pharmacologic comfort measures Outcome: Progressing   Problem: Health Behavior/Discharge Planning: Goal: Ability to manage health-related needs will improve Outcome: Progressing   Problem: Clinical Measurements: Goal: Will remain free from infection Outcome: Progressing Goal: Cardiovascular complication will be avoided Outcome: Progressing   Problem: Activity: Goal: Risk for activity intolerance will decrease Outcome: Progressing   Problem: Coping: Goal: Level of anxiety will decrease Outcome: Progressing   

## 2024-05-19 NOTE — Progress Notes (Signed)
   Subjective/Chief Complaint: No complaints.  Denies abdominal pain.  States she is going home today.   Objective: Vital signs in last 24 hours: Temp:  [98.1 F (36.7 C)-99.2 F (37.3 C)] 98.6 F (37 C) (06/30 0725) Pulse Rate:  [92-122] 101 (06/30 0342) Resp:  [17-19] 19 (06/30 0342) BP: (104-123)/(71-92) 117/87 (06/30 0725) SpO2:  [97 %-100 %] 99 % (06/30 0342) Last BM Date : 05/18/24  Intake/Output from previous day: 06/29 0701 - 06/30 0700 In: -  Out: 1450 [Urine:1450] Intake/Output this shift: No intake/output data recorded.  General appearance: alert and cooperative Resp: clear to auscultation bilaterally Cardio: regular rate and rhythm GI: soft, nontender  Lab Results:  Recent Labs    05/18/24 0741 05/19/24 0633  WBC 5.6 4.3  HGB 12.1 10.5*  HCT 37.2 32.8*  PLT 105* 106*   BMET Recent Labs    05/18/24 0741 05/19/24 0633  NA 136 135  K 3.4* 3.5  CL 100 101  CO2 20* 23  GLUCOSE 106* 100*  BUN <5* <5*  CREATININE 0.70 0.74  CALCIUM 8.6* 8.3*   PT/INR No results for input(s): LABPROT, INR in the last 72 hours. ABG Recent Labs    05/16/24 1832  HCO3 11.0*    Studies/Results: No results found.  Anti-infectives: Anti-infectives (From admission, onward)    None       Assessment/Plan: s/p * No surgery found * Advance diet Discharge Likely alcoholic pancreatitis.  No role for surgery at this point.  LOS: 3 days    Deward Null III 05/19/2024

## 2024-05-19 NOTE — Progress Notes (Signed)
 PROGRESS NOTE                                                                                                                                                                                                             Patient Demographics:    Deborah Juarez, is a 33 y.o. female, DOB - 12-Aug-1991, FMW:[id number]  Outpatient Primary MD for the patient is Verena Mems, MD    LOS - 3  Admit date - 05/16/2024    Chief Complaint  Patient presents with   Abdominal Pain       Brief Narrative (HPI from H&P)   33 y.o. female with a history of alcohol use disorder, anxiety, GERD, asthma.  Patient presented secondary to abdominal pain was found to have evidence of acute metabolic acidosis in addition to evidence of pancreatitis with associated cholelithiasis.  Patient managed with IV fluids and analgesics.  General surgery and GI consulted.    Subjective:   Patient in bed, appears comfortable, denies any headache, no fever, no chest pain or pressure, no shortness of breath , no abdominal pain. No new focal weakness.    Assessment  & Plan :    Acute metabolic acidosis, due to alcohol abuse/starvation ketoacidosis and alcoholic pancreatitis. Patient has been strictly counseled to abstain from alcohol, pancreatitis being treated conservatively with bowel rest, IV fluids and pain control, GI on board.  Right upper quadrant ultrasound shows gallstones however no signs of CBD obstruction or cholecystitis.  With conservative management she is improving, general surgery also on board.  Increase diet to soft on 05/19/2024 and monitor.  Still in DTs  Alcoholic hepatitis, fatty liver, chronic thrombocytopenia Counseled to abstain from alcohol, monitor platelets, no signs of bleeding  Early DTs.  Librium and CIWA protocol along with folic acid  and thiamine  supplementation and IV fluids.  Patient at times threatening to leave AMA, counseled to  stay in the hospital.   Sinus tachycardia Combination of above, hydrated, pain control, stable TSH and free T4, added low-dose beta-blocker.   THC use Patient counseled on admission  Generalized anxiety disorder - Continue on Librium and along with benzodiazepine as needed   Previous history of prolonged QTc Current EKG shows QTc of 472 ms.  Monitor electrolytes, low-dose beta-blocker.  Hypokalemia, hypophosphatemia.  Replaced.       Condition - Fair  Family Communication  :   Husband bedside on 05/18/2024  Code Status : Full code  Consults  :  GI  PUD Prophylaxis : IV Pepcid    Procedures  :     Right upper quadrant ultrasound.  Gallstones.    CT - acute pancreatitis/duodenitis, gallstones, fatty liver      Disposition Plan  :    Status is: Inpatient  DVT Prophylaxis  :    enoxaparin  (LOVENOX ) injection 40 mg Start: 05/17/24 1000 SCDs Start: 05/16/24 2014 Place TED hose Start: 05/16/24 2014    Lab Results  Component Value Date   PLT 106 (L) 05/19/2024    Diet :  Diet Order             Diet full liquid Room service appropriate? Yes; Fluid consistency: Thin  Diet effective now                    Inpatient Medications  Scheduled Meds:  chlordiazePOXIDE  15 mg Oral TID   enoxaparin  (LOVENOX ) injection  40 mg Subcutaneous Q24H   famotidine   20 mg Oral BID   feeding supplement  1 Container Oral TID BM   folic acid   1 mg Oral Daily   LORazepam   0-4 mg Oral Q6H   Followed by   LORazepam   0-4 mg Oral Q12H   metoprolol tartrate  25 mg Oral BID   multivitamin with minerals  1 tablet Oral Daily   potassium chloride   20 mEq Oral Once   thiamine   100 mg Oral Daily   Or   thiamine   100 mg Intravenous Daily   Continuous Infusions:  lactated ringers  75 mL/hr at 05/19/24 0511   potassium PHOSPHATE IVPB (in mmol)     PRN Meds:.alum & mag hydroxide-simeth, bisacodyl, fentaNYL  (SUBLIMAZE ) injection, LORazepam  **OR** LORazepam , metoprolol tartrate,  trimethobenzamide  Antibiotics  :    Anti-infectives (From admission, onward)    None         Objective:   Vitals:   05/19/24 0341 05/19/24 0342 05/19/24 0513 05/19/24 0725  BP:  114/71 114/80 117/87  Pulse: 92 (!) 101    Resp: 17 19    Temp:  98.1 F (36.7 C)  98.6 F (37 C)  TempSrc:  Oral  Oral  SpO2: 97% 99%    Weight:      Height:        Wt Readings from Last 3 Encounters:  05/16/24 63.5 kg  01/17/24 61.2 kg  06/05/23 61.2 kg     Intake/Output Summary (Last 24 hours) at 05/19/2024 0811 Last data filed at 05/19/2024 0000 Gross per 24 hour  Intake --  Output 1100 ml  Net -1100 ml     Physical Exam  Awake Alert, No new F.N deficits, mild Dts [place].AT,PERRAL Supple Neck, No JVD,   Symmetrical Chest wall movement, Good air movement bilaterally, CTAB RRR,No Gallops,Rubs or new Murmurs,  +ve B.Sounds, Abd Soft, No tenderness,   No Cyanosis, Clubbing or edema     Data Review:    Recent Labs  Lab 05/16/24 1333 05/16/24 1832 05/17/24 0430 05/18/24 0741 05/19/24 0633  WBC 6.8  --  6.9 5.6 4.3  HGB 11.7* 13.6 11.8* 12.1 10.5*  HCT 37.5 40.0 35.7* 37.2 32.8*  PLT 112*  --  85* 105* 106*  MCV 99.5  --  94.4 94.4 96.8  MCH 31.0  --  31.2 30.7 31.0  MCHC 31.2  --  33.1 32.5 32.0  RDW 14.2  --  13.7 13.7 14.0  LYMPHSABS 1.0  --   --   --  1.3  MONOABS 0.4  --   --   --  0.8  EOSABS 0.0  --   --   --  0.1  BASOSABS 0.1  --   --   --  0.1    Recent Labs  Lab 05/16/24 1333 05/16/24 1747 05/16/24 1832 05/16/24 1937 05/16/24 2331 05/17/24 0430 05/18/24 0741 05/19/24 0633  NA 140  --  134*  --  131* 134* 136 135  K 3.6  --  4.0  --  3.8 4.1 3.4* 3.5  CL 95*  --   --   --  96* 97* 100 101  CO2 9*  --   --   --  20* 19* 20* 23  ANIONGAP 36*  --   --   --  15 18* 16* 11  GLUCOSE 117*  --   --   --  186* 113* 106* 100*  BUN 5*  --   --   --  5* 5* <5* <5*  CREATININE 1.06*  --   --   --  0.85 0.77 0.70 0.74  AST 161*  --   --   --   --  82* 57* 80*   ALT 66*  --   --   --   --  46* 37 41  ALKPHOS 62  --   --   --   --  52 58 50  BILITOT 1.5*  --   --   --   --  2.2* 0.9 1.1  ALBUMIN 4.6  --   --   --   --  3.4* 3.3* 3.0*  LATICACIDVEN  --  7.5*  --  5.1* 1.9 1.3  --   --   TSH  --   --   --   --   --   --  2.205  --   MG  --   --   --   --   --   --  1.7 1.9  PHOS  --   --   --   --   --   --  1.3* 2.1*  CALCIUM 9.5  --   --   --  8.5* 8.6* 8.6* 8.3*      Recent Labs  Lab 05/16/24 1333 05/16/24 1747 05/16/24 1937 05/16/24 2331 05/17/24 0430 05/18/24 0741 05/19/24 9366  LATICACIDVEN  --  7.5* 5.1* 1.9 1.3  --   --   TSH  --   --   --   --   --  2.205  --   MG  --   --   --   --   --  1.7 1.9  CALCIUM 9.5  --   --  8.5* 8.6* 8.6* 8.3*    --------------------------------------------------------------------------------------------------------------- No results found for: CHOL, HDL, LDLCALC, LDLDIRECT, TRIG, CHOLHDL  No results found for: HGBA1C Recent Labs    05/18/24 0741 05/18/24 0900  TSH 2.205  --   FREET4  --  0.78   No results for input(s): VITAMINB12, FOLATE, FERRITIN, TIBC, IRON, RETICCTPCT in the last 72 hours. ------------------------------------------------------------------------------------------------------------------ Cardiac Enzymes No results for input(s): CKMB, TROPONINI, MYOGLOBIN in the last 168 hours.  Invalid input(s): CK  Micro Results No results found for this or any previous visit (from the past 240 hours).  Radiology Report No results found.    Signature  -   Lavada Stank M.D on 05/19/2024 at  8:11 AM   -  To page go to [email or web address]

## 2024-05-19 NOTE — Discharge Summary (Signed)
 AMA  Patient at this time expresses desire to leave the Hospital immidiately, patient has been warned that this is not Medically advisable at this time, and can result in Medical complications like Death and Disability, patient understands and accepts the risks involved and assumes full responsibilty of this decision.   Lavada Stank M.D on 05/19/2024 at 9:18 AM  [facility]  Time < 30 minutes  Last Note Below                                                                      PROGRESS NOTE                                                                                                                                                                                                             Patient Demographics:    Deborah Juarez, is a 34 y.o. female, DOB - Jan 12, 1991, FMW:[id number]  Outpatient Primary MD for the patient is Verena Mems, MD    LOS - 3  Admit date - 05/16/2024    Chief Complaint  Patient presents with   Abdominal Pain       Brief Narrative (HPI from H&P)   33 y.o. female with a history of alcohol use disorder, anxiety, GERD, asthma.  Patient presented secondary to abdominal pain was found to have evidence of acute metabolic acidosis in addition to evidence of pancreatitis with associated cholelithiasis.  Patient managed with IV fluids and analgesics.  General surgery and GI consulted.    Subjective:   Patient in bed, appears comfortable, denies any headache, no fever, no chest pain or pressure, no shortness of breath , no abdominal pain. No new focal weakness.    Assessment  & Plan :    Acute metabolic acidosis, due to alcohol abuse/starvation ketoacidosis and alcoholic pancreatitis. Patient has been strictly counseled to abstain from alcohol, pancreatitis being treated conservatively with bowel rest, IV  fluids and pain control, GI on board.  Right upper quadrant ultrasound shows gallstones however no signs of CBD obstruction or cholecystitis.  With conservative management she is improving, general surgery also on board.  Increase diet to soft on 05/19/2024 and monitor.  Still in DTs  Alcoholic hepatitis, fatty liver, chronic thrombocytopenia Counseled to abstain from alcohol, monitor platelets, no signs of bleeding  Early DTs.  Librium and CIWA protocol along with folic acid  and thiamine  supplementation and IV fluids.  Patient at times threatening to leave AMA, counseled to stay in the hospital.   Sinus tachycardia Combination of above, hydrated, pain control, stable TSH and free T4, added low-dose beta-blocker.   THC use Patient counseled on admission  Generalized anxiety disorder - Continue on Librium and along with benzodiazepine as needed   Previous history of prolonged QTc Current EKG shows QTc of 472 ms.  Monitor electrolytes, low-dose beta-blocker.  Hypokalemia, hypophosphatemia.  Replaced.       Condition - Fair  Family Communication  :   Husband bedside on 05/18/2024  Code Status : Full code  Consults  :  GI  PUD Prophylaxis : IV Pepcid    Procedures  :     Right upper quadrant ultrasound.  Gallstones.    CT - acute pancreatitis/duodenitis, gallstones, fatty liver      Disposition Plan  :    Status is: Inpatient  DVT Prophylaxis  :    enoxaparin  (LOVENOX ) injection 40 mg Start: 05/17/24 1000 SCDs Start: 05/16/24 2014 Place TED hose Start: 05/16/24 2014    Lab Results  Component Value Date   PLT 106 (L) 05/19/2024    Diet :  Diet Order             DIET SOFT Fluid consistency: Thin  Diet effective now                    Inpatient Medications  Scheduled Meds:  chlordiazePOXIDE  15 mg Oral TID   enoxaparin  (LOVENOX ) injection  40 mg Subcutaneous Q24H   famotidine   20 mg Oral BID   feeding supplement  1 Container Oral TID BM   folic acid    1 mg Oral Daily   LORazepam   0-4 mg Oral Q6H   Followed by   LORazepam   0-4 mg Oral Q12H   metoprolol tartrate  25 mg Oral BID   multivitamin with minerals  1 tablet Oral Daily   potassium chloride   20 mEq Oral Once   thiamine   100 mg Oral Daily   Or   thiamine   100 mg Intravenous Daily   Continuous Infusions:  lactated ringers  75 mL/hr at 05/19/24 0511   potassium PHOSPHATE IVPB (in mmol)     PRN Meds:.alum & mag hydroxide-simeth, bisacodyl, fentaNYL  (SUBLIMAZE ) injection, LORazepam  **OR** LORazepam , metoprolol tartrate, trimethobenzamide  Antibiotics  :    Anti-infectives (From admission, onward)    None         Objective:   Vitals:   05/19/24 0341 05/19/24 0342 05/19/24 0513 05/19/24 0725  BP:  114/71 114/80 117/87  Pulse: 92 (!) 101    Resp: 17 19    Temp:  98.1 F (36.7 C)  98.6 F (37 C)  TempSrc:  Oral  Oral  SpO2: 97% 99%    Weight:      Height:        Wt Readings from Last 3 Encounters:  05/16/24 63.5 kg  01/17/24 61.2 kg  06/05/23 61.2 kg     Intake/Output Summary (Last 24 hours) at 05/19/2024 9081  Last data filed at 05/19/2024 0000 Gross per 24 hour  Intake --  Output 1100 ml  Net -1100 ml     Physical Exam  Awake Alert, No new F.N deficits, mild Dts [place].AT,PERRAL Supple Neck, No JVD,   Symmetrical Chest wall movement, Good air movement bilaterally, CTAB RRR,No Gallops,Rubs or new Murmurs,  +ve B.Sounds, Abd Soft, No tenderness,   No Cyanosis, Clubbing or edema     Data Review:    Recent Labs  Lab 05/16/24 1333 05/16/24 1832 05/17/24 0430 05/18/24 0741 05/19/24 0633  WBC 6.8  --  6.9 5.6 4.3  HGB 11.7* 13.6 11.8* 12.1 10.5*  HCT 37.5 40.0 35.7* 37.2 32.8*  PLT 112*  --  85* 105* 106*  MCV 99.5  --  94.4 94.4 96.8  MCH 31.0  --  31.2 30.7 31.0  MCHC 31.2  --  33.1 32.5 32.0  RDW 14.2  --  13.7 13.7 14.0  LYMPHSABS 1.0  --   --   --  1.3  MONOABS 0.4  --   --   --  0.8  EOSABS 0.0  --   --   --  0.1  BASOSABS 0.1  --    --   --  0.1    Recent Labs  Lab 05/16/24 1333 05/16/24 1747 05/16/24 1832 05/16/24 1937 05/16/24 2331 05/17/24 0430 05/18/24 0741 05/19/24 0633  NA 140  --  134*  --  131* 134* 136 135  K 3.6  --  4.0  --  3.8 4.1 3.4* 3.5  CL 95*  --   --   --  96* 97* 100 101  CO2 9*  --   --   --  20* 19* 20* 23  ANIONGAP 36*  --   --   --  15 18* 16* 11  GLUCOSE 117*  --   --   --  186* 113* 106* 100*  BUN 5*  --   --   --  5* 5* <5* <5*  CREATININE 1.06*  --   --   --  0.85 0.77 0.70 0.74  AST 161*  --   --   --   --  82* 57* 80*  ALT 66*  --   --   --   --  46* 37 41  ALKPHOS 62  --   --   --   --  52 58 50  BILITOT 1.5*  --   --   --   --  2.2* 0.9 1.1  ALBUMIN 4.6  --   --   --   --  3.4* 3.3* 3.0*  LATICACIDVEN  --  7.5*  --  5.1* 1.9 1.3  --   --   TSH  --   --   --   --   --   --  2.205  --   MG  --   --   --   --   --   --  1.7 1.9  PHOS  --   --   --   --   --   --  1.3* 2.1*  CALCIUM 9.5  --   --   --  8.5* 8.6* 8.6* 8.3*      Recent Labs  Lab 05/16/24 1333 05/16/24 1747 05/16/24 1937 05/16/24 2331 05/17/24 0430 05/18/24 0741 05/19/24 9366  LATICACIDVEN  --  7.5* 5.1* 1.9 1.3  --   --   TSH  --   --   --   --   --  2.205  --   MG  --   --   --   --   --  1.7 1.9  CALCIUM 9.5  --   --  8.5* 8.6* 8.6* 8.3*    --------------------------------------------------------------------------------------------------------------- No results found for: CHOL, HDL, LDLCALC, LDLDIRECT, TRIG, CHOLHDL  No results found for: HGBA1C Recent Labs    05/18/24 0741 05/18/24 0900  TSH 2.205  --   FREET4  --  0.78   No results for input(s): VITAMINB12, FOLATE, FERRITIN, TIBC, IRON, RETICCTPCT in the last 72 hours. ------------------------------------------------------------------------------------------------------------------ Cardiac Enzymes No results for input(s): CKMB, TROPONINI, MYOGLOBIN in the last 168 hours.  Invalid input(s): CK  Micro  Results No results found for this or any previous visit (from the past 240 hours).  Radiology Report No results found.    Signature  -   Lavada Stank M.D on 05/19/2024 at 9:18 AM   -  To page go to [email or web address]

## 2024-05-19 NOTE — Progress Notes (Signed)
 0900: patient requested to leave AMA has been in communication with staff regarding leaving since overnight. Multiple staff members in cluding myself and Dr. Dennise have discussed the risk of leave AMA including the possibility of death. Patient understands the risks and they do not sway her decision. Patient signed AMA documentation which was placed in chart. Family will come to pick her up.

## 2024-07-15 ENCOUNTER — Other Ambulatory Visit: Payer: Self-pay

## 2024-07-15 MED ORDER — MEDROXYPROGESTERONE ACETATE 150 MG/ML IM SUSY
150.0000 mg | PREFILLED_SYRINGE | INTRAMUSCULAR | 0 refills | Status: DC
  Filled 2024-07-15: qty 1, 90d supply, fill #0

## 2024-09-12 ENCOUNTER — Encounter (HOSPITAL_COMMUNITY): Payer: Self-pay

## 2024-09-12 ENCOUNTER — Other Ambulatory Visit: Payer: Self-pay

## 2024-09-12 ENCOUNTER — Inpatient Hospital Stay (HOSPITAL_COMMUNITY)
Admission: EM | Admit: 2024-09-12 | Discharge: 2024-09-15 | DRG: 439 | Disposition: A | Discharge status: Home / Self Care | Payer: MEDICAID | Attending: Student in an Organized Health Care Education/Training Program | Admitting: Student in an Organized Health Care Education/Training Program

## 2024-09-12 DIAGNOSIS — E876 Hypokalemia: Secondary | ICD-10-CM | POA: Diagnosis present

## 2024-09-12 DIAGNOSIS — F10939 Alcohol use, unspecified with withdrawal, unspecified: Secondary | ICD-10-CM | POA: Diagnosis present

## 2024-09-12 DIAGNOSIS — J45909 Unspecified asthma, uncomplicated: Secondary | ICD-10-CM | POA: Diagnosis present

## 2024-09-12 DIAGNOSIS — F1010 Alcohol abuse, uncomplicated: Principal | ICD-10-CM | POA: Diagnosis present

## 2024-09-12 DIAGNOSIS — K859 Acute pancreatitis without necrosis or infection, unspecified: Principal | ICD-10-CM | POA: Diagnosis present

## 2024-09-12 DIAGNOSIS — K8020 Calculus of gallbladder without cholecystitis without obstruction: Secondary | ICD-10-CM | POA: Diagnosis present

## 2024-09-12 DIAGNOSIS — K760 Fatty (change of) liver, not elsewhere classified: Secondary | ICD-10-CM | POA: Diagnosis present

## 2024-09-12 DIAGNOSIS — Z8709 Personal history of other diseases of the respiratory system: Secondary | ICD-10-CM

## 2024-09-12 DIAGNOSIS — K852 Alcohol induced acute pancreatitis without necrosis or infection: Principal | ICD-10-CM | POA: Diagnosis present

## 2024-09-12 DIAGNOSIS — K219 Gastro-esophageal reflux disease without esophagitis: Secondary | ICD-10-CM | POA: Diagnosis present

## 2024-09-12 DIAGNOSIS — Z823 Family history of stroke: Secondary | ICD-10-CM

## 2024-09-12 DIAGNOSIS — E872 Acidosis, unspecified: Secondary | ICD-10-CM | POA: Diagnosis present

## 2024-09-12 DIAGNOSIS — F411 Generalized anxiety disorder: Secondary | ICD-10-CM | POA: Diagnosis present

## 2024-09-12 DIAGNOSIS — K8521 Alcohol induced acute pancreatitis with uninfected necrosis: Secondary | ICD-10-CM

## 2024-09-12 DIAGNOSIS — Z8249 Family history of ischemic heart disease and other diseases of the circulatory system: Secondary | ICD-10-CM

## 2024-09-12 DIAGNOSIS — F1090 Alcohol use, unspecified, uncomplicated: Secondary | ICD-10-CM | POA: Insufficient documentation

## 2024-09-12 DIAGNOSIS — E8729 Other acidosis: Secondary | ICD-10-CM | POA: Diagnosis present

## 2024-09-12 DIAGNOSIS — Z79899 Other long term (current) drug therapy: Secondary | ICD-10-CM

## 2024-09-12 LAB — CBG MONITORING, ED: Glucose-Capillary: 115 mg/dL — ABNORMAL HIGH (ref 70–99)

## 2024-09-12 LAB — CBC
HCT: 39.9 % (ref 36.0–46.0)
Hemoglobin: 13.5 g/dL (ref 12.0–15.0)
MCH: 32 pg (ref 26.0–34.0)
MCHC: 33.8 g/dL (ref 30.0–36.0)
MCV: 94.5 fL (ref 80.0–100.0)
Platelets: 238 K/uL (ref 150–400)
RBC: 4.22 MIL/uL (ref 3.87–5.11)
RDW: 13.2 % (ref 11.5–15.5)
WBC: 6.4 K/uL (ref 4.0–10.5)
nRBC: 0 % (ref 0.0–0.2)

## 2024-09-12 MED ORDER — PROCHLORPERAZINE EDISYLATE 10 MG/2ML IJ SOLN
10.0000 mg | Freq: Once | INTRAMUSCULAR | Status: AC
  Administered 2024-09-13: 10 mg via INTRAVENOUS
  Filled 2024-09-12: qty 2

## 2024-09-12 MED ORDER — SODIUM CHLORIDE 0.9 % IV BOLUS
1000.0000 mL | Freq: Once | INTRAVENOUS | Status: AC
  Administered 2024-09-13: 1000 mL via INTRAVENOUS

## 2024-09-12 NOTE — ED Triage Notes (Signed)
 Pt arrives with reports of emesis over the last few days and hasn't been able to keep anything down. Pt reports increase alcohol intake over the last little while, denies alcohol use in the last day. Pt reports hx of alcohol withdrawal in the past.

## 2024-09-13 ENCOUNTER — Encounter (HOSPITAL_COMMUNITY): Payer: Self-pay | Admitting: Internal Medicine

## 2024-09-13 ENCOUNTER — Inpatient Hospital Stay (HOSPITAL_COMMUNITY): Payer: MEDICAID

## 2024-09-13 DIAGNOSIS — E876 Hypokalemia: Secondary | ICD-10-CM | POA: Diagnosis present

## 2024-09-13 DIAGNOSIS — J45909 Unspecified asthma, uncomplicated: Secondary | ICD-10-CM | POA: Diagnosis present

## 2024-09-13 DIAGNOSIS — K852 Alcohol induced acute pancreatitis without necrosis or infection: Secondary | ICD-10-CM

## 2024-09-13 DIAGNOSIS — F10930 Alcohol use, unspecified with withdrawal, uncomplicated: Secondary | ICD-10-CM

## 2024-09-13 DIAGNOSIS — F411 Generalized anxiety disorder: Secondary | ICD-10-CM

## 2024-09-13 DIAGNOSIS — E8729 Other acidosis: Secondary | ICD-10-CM

## 2024-09-13 DIAGNOSIS — K8020 Calculus of gallbladder without cholecystitis without obstruction: Secondary | ICD-10-CM | POA: Diagnosis present

## 2024-09-13 DIAGNOSIS — Z79899 Other long term (current) drug therapy: Secondary | ICD-10-CM | POA: Diagnosis not present

## 2024-09-13 DIAGNOSIS — F1010 Alcohol abuse, uncomplicated: Secondary | ICD-10-CM | POA: Diagnosis present

## 2024-09-13 DIAGNOSIS — K760 Fatty (change of) liver, not elsewhere classified: Secondary | ICD-10-CM | POA: Diagnosis present

## 2024-09-13 DIAGNOSIS — Z8709 Personal history of other diseases of the respiratory system: Secondary | ICD-10-CM

## 2024-09-13 DIAGNOSIS — F10939 Alcohol use, unspecified with withdrawal, unspecified: Secondary | ICD-10-CM | POA: Diagnosis not present

## 2024-09-13 DIAGNOSIS — Z8249 Family history of ischemic heart disease and other diseases of the circulatory system: Secondary | ICD-10-CM | POA: Diagnosis not present

## 2024-09-13 DIAGNOSIS — Z823 Family history of stroke: Secondary | ICD-10-CM | POA: Diagnosis not present

## 2024-09-13 DIAGNOSIS — E872 Acidosis, unspecified: Secondary | ICD-10-CM | POA: Diagnosis present

## 2024-09-13 DIAGNOSIS — K219 Gastro-esophageal reflux disease without esophagitis: Secondary | ICD-10-CM | POA: Diagnosis present

## 2024-09-13 DIAGNOSIS — K8521 Alcohol induced acute pancreatitis with uninfected necrosis: Secondary | ICD-10-CM | POA: Diagnosis not present

## 2024-09-13 DIAGNOSIS — F1090 Alcohol use, unspecified, uncomplicated: Secondary | ICD-10-CM

## 2024-09-13 LAB — CBC
HCT: 33.8 % — ABNORMAL LOW (ref 36.0–46.0)
Hemoglobin: 11.1 g/dL — ABNORMAL LOW (ref 12.0–15.0)
MCH: 31.4 pg (ref 26.0–34.0)
MCHC: 32.8 g/dL (ref 30.0–36.0)
MCV: 95.5 fL (ref 80.0–100.0)
Platelets: 179 K/uL (ref 150–400)
RBC: 3.54 MIL/uL — ABNORMAL LOW (ref 3.87–5.11)
RDW: 13.2 % (ref 11.5–15.5)
WBC: 9.6 K/uL (ref 4.0–10.5)
nRBC: 0 % (ref 0.0–0.2)

## 2024-09-13 LAB — COMPREHENSIVE METABOLIC PANEL WITH GFR
ALT: 48 U/L — ABNORMAL HIGH (ref 0–44)
ALT: 61 U/L — ABNORMAL HIGH (ref 0–44)
AST: 75 U/L — ABNORMAL HIGH (ref 15–41)
AST: 82 U/L — ABNORMAL HIGH (ref 15–41)
Albumin: 4 g/dL (ref 3.5–5.0)
Albumin: 4.9 g/dL (ref 3.5–5.0)
Alkaline Phosphatase: 51 U/L (ref 38–126)
Alkaline Phosphatase: 70 U/L (ref 38–126)
Anion gap: 21 — ABNORMAL HIGH (ref 5–15)
Anion gap: 35 — ABNORMAL HIGH (ref 5–15)
BUN: 10 mg/dL (ref 6–20)
BUN: 7 mg/dL (ref 6–20)
CO2: 21 mmol/L — ABNORMAL LOW (ref 22–32)
CO2: 25 mmol/L (ref 22–32)
Calcium: 10 mg/dL (ref 8.9–10.3)
Calcium: 8.6 mg/dL — ABNORMAL LOW (ref 8.9–10.3)
Chloride: 81 mmol/L — ABNORMAL LOW (ref 98–111)
Chloride: 89 mmol/L — ABNORMAL LOW (ref 98–111)
Creatinine, Ser: 0.65 mg/dL (ref 0.44–1.00)
Creatinine, Ser: 0.77 mg/dL (ref 0.44–1.00)
GFR, Estimated: >60 mL/min (ref >60)
GFR, Estimated: >60 mL/min (ref >60)
Glucose, Bld: 100 mg/dL — ABNORMAL HIGH (ref 70–99)
Glucose, Bld: 104 mg/dL — ABNORMAL HIGH (ref 70–99)
Potassium: 3.3 mmol/L — ABNORMAL LOW (ref 3.5–5.1)
Potassium: 4.3 mmol/L (ref 3.5–5.1)
Sodium: 134 mmol/L — ABNORMAL LOW (ref 135–145)
Sodium: 137 mmol/L (ref 135–145)
Total Bilirubin: 0.8 mg/dL (ref 0.0–1.2)
Total Bilirubin: 0.9 mg/dL (ref 0.0–1.2)
Total Protein: 7.2 g/dL (ref 6.5–8.1)
Total Protein: 9 g/dL — ABNORMAL HIGH (ref 6.5–8.1)

## 2024-09-13 LAB — URINALYSIS, ROUTINE W REFLEX MICROSCOPIC
Bilirubin Urine: NEGATIVE
Glucose, UA: NEGATIVE mg/dL
Hgb urine dipstick: NEGATIVE
Ketones, ur: 80 mg/dL — AB
Nitrite: NEGATIVE
Protein, ur: 30 mg/dL — AB
Specific Gravity, Urine: 1.016 (ref 1.005–1.030)
pH: 7 (ref 5.0–8.0)

## 2024-09-13 LAB — HCG, SERUM, QUALITATIVE: Preg, Serum: NEGATIVE

## 2024-09-13 LAB — BETA-HYDROXYBUTYRIC ACID: Beta-Hydroxybutyric Acid: 3.28 mmol/L — ABNORMAL HIGH (ref 0.05–0.27)

## 2024-09-13 LAB — ETHANOL: Alcohol, Ethyl (B): 60 mg/dL — ABNORMAL HIGH (ref <15)

## 2024-09-13 LAB — LIPASE, BLOOD
Lipase: 1118 U/L — ABNORMAL HIGH (ref 11–51)
Lipase: 890 U/L — ABNORMAL HIGH (ref 11–51)

## 2024-09-13 MED ORDER — SODIUM CHLORIDE 0.9 % IV SOLN: 250.0000 mL | INTRAVENOUS | Status: AC | PRN

## 2024-09-13 MED ORDER — CHLORDIAZEPOXIDE HCL 25 MG PO CAPS: 25.0000 mg | ORAL_CAPSULE | Freq: Every day | ORAL | Status: DC

## 2024-09-13 MED ORDER — QUETIAPINE FUMARATE 50 MG PO TABS
50.0000 mg | ORAL_TABLET | Freq: Every day | ORAL | Status: DC
  Administered 2024-09-13 – 2024-09-14 (×2): 50 mg via ORAL
  Filled 2024-09-13 (×2): qty 1

## 2024-09-13 MED ORDER — BOOST / RESOURCE BREEZE PO LIQD CUSTOM
1.0000 | Freq: Three times a day (TID) | ORAL | Status: DC
  Administered 2024-09-14 (×3): 1 via ORAL

## 2024-09-13 MED ORDER — ENOXAPARIN SODIUM 40 MG/0.4ML IJ SOSY
40.0000 mg | PREFILLED_SYRINGE | INTRAMUSCULAR | Status: DC
Start: 2024-09-13 — End: 2024-09-13

## 2024-09-13 MED ORDER — LORAZEPAM 2 MG/ML IJ SOLN
1.0000 mg | INTRAMUSCULAR | Status: DC | PRN
  Administered 2024-09-13: 2 mg via INTRAVENOUS
  Filled 2024-09-13: qty 1

## 2024-09-13 MED ORDER — LORAZEPAM 1 MG PO TABS
1.0000 mg | ORAL_TABLET | ORAL | Status: DC | PRN
  Administered 2024-09-13: 2 mg via ORAL
  Filled 2024-09-13: qty 2

## 2024-09-13 MED ORDER — THIAMINE HCL 100 MG/ML IJ SOLN
100.0000 mg | Freq: Once | INTRAMUSCULAR | Status: AC
  Administered 2024-09-13: 100 mg via INTRAMUSCULAR
  Filled 2024-09-13: qty 2

## 2024-09-13 MED ORDER — CHLORDIAZEPOXIDE HCL 25 MG PO CAPS: 25.0000 mg | ORAL_CAPSULE | Freq: Four times a day (QID) | ORAL | Status: DC | PRN

## 2024-09-13 MED ORDER — OXYCODONE HCL 5 MG PO TABS
5.0000 mg | ORAL_TABLET | Freq: Four times a day (QID) | ORAL | Status: DC | PRN
  Administered 2024-09-13 (×2): 5 mg via ORAL
  Filled 2024-09-13 (×2): qty 1

## 2024-09-13 MED ORDER — SODIUM CHLORIDE 0.9% FLUSH
3.0000 mL | INTRAVENOUS | Status: DC | PRN
Start: 2024-09-13 — End: 2024-09-15
  Administered 2024-09-13: 3 mL via INTRAVENOUS

## 2024-09-13 MED ORDER — SODIUM CHLORIDE 0.9% FLUSH
3.0000 mL | Freq: Two times a day (BID) | INTRAVENOUS | Status: DC
  Administered 2024-09-13 – 2024-09-14 (×3): 3 mL via INTRAVENOUS

## 2024-09-13 MED ORDER — ACETAMINOPHEN 650 MG RE SUPP: 650.0000 mg | Freq: Four times a day (QID) | RECTAL | Status: DC | PRN

## 2024-09-13 MED ORDER — SODIUM BICARBONATE 8.4 % IV SOLN
25.0000 meq | Freq: Once | INTRAVENOUS | Status: AC
  Administered 2024-09-13: 25 meq via INTRAVENOUS
  Filled 2024-09-13: qty 50

## 2024-09-13 MED ORDER — TRIMETHOBENZAMIDE HCL 100 MG/ML IM SOLN: 200.0000 mg | Freq: Four times a day (QID) | INTRAMUSCULAR | Status: DC | PRN

## 2024-09-13 MED ORDER — POTASSIUM CHLORIDE 10 MEQ/100ML IV SOLN
10.0000 meq | INTRAVENOUS | Status: AC
  Administered 2024-09-13 (×4): 10 meq via INTRAVENOUS
  Filled 2024-09-13 (×4): qty 100

## 2024-09-13 MED ORDER — LACTATED RINGERS IV SOLN: INTRAVENOUS | Status: AC

## 2024-09-13 MED ORDER — FAMOTIDINE 20 MG PO TABS
40.0000 mg | ORAL_TABLET | Freq: Every day | ORAL | Status: DC
  Administered 2024-09-13 – 2024-09-15 (×3): 40 mg via ORAL
  Filled 2024-09-13 (×3): qty 2

## 2024-09-13 MED ORDER — LEVALBUTEROL HCL 0.63 MG/3ML IN NEBU: 0.6300 mg | INHALATION_SOLUTION | Freq: Four times a day (QID) | RESPIRATORY_TRACT | Status: DC | PRN

## 2024-09-13 MED ORDER — ACETAMINOPHEN 325 MG PO TABS
650.0000 mg | ORAL_TABLET | Freq: Four times a day (QID) | ORAL | Status: DC | PRN
Start: 2024-09-13 — End: 2024-09-15

## 2024-09-13 MED ORDER — HYDROXYZINE HCL 25 MG PO TABS
25.0000 mg | ORAL_TABLET | Freq: Four times a day (QID) | ORAL | Status: DC | PRN
  Administered 2024-09-13: 25 mg via ORAL
  Filled 2024-09-13: qty 1

## 2024-09-13 MED ORDER — LACTATED RINGERS IV BOLUS
1000.0000 mL | INTRAVENOUS | Status: AC
  Administered 2024-09-13: 1000 mL via INTRAVENOUS

## 2024-09-13 MED ORDER — ALUM & MAG HYDROXIDE-SIMETH 200-200-20 MG/5ML PO SUSP: 15.0000 mL | Freq: Four times a day (QID) | ORAL | Status: DC | PRN

## 2024-09-13 MED ORDER — LOPERAMIDE HCL 2 MG PO CAPS: 2.0000 mg | ORAL_CAPSULE | ORAL | Status: DC | PRN

## 2024-09-13 MED ORDER — CHLORDIAZEPOXIDE HCL 25 MG PO CAPS
25.0000 mg | ORAL_CAPSULE | ORAL | Status: DC
  Administered 2024-09-15: 25 mg via ORAL
  Filled 2024-09-13: qty 1

## 2024-09-13 MED ORDER — CHLORDIAZEPOXIDE HCL 25 MG PO CAPS
25.0000 mg | ORAL_CAPSULE | Freq: Four times a day (QID) | ORAL | Status: AC
  Administered 2024-09-13 (×4): 25 mg via ORAL
  Filled 2024-09-13 (×5): qty 1

## 2024-09-13 MED ORDER — HYDROMORPHONE HCL 1 MG/ML IJ SOLN: 0.5000 mg | INTRAMUSCULAR | Status: DC | PRN

## 2024-09-13 MED ORDER — LACTATED RINGERS IV SOLN: INTRAVENOUS | Status: DC

## 2024-09-13 MED ORDER — SODIUM CHLORIDE 0.9% FLUSH
3.0000 mL | Freq: Two times a day (BID) | INTRAVENOUS | Status: DC
  Administered 2024-09-13 – 2024-09-14 (×2): 3 mL via INTRAVENOUS

## 2024-09-13 MED ORDER — CHLORDIAZEPOXIDE HCL 25 MG PO CAPS
25.0000 mg | ORAL_CAPSULE | Freq: Three times a day (TID) | ORAL | Status: AC
  Administered 2024-09-14 (×3): 25 mg via ORAL
  Filled 2024-09-13 (×3): qty 1

## 2024-09-13 MED ORDER — ADULT MULTIVITAMIN W/MINERALS CH
1.0000 | ORAL_TABLET | Freq: Every day | ORAL | Status: DC
  Administered 2024-09-13 – 2024-09-15 (×3): 1 via ORAL
  Filled 2024-09-13 (×3): qty 1

## 2024-09-13 MED ORDER — SODIUM BICARBONATE 8.4 % IV SOLN
INTRAVENOUS | Status: DC
  Filled 2024-09-13: qty 1000

## 2024-09-13 NOTE — Final Progress Note (Signed)
 Same-day rounding progress note  Patient seen and examined in the ED.  I agree with Dr. Shareen assessment and plan in the dictated H&P.  Please see her H&P for further details.  Acute on chronic pancreatitis Continue IV hydration, pain management as needed  Time spent 35 minutes

## 2024-09-13 NOTE — ED Notes (Signed)
 Patient transported to CT

## 2024-09-13 NOTE — H&P (Signed)
 History and Physical    Deborah Juarez FMW:[id number] DOB: [birth year] DOA: 09/12/2024  PCP: Verena Mems, MD   Patient coming from: Home   Chief Complaint:  Chief Complaint  Patient presents with   Emesis   ED TRIAGE note:  Pt arrives with reports of emesis over the last few days and hasn't been able to keep anything down. Pt reports increase alcohol intake over the last little while, denies alcohol use in the last day. Pt reports hx of alcohol withdrawal in the past.             HPI:  Deborah Juarez is a 33 y.o. female with medical history significant of a chronic alcohol use disorder, alcohol use disorder associated starvation, ketoacidosis and alcoholic pancreatitis, GERD, anxiety, and asthma presented emergency department with complaining of heavy drinking of alcohol until today with associated intractable nausea and vomiting. Patient denies any fever, chill, diarrhea, tremor, dizziness, headache, weakness, seizure and hematemesis. No other complaint at this time.  Per chart review patient was previously admitted in June 2025 secondary to alcohol withdrawal related metabolic acidosis starvation ketoacidosis alcoholic pancreatitis and delirium tremens required Librium  taper.  ED Course:  At presentation to ED patient is tachycardic and borderline hypertensive. Lab, blood-alcohol Bil 60.  Negative pregnancy test. CBC unremarkable. CMP showing low potassium 3.3, low bicarb 21, elevated anion gap 35, elevated AST 82, ALT 61 normal alkaline phosphatase and bilirubin level. Elevated lipase level 890.  In the ED patient received 1 L of NS bolus.  Hospitalist consulted for further evaluation management of intractable nausea vomiting, hypokalemia, metabolic acidosis, transaminitis and pancreatitis.   Significant labs in the ED: Lab Orders         Lipase, blood         Comprehensive metabolic panel         CBC         Urinalysis, Routine w reflex  microscopic -Urine, Clean Catch         hCG, serum, qualitative         Ethanol         Urinalysis, Routine w reflex microscopic -Urine, Clean Catch         Beta-hydroxybutyric acid         Comprehensive metabolic panel         CBC         Lipase, blood         Hepatitis panel, acute         CBG monitoring, ED       Review of Systems:  Review of Systems  Constitutional:  Negative for chills, fever and weight loss.  Respiratory:  Negative for cough.   Cardiovascular:  Negative for chest pain.  Gastrointestinal:  Positive for abdominal pain, nausea and vomiting. Negative for blood in stool, constipation, diarrhea and heartburn.  Neurological:  Negative for dizziness, tremors, seizures, weakness and headaches.  Psychiatric/Behavioral:  The patient is not nervous/anxious.     Past Medical History:  Diagnosis Date   Anxiety    Anxiety    Asthma    long  time since used rescue inhaler   GERD (gastroesophageal reflux disease)    Post partum depression     Past Surgical History:  Procedure Laterality Date   BREAST SURGERY     CESAREAN SECTION N/A 03/03/2015   Procedure: CESAREAN SECTION;  Surgeon: Delon Prude, DO;  Location: [facility];  Service: Obstetrics;  Laterality: N/A;     reports that  she has never smoked. She has never used smokeless tobacco. She reports current alcohol use of about 3.0 standard drinks of alcohol per week. She reports that she does not use drugs.  Allergies  Allergen Reactions   Protonix  [Pantoprazole ] Other (See Comments)    Increased/worsened acid reflux    Family History  Problem Relation Age of Onset   Cancer Father        Pacreatic Ca   Heart disease Father    Heart disease Maternal Grandfather    Heart disease Paternal Grandfather    Stroke Paternal Grandfather     Prior to Admission medications   Medication Sig Start Date End Date Taking? Authorizing Provider  hydrOXYzine  (ATARAX ) 25 MG tablet Take 1 tablet (25 mg total) by mouth 3  (three) times daily as needed. 01/18/24   Samtani, Jai-Gurmukh, MD  medroxyPROGESTERone  Acetate 150 MG/ML SUSY Inject 1 mL (150 mg total) into the muscle every 3 (three) months. 07/15/24     ondansetron  (ZOFRAN ) 4 MG tablet Take 1 tablet (4 mg total) by mouth every 6 (six) hours as needed for nausea. 01/18/24   Samtani, Jai-Gurmukh, MD  QUEtiapine  (SEROQUEL ) 100 MG tablet Take 1 tablet (100 mg total) by mouth at bedtime. Patient taking differently: Take 50 mg by mouth at bedtime. 01/18/24   Samtani, Jai-Gurmukh, MD     Physical Exam: Vitals:   09/12/24 2324 09/13/24 0049 09/13/24 0058 09/13/24 0200  BP: (!) 113/101 (!) 137/96  134/86  Pulse: (!) 120 96 87 85  Resp: 17 17 16 17   Temp: 97.9 F (36.6 C)     TempSrc: Oral     SpO2: 100% 100% 100% 100%  Height:        Physical Exam Constitutional:      General: She is not in acute distress.    Appearance: She is ill-appearing.  HENT:     Mouth/Throat:     Mouth: Mucous membranes are moist.  Eyes:     Pupils: Pupils are equal, round, and reactive to light.  Cardiovascular:     Rate and Rhythm: Normal rate and regular rhythm.     Pulses: Normal pulses.  Pulmonary:     Effort: Pulmonary effort is normal.     Breath sounds: Normal breath sounds.  Abdominal:     Palpations: Abdomen is soft.     Tenderness: There is no abdominal tenderness.  Musculoskeletal:     Cervical back: Neck supple.  Skin:    General: Skin is dry.     Capillary Refill: Capillary refill takes less than 2 seconds.  Neurological:     Mental Status: She is alert and oriented to person, place, and time.  Psychiatric:        Mood and Affect: Mood normal.      Labs on Admission: I have personally reviewed following labs and imaging studies  CBC: Recent Labs  Lab 09/12/24 2332  WBC 6.4  HGB 13.5  HCT 39.9  MCV 94.5  PLT 238   Basic Metabolic Panel: Recent Labs  Lab 09/12/24 2332  NA 137  K 3.3*  CL 81*  CO2 21*  GLUCOSE 104*  BUN 10   CREATININE 0.77  CALCIUM 10.0   GFR: CrCl cannot be calculated (Unknown ideal weight.). Liver Function Tests: Recent Labs  Lab 09/12/24 2332  AST 82*  ALT 61*  ALKPHOS 70  BILITOT 0.9  PROT 9.0*  ALBUMIN 4.9   Recent Labs  Lab 09/12/24 2332  LIPASE 890*  No results for input(s): AMMONIA in the last 168 hours. Coagulation Profile: No results for input(s): INR, PROTIME in the last 168 hours. Cardiac Enzymes: No results for input(s): CKTOTAL, CKMB, CKMBINDEX, TROPONINI, TROPONINIHS in the last 168 hours. BNP (last 3 results) No results for input(s): BNP in the last 8760 hours. HbA1C: No results for input(s): HGBA1C in the last 72 hours. CBG: Recent Labs  Lab 09/12/24 2331  GLUCAP 115*   Lipid Profile: No results for input(s): CHOL, HDL, LDLCALC, TRIG, CHOLHDL, LDLDIRECT in the last 72 hours. Thyroid Function Tests: No results for input(s): TSH, T4TOTAL, FREET4, T3FREE, THYROIDAB in the last 72 hours. Anemia Panel: No results for input(s): VITAMINB12, FOLATE, FERRITIN, TIBC, IRON, RETICCTPCT in the last 72 hours. Urine analysis:    Component Value Date/Time   COLORURINE YELLOW 05/16/2024 1333   APPEARANCEUR HAZY (A) 05/16/2024 1333   LABSPEC 1.017 05/16/2024 1333   PHURINE 6.0 05/16/2024 1333   GLUCOSEU NEGATIVE 05/16/2024 1333   HGBUR SMALL (A) 05/16/2024 1333   BILIRUBINUR NEGATIVE 05/16/2024 1333   KETONESUR 80 (A) 05/16/2024 1333   PROTEINUR 100 (A) 05/16/2024 1333   UROBILINOGEN 1.0 08/10/2014 1739   NITRITE NEGATIVE 05/16/2024 1333   LEUKOCYTESUR NEGATIVE 05/16/2024 1333    Radiological Exams on Admission: I have personally reviewed images CT ABDOMEN WO CONTRAST Result Date: 09/13/2024 EXAM: CT ABDOMEN WITHOUT CONTRAST 09/13/2024 02:13:23 AM TECHNIQUE: CT of the abdomen was performed without the administration of intravenous contrast. Multiplanar reformatted images are provided for review.  Automated exposure control, iterative reconstruction, and/or weight based adjustment of the mA/kV was utilized to reduce the radiation dose to as low as reasonably achievable. COMPARISON: 05/16/2024 CLINICAL HISTORY: Pancreatitis. FINDINGS: LOWER CHEST: Visualized portion of the lower chest demonstrates no acute abnormality. HEPATOBILIARY: Severe hepatic steatosis. Cholelithiasis, without pericholecystic inflammatory change. SPLEEN: Spleen demonstrates no acute abnormality. PANCREAS: Pancreatic head fullness without overt inflammatory changes, likely sequelae of prior pancreatitis when correlating with prior CT. ADRENAL GLANDS: Adrenal glands demonstrate no acute abnormality. KIDNEYS: Bilateral punctate nonobstructing renal calculi. No hydronephrosis. No perinephric or periureteral stranding. GI AND BOWEL: Stomach and duodenal sweep demonstrate no acute abnormality. Chronic transverse colonic wall thickening with submucosal fat deposition, suggesting sequelae of prior/chronic inflammation. There is no bowel obstruction. No abnormal bowel wall distension. PERITONEUM AND RETROPERITONEUM: No ascites or free air. Aorta is normal in caliber. LYMPH NODES: No lymphadenopathy. BONES AND SOFT TISSUES: No acute abnormality of the visualized bones. No focal soft tissue abnormality. LIMITATIONS: The pelvis was not imaged. IMPRESSION: 1. Pancreatic head fullness without overt inflammatory changes, consistent with sequelae of prior pancreatitis. 2. Additional ancillary findings, as above. 3. Please note that the pelvis was not imaged. Electronically signed by: Pinkie Pebbles MD 09/13/2024 02:20 AM EDT RP Workstation: [id number]      Assessment/Plan: Principal Problem:   Alcoholic pancreatitis Active Problems:   Increased anion gap metabolic acidosis   Alcohol withdrawal (HCC)   Generalized anxiety disorder   Chronic alcohol use   History of asthma    Assessment and Plan: Alcoholic pancreatitis Chronic  transaminitis -Presented to emergency department complaining of IV drink of alcohol 1 day ago followed by developing intractable nausea vomiting for last 24 hours.  Patient also has abdominal pain.  Denies any hematemesis and melena. - At presentation to ED patient found tachycardic otherwise hemodynamically stable.  Febrile. -CMP showing low potassium 3.3, low chloride 81, slightly low bicarb 21 elevated anion gap 35.  Pregnancy test negative.  CBC unremarkable. - Elevated lipase around  900. -CT abdomen pelvis evidence of groove pancreatitis and primarily duodenitis.  Sludge and gallstone in the gallbladder without evidence of acute cholecystitis.  No biliary dilation.  Mild hepatic steatosis. -Received total 2 L of LR and NS bolus.  Continue maintenance fluid LR 150 cc/h for 10 hours then 125 cc/h for 1 day. -Continue optimal pain and nausea control. Continue clear liquid diet and advance diet as patient tolerates. -Continue to monitor liver function panel and checking hepatitis panel.   High anion gap metabolic acidosis-secondary to vomiting  -Slightly low bicarb 21 and elevated anion gap 35.  Concern for elevated anion gap in the setting of underlying starvation ketoacidosis. -Checking beta-hydroxybutyrate and UA for ketones. -In ED patient received 2 L of IV fluid bolus.  Giving 1 amp of bicarb followed by continue LR at 150 cc/h. - Continue to monitor bicarb and anion gap level.     Duodenitis - CT abdomen pelvis showing pancreatitis and duodenitis. -Starting oral famotidine  40 mg and Maalox as needed.  Alcohol use disorder Chronic alcohol abuse History of delirium tremens -Chronic alcohol use disorder and history of delirium tremens in the setting of chronic alcohol use in the past.  Continue to drinks heavily and last drink was 24 hours ago.  Due to significant nausea vomiting patient unable to drink further. -Starting CIWA protocol.   -Starting Librium  taper dose and Ativan  as  needed. - Continue thiamine , folic acid  and multivitamin. -Counseling provided and consult TOA.  History of asthma -Continue Xopenex as needed  Generalized anxiety disorder -Continue Seroquel  at bedtime  Hypokalemia - Low potassium 3.1.  Replating with IV KCl   DVT prophylaxis: SCD and TED hose.  High risk of GI bleed-deferring pharmacological DVT prophylaxis Code Status:  Full Code Diet: Clear liquid diet Disposition Plan: Continue to monitor improvement of anion gap level and improvement of oral food intake.  Monitor for development of severe alcohol withdrawal. Consults: Case management Admission status:   Inpatient, Telemetry bed  Severity of Illness: The appropriate patient status for this patient is INPATIENT. Inpatient status is judged to be reasonable and necessary in order to provide the required intensity of service to ensure the patient's safety. The patient's presenting symptoms, physical exam findings, and initial radiographic and laboratory data in the context of their chronic comorbidities is felt to place them at high risk for further clinical deterioration. Furthermore, it is not anticipated that the patient will be medically stable for discharge from the hospital within 2 midnights of admission.   * I certify that at the point of admission it is my clinical judgment that the patient will require inpatient hospital care spanning beyond 2 midnights from the point of admission due to high intensity of service, high risk for further deterioration and high frequency of surveillance required.DEWAINE    Wandalee Klang, MD [facility]  How to contact the [facility] Attending or Consulting provider 7A - 7P or covering provider during after hours 7P -7A, for this patient.  Check the care team in [facility] and look for a) attending/consulting [facility] provider listed and b) the [facility] team listed Log into [email or web address] and use [facility] universal password to access. If you do not have the  password, please contact the hospital operator. Locate the [facility] provider you are looking for under [facility] and page to a number that you can be directly reached. If you still have difficulty reaching the provider, please page the [facility] (Director on Call) for the Hospitalists listed on amion  for assistance.  09/13/2024, 2:32 AM

## 2024-09-13 NOTE — ED Provider Notes (Signed)
 [place] EMERGENCY DEPARTMENT AT [facility] Provider Note  CSN: [id number] Arrival date & time: 09/12/24 2314  Chief Complaint(s) Emesis  HPI Deborah Juarez is a 33 y.o. female with a past medical history listed below including alcohol use disorder here for 1 day of nausea nonbloody nonbilious emesis.  Patient reports increased alcohol consumption over the past several weeks due to emotional stress.  Reports that she drinks 5-7 shots of vodka in the evening.  Reports last drink yesterday.  She denies any abdominal pain.  No headache.  No other physical complaints.   Emesis   Past Medical History Past Medical History:  Diagnosis Date   Anxiety    Anxiety    Asthma    long  time since used rescue inhaler   GERD (gastroesophageal reflux disease)    Post partum depression    Patient Active Problem List   Diagnosis Date Noted   Chronic alcohol use 09/13/2024   History of asthma 09/13/2024   Intractable nausea and vomiting 05/16/2024   Alcohol withdrawal (HCC) 05/16/2024   AKI (acute kidney injury) 05/16/2024   Alcoholic pancreatitis 05/16/2024   Cholelithiasis 05/16/2024   Marijuana use 05/16/2024   Thrombocytopenia 05/16/2024   Generalized anxiety disorder 05/16/2024   Ketoacidosis due to acute alcohol intoxication 05/16/2024   Alcoholic ketoacidosis 01/17/2024   Nausea & vomiting 04/19/2023   Acute gastroenteritis 12/26/2022   Abnormal ECG 12/26/2022   Palpitations 12/26/2022   QT prolongation 12/26/2022   Increased anion gap metabolic acidosis 12/26/2022   Transaminitis 12/26/2022   Depressive disorder 12/18/2022   Normal labor 09/06/2022   Indication for care in labor and delivery, antepartum 09/05/2022   Asthma 03/05/2015   Anxiety 03/05/2015   Status post primary low transverse cesarean section--malpresentation 03/03/2015   Home Medication(s) Prior to Admission medications   Medication Sig Start Date End Date Taking? Authorizing  Provider  hydrOXYzine  (ATARAX ) 25 MG tablet Take 1 tablet (25 mg total) by mouth 3 (three) times daily as needed. 01/18/24   Samtani, Jai-Gurmukh, MD  medroxyPROGESTERone  Acetate 150 MG/ML SUSY Inject 1 mL (150 mg total) into the muscle every 3 (three) months. 07/15/24     ondansetron  (ZOFRAN ) 4 MG tablet Take 1 tablet (4 mg total) by mouth every 6 (six) hours as needed for nausea. 01/18/24   Samtani, Jai-Gurmukh, MD  QUEtiapine  (SEROQUEL ) 100 MG tablet Take 1 tablet (100 mg total) by mouth at bedtime. Patient taking differently: Take 50 mg by mouth at bedtime. 01/18/24   Samtani, Jai-Gurmukh, MD                                                                                                                                    Allergies Protonix  [pantoprazole ]  Review of Systems Review of Systems  Gastrointestinal:  Positive for vomiting.   As noted in HPI  Physical Exam Vital Signs  I have reviewed the triage vital signs BP ROLLEN)  137/96   Pulse 87   Temp 97.9 F (36.6 C) (Oral)   Resp 16   Ht 5' 7 (1.702 m)   SpO2 100%   BMI 21.93 kg/m   Physical Exam Vitals reviewed.  Constitutional:      General: She is not in acute distress.    Appearance: She is well-developed. She is not diaphoretic.  HENT:     Head: Normocephalic and atraumatic.     Comments: Alcoholic stomatitis    Right Ear: External ear normal.     Left Ear: External ear normal.     Nose: Nose normal.  Eyes:     General: No scleral icterus.    Conjunctiva/sclera: Conjunctivae normal.  Neck:     Trachea: Phonation normal.  Cardiovascular:     Rate and Rhythm: Normal rate and regular rhythm.  Pulmonary:     Effort: Pulmonary effort is normal. No respiratory distress.     Breath sounds: No stridor.  Abdominal:     General: There is no distension.     Tenderness: There is no abdominal tenderness. There is no guarding or rebound.  Musculoskeletal:        General: Normal range of motion.     Cervical back: Normal  range of motion.  Neurological:     Mental Status: She is alert and oriented to person, place, and time.  Psychiatric:        Behavior: Behavior normal.     ED Results and Treatments Labs (all labs ordered are listed, but only abnormal results are displayed) Labs Reviewed  LIPASE, BLOOD - Abnormal; Notable for the following components:      Result Value   Lipase 890 (*)    All other components within normal limits  COMPREHENSIVE METABOLIC PANEL WITH GFR - Abnormal; Notable for the following components:   Potassium 3.3 (*)    Chloride 81 (*)    CO2 21 (*)    Glucose, Bld 104 (*)    Total Protein 9.0 (*)    AST 82 (*)    ALT 61 (*)    Anion gap 35 (*)    All other components within normal limits  ETHANOL - Abnormal; Notable for the following components:   Alcohol, Ethyl (B) 60 (*)    All other components within normal limits  CBG MONITORING, ED - Abnormal; Notable for the following components:   Glucose-Capillary 115 (*)    All other components within normal limits  CBC  HCG, SERUM, QUALITATIVE  URINALYSIS, ROUTINE W REFLEX MICROSCOPIC  URINALYSIS, ROUTINE W REFLEX MICROSCOPIC  BETA-HYDROXYBUTYRIC ACID  COMPREHENSIVE METABOLIC PANEL WITH GFR  CBC  LIPASE, BLOOD  HEPATITIS PANEL, ACUTE                                                                                                                         EKG  EKG Interpretation Date/Time:    Ventricular Rate:    PR Interval:    QRS Duration:  QT Interval:    QTC Calculation:   R Axis:      Text Interpretation:         Radiology No results found.  Medications Ordered in ED Medications  lactated ringers  bolus 1,000 mL (has no administration in time range)  potassium chloride  10 mEq in 100 mL IVPB (has no administration in time range)  thiamine  (VITAMIN B1) injection 100 mg (has no administration in time range)  multivitamin with minerals tablet 1 tablet (has no administration in time range)   chlordiazePOXIDE  (LIBRIUM ) capsule 25 mg (has no administration in time range)  hydrOXYzine  (ATARAX ) tablet 25 mg (has no administration in time range)  loperamide  (IMODIUM ) capsule 2-4 mg (has no administration in time range)  chlordiazePOXIDE  (LIBRIUM ) capsule 25 mg (has no administration in time range)    Followed by  chlordiazePOXIDE  (LIBRIUM ) capsule 25 mg (has no administration in time range)    Followed by  chlordiazePOXIDE  (LIBRIUM ) capsule 25 mg (has no administration in time range)    Followed by  chlordiazePOXIDE  (LIBRIUM ) capsule 25 mg (has no administration in time range)  trimethobenzamide  (TIGAN ) injection 200 mg (has no administration in time range)  lactated ringers  infusion (has no administration in time range)  sodium bicarbonate  injection 25 mEq (has no administration in time range)  enoxaparin  (LOVENOX ) injection 40 mg (has no administration in time range)  sodium chloride  flush (NS) 0.9 % injection 3 mL (has no administration in time range)  sodium chloride  flush (NS) 0.9 % injection 3 mL (has no administration in time range)  sodium chloride  flush (NS) 0.9 % injection 3 mL (has no administration in time range)  0.9 %  sodium chloride  infusion (has no administration in time range)  acetaminophen  (TYLENOL ) tablet 650 mg (has no administration in time range)    Or  acetaminophen  (TYLENOL ) suppository 650 mg (has no administration in time range)  HYDROmorphone (DILAUDID) injection 0.5-1 mg (has no administration in time range)  oxyCODONE  (Oxy IR/ROXICODONE ) immediate release tablet 5 mg (has no administration in time range)  LORazepam  (ATIVAN ) tablet 1-4 mg (has no administration in time range)    Or  LORazepam  (ATIVAN ) injection 1-4 mg (has no administration in time range)  sodium chloride  0.9 % bolus 1,000 mL (1,000 mLs Intravenous New Bag/Given 09/13/24 0005)  prochlorperazine  (COMPAZINE ) injection 10 mg (10 mg Intravenous Given 09/13/24 0005)    Procedures Procedures  (including critical care time) Medical Decision Making / ED Course   Medical Decision Making Amount and/or Complexity of Data Reviewed Labs: ordered. Decision-making details documented in ED Course.  Risk Prescription drug management. Decision regarding hospitalization.     Clinical Course as of 09/13/24 0143  Sat Sep 13, 2024  0051 Patient presents with nausea vomiting.  Denies any abdominal pain, chest pain or any other physical complaints.  Differential diagnosis considered.  Workup below.  Given her history of alcohol use disorder, will assess for electrolyte derangements or pancreatitis.  Will also obtain alcohol level to determine level of intoxication.  She was provided with IV fluids and antiemetics. [PC]  0139 CBC without leukocytosis or anemia. CMP with mild hypokalemia.  Mild electrolyte derangements or renal sufficiency.  Mildly elevated transaminitis.  Increased anion gap.  Likely alcoholic ketosis.  EtOH is elevated at 60.  Lipase consistent with acute pancreatitis  Case discussed with Dr.Sundil from hospitalist who will admit patient for further workup and management. [PC]    Clinical Course User Index [PC] Kaliann Coryell, Raynell Moder, MD  Final Clinical Impression(s) / ED Diagnoses Final diagnoses:  None    This chart was dictated using voice recognition software.  Despite best efforts to proofread,  errors can occur which can change the documentation meaning.    Trine Raynell Moder, MD 09/13/24 [phone]

## 2024-09-13 NOTE — Plan of Care (Signed)
  Problem: Clinical Measurements: Goal: Will remain free from infection Outcome: Progressing Goal: Diagnostic test results will improve Outcome: Progressing   Problem: Activity: Goal: Risk for activity intolerance will decrease Outcome: Progressing   Problem: Nutrition: Goal: Adequate nutrition will be maintained Outcome: Progressing

## 2024-09-14 DIAGNOSIS — K852 Alcohol induced acute pancreatitis without necrosis or infection: Secondary | ICD-10-CM | POA: Diagnosis not present

## 2024-09-14 LAB — CBC WITH DIFFERENTIAL/PLATELET
Abs Immature Granulocytes: 0.01 K/uL (ref 0.00–0.07)
Basophils Absolute: 0 K/uL (ref 0.0–0.1)
Basophils Relative: 1 %
Eosinophils Absolute: 0 K/uL (ref 0.0–0.5)
Eosinophils Relative: 1 %
HCT: 32.3 % — ABNORMAL LOW (ref 36.0–46.0)
Hemoglobin: 10.5 g/dL — ABNORMAL LOW (ref 12.0–15.0)
Immature Granulocytes: 0 %
Lymphocytes Relative: 44 %
Lymphs Abs: 2.2 K/uL (ref 0.7–4.0)
MCH: 31.9 pg (ref 26.0–34.0)
MCHC: 32.5 g/dL (ref 30.0–36.0)
MCV: 98.2 fL (ref 80.0–100.0)
Monocytes Absolute: 0.4 K/uL (ref 0.1–1.0)
Monocytes Relative: 9 %
Neutro Abs: 2.3 K/uL (ref 1.7–7.7)
Neutrophils Relative %: 45 %
Platelets: 141 K/uL — ABNORMAL LOW (ref 150–400)
RBC: 3.29 MIL/uL — ABNORMAL LOW (ref 3.87–5.11)
RDW: 13.4 % (ref 11.5–15.5)
WBC: 4.9 K/uL (ref 4.0–10.5)
nRBC: 0 % (ref 0.0–0.2)

## 2024-09-14 LAB — COMPREHENSIVE METABOLIC PANEL WITH GFR
ALT: 30 U/L (ref 0–44)
AST: 42 U/L — ABNORMAL HIGH (ref 15–41)
Albumin: 3.4 g/dL — ABNORMAL LOW (ref 3.5–5.0)
Alkaline Phosphatase: 46 U/L (ref 38–126)
Anion gap: 10 (ref 5–15)
BUN: <5 mg/dL — ABNORMAL LOW (ref 6–20)
CO2: 31 mmol/L (ref 22–32)
Calcium: 8.7 mg/dL — ABNORMAL LOW (ref 8.9–10.3)
Chloride: 97 mmol/L — ABNORMAL LOW (ref 98–111)
Creatinine, Ser: 0.64 mg/dL (ref 0.44–1.00)
GFR, Estimated: >60 mL/min (ref >60)
Glucose, Bld: 122 mg/dL — ABNORMAL HIGH (ref 70–99)
Potassium: 2.7 mmol/L — CL (ref 3.5–5.1)
Sodium: 138 mmol/L (ref 135–145)
Total Bilirubin: 0.9 mg/dL (ref 0.0–1.2)
Total Protein: 5.8 g/dL — ABNORMAL LOW (ref 6.5–8.1)

## 2024-09-14 LAB — PHOSPHORUS: Phosphorus: 1.7 mg/dL — ABNORMAL LOW (ref 2.5–4.6)

## 2024-09-14 LAB — MAGNESIUM: Magnesium: 1.5 mg/dL — ABNORMAL LOW (ref 1.7–2.4)

## 2024-09-14 LAB — LIPASE, BLOOD: Lipase: 1026 U/L — ABNORMAL HIGH (ref 11–51)

## 2024-09-14 MED ORDER — POTASSIUM CHLORIDE 10 MEQ/100ML IV SOLN
10.0000 meq | INTRAVENOUS | Status: AC
  Administered 2024-09-14 (×6): 10 meq via INTRAVENOUS
  Filled 2024-09-14 (×4): qty 100

## 2024-09-14 MED ORDER — MAGNESIUM SULFATE 2 GM/50ML IV SOLN
2.0000 g | Freq: Once | INTRAVENOUS | Status: AC
  Administered 2024-09-14: 2 g via INTRAVENOUS
  Filled 2024-09-14: qty 50

## 2024-09-14 MED ORDER — POTASSIUM CHLORIDE CRYS ER 20 MEQ PO TBCR
20.0000 meq | EXTENDED_RELEASE_TABLET | Freq: Once | ORAL | Status: AC
  Administered 2024-09-14: 20 meq via ORAL
  Filled 2024-09-14: qty 1

## 2024-09-14 MED ORDER — POTASSIUM CHLORIDE 20 MEQ PO PACK
60.0000 meq | PACK | Freq: Once | ORAL | Status: AC
  Administered 2024-09-14: 60 meq via ORAL
  Filled 2024-09-14: qty 3

## 2024-09-14 MED ORDER — POTASSIUM CHLORIDE CRYS ER 20 MEQ PO TBCR: 20.0000 meq | EXTENDED_RELEASE_TABLET | Freq: Once | ORAL | Status: DC

## 2024-09-14 NOTE — Plan of Care (Signed)

## 2024-09-14 NOTE — Progress Notes (Signed)
 PROGRESS NOTE    Deborah Juarez  FMW:[id number]  DOB: [birth year]  DOA: 09/12/2024 PCP: Verena Mems, MD Outpatient Specialists:   Hospital course:  33 year old female with ongoing alcohol use disorder is readmitted for alcoholic pancreatitis presenting as intractable nausea and vomiting.  Patient was last admitted June 2025 for same.   Subjective:  Patient states she feels much better, is no longer vomiting.  Is hoping she can get something to eat.  Has been tolerating clear liquids without difficulty.  Denies abdominal pain.   Objective: Vitals:   09/13/24 2253 09/14/24 0518 09/14/24 0621 09/14/24 1047  BP: 114/73 99/69 116/84 102/74  Pulse: (!) 115 (!) 103  99  Resp: 16 18 16 17   Temp: 99.8 F (37.7 C) 97.9 F (36.6 C) 98.6 F (37 C) 98.3 F (36.8 C)  TempSrc:    Oral  SpO2: 98% 98% 99% 100%  Height:        Intake/Output Summary (Last 24 hours) at 09/14/2024 1754 Last data filed at 09/14/2024 1425 Gross per 24 hour  Intake 2582.19 ml  Output --  Net 2582.19 ml   There were no vitals filed for this visit.   Exam:  General: Well-appearing woman sitting up in bed in good spirits with her mother and her 1-year-old daughter at bedside Eyes: sclera anicteric, conjuctiva mild injection bilaterally CVS: S1-S2, regular  Respiratory: CTA GI: NABS, soft, NT, no epigastric tenderness, no guarding, no rebound LE: Warm and well-perfused Neuro: A/O x 3,  grossly nonfocal.  Psych: patient is logical and coherent, judgement and insight appear normal, mood and affect appropriate to situation.  Data Reviewed:  Basic Metabolic Panel: Recent Labs  Lab 09/12/24 2332 09/13/24 0436 09/14/24 0342  NA 137 134* 138  K 3.3* 4.3 2.7*  CL 81* 89* 97*  CO2 21* 25 31  GLUCOSE 104* 100* 122*  BUN 10 7 <5*  CREATININE 0.77 0.65 0.64  CALCIUM 10.0 8.6* 8.7*  MG  --   --  1.5*  PHOS  --   --  1.7*    CBC: Recent Labs  Lab 09/12/24 2332 09/13/24 0436  09/14/24 0342  WBC 6.4 9.6 4.9  NEUTROABS  --   --  2.3  HGB 13.5 11.1* 10.5*  HCT 39.9 33.8* 32.3*  MCV 94.5 95.5 98.2  PLT 238 179 141*     Scheduled Meds:  chlordiazePOXIDE   25 mg Oral TID   Followed by   NOREEN ON 09/15/2024] chlordiazePOXIDE   25 mg Oral BH-qamhs   Followed by   NOREEN ON 09/16/2024] chlordiazePOXIDE   25 mg Oral Daily   famotidine   40 mg Oral Daily   feeding supplement  1 Container Oral TID BM   multivitamin with minerals  1 tablet Oral Daily   QUEtiapine   50 mg Oral QHS   sodium chloride  flush  3 mL Intravenous Q12H   sodium chloride  flush  3 mL Intravenous Q12H   Continuous Infusions:   Assessment & Plan:   Alcoholic pancreatitis Duodenitis Patient is clinically much improved and would like to eat No further abdominal pain or vomiting However lipase is trended up today Continue conservative management with fluids and clear liquid diet Would advance diet if lipase trends down tomorrow, consider GI consult if it continues to trend up.  Given duodenitis, patient might benefit from EGD either in-house or as an outpatient. Continue Pepcid , consider initiating PPI. 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 Sludge and gallstone in the gallbladder without  evidence of acute cholecystitis. No biliary dilation. Mild hepatic steatosis.   Hypokalemia Hypomagnesemia Replete aggressively and recheck Keep patient on telemetry   Starvation ketosis Anion gap metabolic acidosis Resolved with IV resuscitation resolution of vomiting and clear liquid diet  Ongoing alcohol use disorder with history of complicated withdrawal Patient is on Librium  taper CIWA protocol Patient states she is trying to find a treatment center Will place TOC consult  GAD Continue Seroquel  at bedtime  Asthma As needed Xopenex   DVT prophylaxis: SCD given high risk of bleeding Code Status: Full Family Communication: Mother at bedside  throughout     Studies: CT ABDOMEN WO CONTRAST Result Date: 09/13/2024 EXAM: CT ABDOMEN WITHOUT CONTRAST 09/13/2024 02:13:23 AM TECHNIQUE: CT of the abdomen was performed without the administration of intravenous contrast. Multiplanar reformatted images are provided for review. Automated exposure control, iterative reconstruction, and/or weight based adjustment of the mA/kV was utilized to reduce the radiation dose to as low as reasonably achievable. COMPARISON: 05/16/2024 CLINICAL HISTORY: Pancreatitis. FINDINGS: LOWER CHEST: Visualized portion of the lower chest demonstrates no acute abnormality. HEPATOBILIARY: Severe hepatic steatosis. Cholelithiasis, without pericholecystic inflammatory change. SPLEEN: Spleen demonstrates no acute abnormality. PANCREAS: Pancreatic head fullness without overt inflammatory changes, likely sequelae of prior pancreatitis when correlating with prior CT. ADRENAL GLANDS: Adrenal glands demonstrate no acute abnormality. KIDNEYS: Bilateral punctate nonobstructing renal calculi. No hydronephrosis. No perinephric or periureteral stranding. GI AND BOWEL: Stomach and duodenal sweep demonstrate no acute abnormality. Chronic transverse colonic wall thickening with submucosal fat deposition, suggesting sequelae of prior/chronic inflammation. There is no bowel obstruction. No abnormal bowel wall distension. PERITONEUM AND RETROPERITONEUM: No ascites or free air. Aorta is normal in caliber. LYMPH NODES: No lymphadenopathy. BONES AND SOFT TISSUES: No acute abnormality of the visualized bones. No focal soft tissue abnormality. LIMITATIONS: The pelvis was not imaged. IMPRESSION: 1. Pancreatic head fullness without overt inflammatory changes, consistent with sequelae of prior pancreatitis. 2. Additional ancillary findings, as above. 3. Please note that the pelvis was not imaged. Electronically signed by: Pinkie Pebbles MD 09/13/2024 02:20 AM EDT RP Workstation: [id number]    Principal  Problem:   Alcoholic pancreatitis Active Problems:   Increased anion gap metabolic acidosis   Alcohol withdrawal (HCC)   Generalized anxiety disorder   Chronic alcohol use   History of asthma     Deborah Juarez, [facility]  If 7PM-7AM, please contact night-coverage [email or web address]   LOS: 1 day

## 2024-09-15 DIAGNOSIS — F10939 Alcohol use, unspecified with withdrawal, unspecified: Secondary | ICD-10-CM

## 2024-09-15 DIAGNOSIS — K8521 Alcohol induced acute pancreatitis with uninfected necrosis: Secondary | ICD-10-CM | POA: Diagnosis not present

## 2024-09-15 DIAGNOSIS — K852 Alcohol induced acute pancreatitis without necrosis or infection: Secondary | ICD-10-CM | POA: Diagnosis not present

## 2024-09-15 LAB — COMPREHENSIVE METABOLIC PANEL WITH GFR
ALT: 38 U/L (ref 0–44)
AST: 56 U/L — ABNORMAL HIGH (ref 15–41)
Albumin: 3.8 g/dL (ref 3.5–5.0)
Alkaline Phosphatase: 52 U/L (ref 38–126)
Anion gap: 12 (ref 5–15)
BUN: <5 mg/dL — ABNORMAL LOW (ref 6–20)
CO2: 23 mmol/L (ref 22–32)
Calcium: 9.2 mg/dL (ref 8.9–10.3)
Chloride: 107 mmol/L (ref 98–111)
Creatinine, Ser: 0.6 mg/dL (ref 0.44–1.00)
GFR, Estimated: >60 mL/min (ref >60)
Glucose, Bld: 98 mg/dL (ref 70–99)
Potassium: 3.9 mmol/L (ref 3.5–5.1)
Sodium: 143 mmol/L (ref 135–145)
Total Bilirubin: 0.6 mg/dL (ref 0.0–1.2)
Total Protein: 6.7 g/dL (ref 6.5–8.1)

## 2024-09-15 LAB — LIPASE, BLOOD: Lipase: 1111 U/L — ABNORMAL HIGH (ref 11–51)

## 2024-09-15 LAB — MAGNESIUM: Magnesium: 1.9 mg/dL (ref 1.7–2.4)

## 2024-09-15 MED ORDER — QUETIAPINE FUMARATE 50 MG PO TABS: 50.0000 mg | ORAL_TABLET | Freq: Every day | ORAL | 0 refills | Status: AC

## 2024-09-15 MED ORDER — ONDANSETRON HCL 4 MG PO TABS: 4.0000 mg | ORAL_TABLET | Freq: Four times a day (QID) | ORAL | 0 refills | Status: AC | PRN

## 2024-09-15 MED ORDER — HYDROXYZINE HCL 25 MG PO TABS: 25.0000 mg | ORAL_TABLET | Freq: Three times a day (TID) | ORAL | 0 refills | Status: AC | PRN

## 2024-09-15 NOTE — TOC Transition Note (Addendum)
 Transition of Care [facility]) - Discharge Note   Patient Details  Name: Deborah Juarez MRN: [id number] Date of Birth: 02/22/1991  Transition of Care [facility]) CM/SW Contact:  Alfonse JONELLE Rex, RN Phone Number: 09/15/2024, 10:50 AM   Clinical Narrative:   Met with patient at bedside to introduce role of TOC/NCM and review for dc planning, TOC consult for Substance Abuse , patient agreeable, added to AVS. TOC consult for community resources. Social Engineer, Water Guide added to AVS.  DC to home order. No further TOC needs identified at this time.     Final next level of care: Home/Self Care Barriers to Discharge: No Barriers Identified   Patient Goals and CMS Choice Patient states their goals for this hospitalization and ongoing recovery are:: return home          Discharge Placement                       Discharge Plan and Services Additional resources added to the After Visit Summary for                                       Social Drivers of Health (SDOH) Interventions SDOH Screenings   Food Insecurity: No Food Insecurity (09/13/2024)  Housing: Low Risk  (09/13/2024)  Transportation Needs: No Transportation Needs (09/13/2024)  Utilities: Not At Risk (09/13/2024)  Social Connections: Moderately Isolated (09/13/2024)  Tobacco Use: Low Risk  (09/13/2024)     Readmission Risk Interventions    09/15/2024   10:50 AM  Readmission Risk Prevention Plan  Transportation Screening Complete  PCP or Specialist Appt within 5-7 Days Complete  Home Care Screening Complete  Medication Review (RN CM) Complete

## 2024-09-15 NOTE — Progress Notes (Signed)
Patient discharged to home. Verbalized understanding of all instructions. Escorted to pov via w/c.

## 2024-09-15 NOTE — Discharge Summary (Signed)
 Physician Discharge Summary  Patient: Deborah Juarez FMW:[id number] DOB: Jan 10, 1991   Code Status: Full Code Admit date: 09/12/2024 Discharge date: 09/15/2024 Disposition: Home, No home health services recommended PCP: Verena Mems, MD  Recommendations for Outpatient Follow-up:  Follow up with PCP within 1-2 weeks Regarding general hospital follow up and preventative care  Discharge Diagnoses:  Principal Problem:   Alcoholic pancreatitis Active Problems:   Increased anion gap metabolic acidosis   Alcohol withdrawal (HCC)   Generalized anxiety disorder   Chronic alcohol use   History of asthma  Brief Hospital Course Summary: Deborah Juarez is a 33 year old female with ongoing alcohol use disorder is readmitted for alcoholic pancreatitis presenting as intractable nausea and vomiting. Patient was last admitted June 2025 for same.  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 Sludge and gallstone in the gallbladder without evidence of acute cholecystitis. No biliary dilation. Mild hepatic steatosis.  Lipase mildly elevated to 1,118 max.   Treated with conservative management including antiemetics, IVF, and bowel rest. Pain resolved and she was able to advance her diet without symptoms.  May benefit from GI referral.   Of note, had mild hypokalemia which was resolved with replacement by time of dc.   Refilled her home anxiety and nausea medications.   All other chronic conditions were treated with home medications.    Discharge Condition: Good, improved Recommended discharge diet: Regular healthy diet  Consultations: None   Procedures/Studies: None   Discharge Instructions     Discharge patient   Complete by: As directed    Discharge disposition: 01-Home or Self Care   Discharge patient date: 09/15/2024      Allergies as of 09/15/2024       Reactions   Protonix  [pantoprazole ] Other (See  Comments)   Increased/worsened acid reflux        Medication List     TAKE these medications    hydrOXYzine  25 MG tablet Commonly known as: ATARAX  Take 1 tablet (25 mg total) by mouth 3 (three) times daily as needed.   medroxyPROGESTERone  Acetate 150 MG/ML Susy Inject 1 mL (150 mg total) into the muscle every 3 (three) months.   ondansetron  4 MG tablet Commonly known as: ZOFRAN  Take 1 tablet (4 mg total) by mouth every 6 (six) hours as needed for nausea or vomiting. What changed: reasons to take this   QUEtiapine  50 MG tablet Commonly known as: SEROquel  Take 1 tablet (50 mg total) by mouth at bedtime. What changed:  medication strength how much to take         Subjective   Pt reports feeling well. No nausea, vomiting, or abdominal pain with eating. Feels able to advance diet today and go home.   All questions and concerns were addressed at time of discharge.  Objective  Blood pressure 109/75, pulse (!) 101, temperature 98.7 F (37.1 C), temperature source Oral, resp. rate 18, height 5' 7 (1.702 m), SpO2 100%.   General: Pt is alert, awake, not in acute distress Cardiovascular: RRR, S1/S2 +, no rubs, no gallops Respiratory: CTA bilaterally, no wheezing, no rhonchi Abdominal: Soft, NT, ND, bowel sounds + Extremities: no edema, no cyanosis  The results of significant diagnostics from this hospitalization (including imaging, microbiology, ancillary and laboratory) are listed below for reference.   Imaging studies: CT ABDOMEN WO CONTRAST Result Date: 09/13/2024 EXAM: CT ABDOMEN WITHOUT CONTRAST 09/13/2024 02:13:23 AM TECHNIQUE: CT of the abdomen was performed without the administration of intravenous contrast. Multiplanar  reformatted images are provided for review. Automated exposure control, iterative reconstruction, and/or weight based adjustment of the mA/kV was utilized to reduce the radiation dose to as low as reasonably achievable. COMPARISON: 05/16/2024  CLINICAL HISTORY: Pancreatitis. FINDINGS: LOWER CHEST: Visualized portion of the lower chest demonstrates no acute abnormality. HEPATOBILIARY: Severe hepatic steatosis. Cholelithiasis, without pericholecystic inflammatory change. SPLEEN: Spleen demonstrates no acute abnormality. PANCREAS: Pancreatic head fullness without overt inflammatory changes, likely sequelae of prior pancreatitis when correlating with prior CT. ADRENAL GLANDS: Adrenal glands demonstrate no acute abnormality. KIDNEYS: Bilateral punctate nonobstructing renal calculi. No hydronephrosis. No perinephric or periureteral stranding. GI AND BOWEL: Stomach and duodenal sweep demonstrate no acute abnormality. Chronic transverse colonic wall thickening with submucosal fat deposition, suggesting sequelae of prior/chronic inflammation. There is no bowel obstruction. No abnormal bowel wall distension. PERITONEUM AND RETROPERITONEUM: No ascites or free air. Aorta is normal in caliber. LYMPH NODES: No lymphadenopathy. BONES AND SOFT TISSUES: No acute abnormality of the visualized bones. No focal soft tissue abnormality. LIMITATIONS: The pelvis was not imaged. IMPRESSION: 1. Pancreatic head fullness without overt inflammatory changes, consistent with sequelae of prior pancreatitis. 2. Additional ancillary findings, as above. 3. Please note that the pelvis was not imaged. Electronically signed by: Pinkie Pebbles MD 09/13/2024 02:20 AM EDT RP Workstation: [id number]    Labs: Basic Metabolic Panel: Recent Labs  Lab 09/12/24 2332 09/13/24 0436 09/14/24 0342 09/15/24 0330  NA 137 134* 138 143  K 3.3* 4.3 2.7* 3.9  CL 81* 89* 97* 107  CO2 21* 25 31 23   GLUCOSE 104* 100* 122* 98  BUN 10 7 <5* <5*  CREATININE 0.77 0.65 0.64 0.60  CALCIUM 10.0 8.6* 8.7* 9.2  MG  --   --  1.5* 1.9  PHOS  --   --  1.7*  --    CBC: Recent Labs  Lab 09/12/24 2332 09/13/24 0436 09/14/24 0342  WBC 6.4 9.6 4.9  NEUTROABS  --   --  2.3  HGB 13.5 11.1* 10.5*  HCT  39.9 33.8* 32.3*  MCV 94.5 95.5 98.2  PLT 238 179 141*   Microbiology: Results for orders placed or performed during the hospital encounter of 04/19/23  Gastrointestinal Panel by PCR , Stool     Status: None   Collection Time: 04/19/23  6:54 AM   Specimen: Stool  Result Value Ref Range Status   Campylobacter species NOT DETECTED NOT DETECTED Final   Plesimonas shigelloides NOT DETECTED NOT DETECTED Final   Salmonella species NOT DETECTED NOT DETECTED Final   Yersinia enterocolitica NOT DETECTED NOT DETECTED Final   Vibrio species NOT DETECTED NOT DETECTED Final   Vibrio cholerae NOT DETECTED NOT DETECTED Final   Enteroaggregative E coli (EAEC) NOT DETECTED NOT DETECTED Final   Enteropathogenic E coli (EPEC) NOT DETECTED NOT DETECTED Final   Enterotoxigenic E coli (ETEC) NOT DETECTED NOT DETECTED Final   Shiga like toxin producing E coli (STEC) NOT DETECTED NOT DETECTED Final   Shigella/Enteroinvasive E coli (EIEC) NOT DETECTED NOT DETECTED Final   Cryptosporidium NOT DETECTED NOT DETECTED Final   Cyclospora cayetanensis NOT DETECTED NOT DETECTED Final   Entamoeba histolytica NOT DETECTED NOT DETECTED Final   Giardia lamblia NOT DETECTED NOT DETECTED Final   Adenovirus F40/41 NOT DETECTED NOT DETECTED Final   Astrovirus NOT DETECTED NOT DETECTED Final   Norovirus GI/GII NOT DETECTED NOT DETECTED Final   Rotavirus A NOT DETECTED NOT DETECTED Final   Sapovirus (I, II, IV, and V) NOT DETECTED NOT DETECTED Final  Comment: Performed at [facility], [address] [place], [place] [address]  Resp panel by RT-PCR (RSV, Flu A&B, Covid) Urine, Clean Catch     Status: None   Collection Time: 04/19/23  1:43 PM   Specimen: Urine, Clean Catch; Nasal Swab  Result Value Ref Range Status   SARS Coronavirus 2 by RT PCR NEGATIVE NEGATIVE Final    Comment: (NOTE) SARS-CoV-2 target nucleic acids are NOT DETECTED.  The SARS-CoV-2 RNA is generally detectable in upper  respiratory specimens during the acute phase of infection. The lowest concentration of SARS-CoV-2 viral copies this assay can detect is 138 copies/mL. A negative result does not preclude SARS-Cov-2 infection and should not be used as the sole basis for treatment or other patient management decisions. A negative result may occur with  improper specimen collection/handling, submission of specimen other than nasopharyngeal swab, presence of viral mutation(s) within the areas targeted by this assay, and inadequate number of viral copies(<138 copies/mL). A negative result must be combined with clinical observations, patient history, and epidemiological information. The expected result is Negative.  Fact Sheet for Patients:  bloggercourse.com  Fact Sheet for Healthcare Providers:  seriousbroker.it  This test is no t yet approved or cleared by the [place]  FDA and  has been authorized for detection and/or diagnosis of SARS-CoV-2 by FDA under an Emergency Use Authorization (EUA). This EUA will remain  in effect (meaning this test can be used) for the duration of the COVID-19 declaration under Section 564(b)(1) of the Act, 21 U.S.C.section 360bbb-3(b)(1), unless the authorization is terminated  or revoked sooner.       Influenza A by PCR NEGATIVE NEGATIVE Final   Influenza B by PCR NEGATIVE NEGATIVE Final    Comment: (NOTE) The Xpert Xpress SARS-CoV-2/FLU/RSV plus assay is intended as an aid in the diagnosis of influenza from Nasopharyngeal swab specimens and should not be used as a sole basis for treatment. Nasal washings and aspirates are unacceptable for Xpert Xpress SARS-CoV-2/FLU/RSV testing.  Fact Sheet for Patients: bloggercourse.com  Fact Sheet for Healthcare Providers: seriousbroker.it  This test is not yet approved or cleared by the [place]  FDA and has been  authorized for detection and/or diagnosis of SARS-CoV-2 by FDA under an Emergency Use Authorization (EUA). This EUA will remain in effect (meaning this test can be used) for the duration of the COVID-19 declaration under Section 564(b)(1) of the Act, 21 U.S.C. section 360bbb-3(b)(1), unless the authorization is terminated or revoked.     Resp Syncytial Virus by PCR NEGATIVE NEGATIVE Final    Comment: (NOTE) Fact Sheet for Patients: bloggercourse.com  Fact Sheet for Healthcare Providers: seriousbroker.it  This test is not yet approved or cleared by the [place]  FDA and has been authorized for detection and/or diagnosis of SARS-CoV-2 by FDA under an Emergency Use Authorization (EUA). This EUA will remain in effect (meaning this test can be used) for the duration of the COVID-19 declaration under Section 564(b)(1) of the Act, 21 U.S.C. section 360bbb-3(b)(1), unless the authorization is terminated or revoked.  Performed at [facility], [address], [place], [place] [address]     Time coordinating discharge: Over 30 minutes  Marien LITTIE Piety, MD  [facility] 09/15/2024, 10:27 AM

## 2024-09-15 NOTE — Discharge Instructions (Addendum)
 Please take nausea and anxiety medication as needed.  Slowly advance your diet. Do not eat greasy, fried foods while your body is still recovering and avoid using them at all if possible.  Avoid alcohol use as much as possible as well.  Please follow up with your primary doctor within 1-2 weeks to monitor your recovery

## 2024-10-01 ENCOUNTER — Other Ambulatory Visit: Payer: Self-pay

## 2024-10-01 MED ORDER — MEDROXYPROGESTERONE ACETATE 150 MG/ML IM SUSY
150.0000 mg | PREFILLED_SYRINGE | INTRAMUSCULAR | 0 refills | Status: AC
  Filled 2024-10-01: qty 1, 90d supply, fill #0
